# Patient Record
Sex: Male | Born: 1940 | Race: Black or African American | Hispanic: No | Marital: Married | Smoking: Current every day smoker
Health system: Southern US, Community
[De-identification: ages and names within clinical notes are randomized; demographics above are authoritative.]

## PROBLEM LIST (undated history)

## (undated) DIAGNOSIS — N400 Enlarged prostate without lower urinary tract symptoms: Secondary | ICD-10-CM

## (undated) DIAGNOSIS — M519 Unspecified thoracic, thoracolumbar and lumbosacral intervertebral disc disorder: Secondary | ICD-10-CM

## (undated) DIAGNOSIS — R351 Nocturia: Secondary | ICD-10-CM

## (undated) DIAGNOSIS — M1990 Unspecified osteoarthritis, unspecified site: Secondary | ICD-10-CM

## (undated) DIAGNOSIS — G479 Sleep disorder, unspecified: Secondary | ICD-10-CM

## (undated) DIAGNOSIS — R350 Frequency of micturition: Secondary | ICD-10-CM

## (undated) DIAGNOSIS — I2699 Other pulmonary embolism without acute cor pulmonale: Secondary | ICD-10-CM

## (undated) DIAGNOSIS — Z86718 Personal history of other venous thrombosis and embolism: Secondary | ICD-10-CM

## (undated) HISTORY — PX: LUMBAR LAMINECTOMY: SHX95

---

## 2003-09-24 ENCOUNTER — Emergency Department (HOSPITAL_COMMUNITY): Admission: AC | Admit: 2003-09-24 | Discharge: 2003-09-24 | Payer: Self-pay

## 2003-10-09 ENCOUNTER — Inpatient Hospital Stay (HOSPITAL_COMMUNITY)
Admission: RE | Admit: 2003-10-09 | Discharge: 2003-10-19 | Payer: Self-pay | Admitting: Physical Medicine & Rehabilitation

## 2003-10-09 ENCOUNTER — Ambulatory Visit: Payer: Self-pay | Admitting: Physical Medicine & Rehabilitation

## 2008-06-30 ENCOUNTER — Encounter: Admission: RE | Admit: 2008-06-30 | Discharge: 2008-06-30 | Payer: Self-pay | Admitting: Emergency Medicine

## 2008-07-17 ENCOUNTER — Encounter: Admission: RE | Admit: 2008-07-17 | Discharge: 2008-07-17 | Payer: Self-pay | Admitting: Emergency Medicine

## 2010-02-21 ENCOUNTER — Encounter: Payer: Self-pay | Admitting: Emergency Medicine
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
## 2013-03-12 DIAGNOSIS — R269 Unspecified abnormalities of gait and mobility: Secondary | ICD-10-CM | POA: Diagnosis not present

## 2013-03-12 DIAGNOSIS — M47817 Spondylosis without myelopathy or radiculopathy, lumbosacral region: Secondary | ICD-10-CM | POA: Diagnosis not present

## 2013-03-12 DIAGNOSIS — Z6830 Body mass index (BMI) 30.0-30.9, adult: Secondary | ICD-10-CM | POA: Diagnosis not present

## 2013-03-12 DIAGNOSIS — IMO0002 Reserved for concepts with insufficient information to code with codable children: Secondary | ICD-10-CM | POA: Diagnosis not present

## 2013-03-12 DIAGNOSIS — F17200 Nicotine dependence, unspecified, uncomplicated: Secondary | ICD-10-CM | POA: Diagnosis not present

## 2013-03-12 DIAGNOSIS — R350 Frequency of micturition: Secondary | ICD-10-CM | POA: Diagnosis not present

## 2013-03-12 DIAGNOSIS — M25569 Pain in unspecified knee: Secondary | ICD-10-CM | POA: Diagnosis not present

## 2013-03-12 DIAGNOSIS — M1710 Unilateral primary osteoarthritis, unspecified knee: Secondary | ICD-10-CM | POA: Diagnosis not present

## 2013-03-12 DIAGNOSIS — M899 Disorder of bone, unspecified: Secondary | ICD-10-CM | POA: Diagnosis not present

## 2013-03-12 DIAGNOSIS — M25469 Effusion, unspecified knee: Secondary | ICD-10-CM | POA: Diagnosis not present

## 2013-11-10 ENCOUNTER — Emergency Department (HOSPITAL_COMMUNITY)
Admission: EM | Admit: 2013-11-10 | Discharge: 2013-11-11 | Disposition: A | Discharge status: Home / Self Care | Payer: 59 | Attending: Emergency Medicine | Admitting: Emergency Medicine

## 2013-11-10 ENCOUNTER — Encounter (HOSPITAL_COMMUNITY): Payer: Self-pay | Admitting: Emergency Medicine

## 2013-11-10 DIAGNOSIS — Z720 Tobacco use: Secondary | ICD-10-CM | POA: Diagnosis not present

## 2013-11-10 DIAGNOSIS — N453 Epididymo-orchitis: Secondary | ICD-10-CM | POA: Diagnosis not present

## 2013-11-10 DIAGNOSIS — N3001 Acute cystitis with hematuria: Secondary | ICD-10-CM

## 2013-11-10 DIAGNOSIS — N50811 Right testicular pain: Secondary | ICD-10-CM

## 2013-11-10 DIAGNOSIS — N508 Other specified disorders of male genital organs: Secondary | ICD-10-CM | POA: Diagnosis present

## 2013-11-10 LAB — CBC WITH DIFFERENTIAL/PLATELET
BASOS ABS: 0 10*3/uL (ref 0.0–0.1)
BASOS PCT: 1 % (ref 0–1)
EOS ABS: 0.1 10*3/uL (ref 0.0–0.7)
Eosinophils Relative: 3 % (ref 0–5)
HCT: 40.7 % (ref 39.0–52.0)
HEMOGLOBIN: 14.2 g/dL (ref 13.0–17.0)
Lymphocytes Relative: 21 % (ref 12–46)
Lymphs Abs: 0.9 10*3/uL (ref 0.7–4.0)
MCH: 31.8 pg (ref 26.0–34.0)
MCHC: 34.9 g/dL (ref 30.0–36.0)
MCV: 91.1 fL (ref 78.0–100.0)
MONO ABS: 0.9 10*3/uL (ref 0.1–1.0)
Monocytes Relative: 20 % — ABNORMAL HIGH (ref 3–12)
NEUTROS ABS: 2.5 10*3/uL (ref 1.7–7.7)
NEUTROS PCT: 55 % (ref 43–77)
Platelets: DECREASED 10*3/uL (ref 150–400)
RBC: 4.47 MIL/uL (ref 4.22–5.81)
RDW: 13.1 % (ref 11.5–15.5)
WBC: 4.4 10*3/uL (ref 4.0–10.5)

## 2013-11-10 LAB — URINALYSIS, ROUTINE W REFLEX MICROSCOPIC
GLUCOSE, UA: NEGATIVE mg/dL
Ketones, ur: NEGATIVE mg/dL
Nitrite: NEGATIVE
PROTEIN: 100 mg/dL — AB
SPECIFIC GRAVITY, URINE: 1.042 — AB (ref 1.005–1.030)
UROBILINOGEN UA: 1 mg/dL (ref 0.0–1.0)
pH: 5.5 (ref 5.0–8.0)

## 2013-11-10 LAB — URINE MICROSCOPIC-ADD ON

## 2013-11-10 LAB — BASIC METABOLIC PANEL
ANION GAP: 14 (ref 5–15)
BUN: 18 mg/dL (ref 6–23)
CALCIUM: 9.1 mg/dL (ref 8.4–10.5)
CHLORIDE: 98 meq/L (ref 96–112)
CO2: 23 mEq/L (ref 19–32)
CREATININE: 0.93 mg/dL (ref 0.50–1.35)
GFR calc non Af Amer: 81 mL/min — ABNORMAL LOW (ref >90)
Glucose, Bld: 116 mg/dL — ABNORMAL HIGH (ref 70–99)
Potassium: 4.2 mEq/L (ref 3.7–5.3)
Sodium: 135 mEq/L — ABNORMAL LOW (ref 137–147)

## 2013-11-10 NOTE — ED Notes (Signed)
Pt c/o rt testicular pain that began on Wed; pt states that the pain radiates to his rt groin; pt states that it has been difficult to walk due to pain; pt with obvious swelling to rt testicle; tender to palpation

## 2013-11-11 ENCOUNTER — Emergency Department (HOSPITAL_COMMUNITY): Payer: 59

## 2013-11-11 DIAGNOSIS — N453 Epididymo-orchitis: Secondary | ICD-10-CM | POA: Diagnosis not present

## 2013-11-11 MED ORDER — LEVOFLOXACIN 500 MG PO TABS
750.0000 mg | ORAL_TABLET | Freq: Once | ORAL | Status: AC
  Administered 2013-11-11: 750 mg via ORAL
  Filled 2013-11-11: qty 2

## 2013-11-11 MED ORDER — LEVOFLOXACIN 750 MG PO TABS: 750.0000 mg | ORAL_TABLET | Freq: Every day | ORAL | Status: DC

## 2013-11-11 NOTE — Discharge Instructions (Signed)
Urinary Tract Infection [name] [name], you were seen today for testicular pain and swelling.  You have an infection in your urine and scrotum.  Take medication for 10 days for treatment and followup with a primary care physician within 3 days. Return to emergency department if any of her symptoms worsen. Thank you. A urinary tract infection (UTI) can occur any place along the urinary tract. The tract includes the kidneys, ureters, bladder, and urethra. A type of germ called bacteria often causes a UTI. UTIs are often helped with antibiotic medicine.  HOME CARE   If given, take antibiotics as told by your doctor. Finish them even if you start to feel better.  Drink enough fluids to keep your pee (urine) clear or pale yellow.  Avoid tea, drinks with caffeine, and bubbly (carbonated) drinks.  Pee often. Avoid holding your pee in for a long time.  Pee before and after having sex (intercourse).  Wipe from front to back after you poop (bowel movement) if you are a woman. Use each tissue only once. GET HELP RIGHT AWAY IF:   You have back pain.  You have lower belly (abdominal) pain.  You have chills.  You feel sick to your stomach (nauseous).  You throw up (vomit).  Your burning or discomfort with peeing does not go away.  You have a fever.  Your symptoms are not better in 3 days. MAKE SURE YOU:   Understand these instructions.  Will watch your condition.  Will get help right away if you are not doing well or get worse. Document Released: 07/05/2007 Document Revised: 10/11/2011 Document Reviewed: 08/17/2011 [facility] Patient Information 2015 [place], [place]. This information is not intended to replace advice given to you by your health care provider. Make sure you discuss any questions you have with your health care provider. Orchitis Orchitis is an infection of the testicle of usually sudden onset (happens quickly). It may be viral or bacterial (caused by germs). Usually with this  illness there is generalized malaise (not feeling well) and fever. There is also pain. There is usually tenderness and swelling of the scrotum and testicle. DIAGNOSIS  Your caregiver will perform an exam to make sure there is not another reason for the pain in your testicle. A rectal exam may be done to find out if the prostate is swollen and tender. Blood work may be done to see if your white blood cell count is elevated. This can help determine if an infection is viral or bacterial. A urinalysis can also determine what type of infection is present. Most bacterial infections can be treated with antibiotics (medications which kill germs). LET YOUR CAREGIVER KNOW ABOUT:  Allergies.  Medications taken including herbs, eye drops, over the counter medications, and creams.  Use of steroids (by mouth or creams).  Previous problems with anesthetics or novocaine.  Previous prostate infections.  History of blood clots (thrombophlebitis).  History of bleeding or blood problems.  Previous surgery.  Previous urinary tract infection.  Other health problems. HOME CARE INSTRUCTIONS   Apply cold packs to the scrotal area for twenty minutes, four times per day or as needed.  A scrotal support may be helpful. Keep a small pillow or support under your testicles while lying or sitting down.  Only take over-the-counter or prescription medicines for pain, discomfort, or fever as directed by your caregiver.  Take all medications, including antibiotics, as directed. Take the antibiotics for the full prescribed length of time even if you are feeling better. SEEK  IMMEDIATE MEDICAL CARE IF:   Your redness, swelling, or pain in the testicle increases or is not getting better.  You have a fever.  You have pain not relieved with medicines.  You have any worsening of any symptoms (problems) that originally brought you in for medical care. Document Released: 01/14/2000 Document Revised: 04/10/2011 Document  Reviewed: 01/16/2005 [facility] Patient Information 2015 [place], [place]. This information is not intended to replace advice given to you by your health care provider. Make sure you discuss any questions you have with your health care provider. Epididymitis Epididymitis is a swelling (inflammation) of the epididymis. The epididymis is a cord-like structure along the back part of the testicle. Epididymitis is usually, but not always, caused by infection. This is usually a sudden problem beginning with chills, fever and pain behind the scrotum and in the testicle. There may be swelling and redness of the testicle. DIAGNOSIS  Physical examination will reveal a tender, swollen epididymis. Sometimes, cultures are obtained from the urine or from prostate secretions to help find out if there is an infection or if the cause is a different problem. Sometimes, blood work is performed to see if your white blood cell count is elevated and if a germ (bacterial) or viral infection is present. Using this knowledge, an appropriate medicine which kills germs (antibiotic) can be chosen by your caregiver. A viral infection causing epididymitis will most often go away (resolve) without treatment. HOME CARE INSTRUCTIONS   Hot sitz baths for 20 minutes, 4 times per day, may help relieve pain.  Only take over-the-counter or prescription medicines for pain, discomfort or fever as directed by your caregiver.  Take all medicines, including antibiotics, as directed. Take the antibiotics for the full prescribed length of time even if you are feeling better.  It is very important to keep all follow-up appointments. SEEK IMMEDIATE MEDICAL CARE IF:   You have a fever.  You have pain not relieved with medicines.  You have any worsening of your problems.  Your pain seems to come and go.  You develop pain, redness, and swelling in the scrotum and surrounding areas. MAKE SURE YOU:   Understand these instructions.  Will  watch your condition.  Will get help right away if you are not doing well or get worse. Document Released: 01/14/2000 Document Revised: 04/10/2011 Document Reviewed: 12/03/2008 [facility] Patient Information 2015 [place], [place]. This information is not intended to replace advice given to you by your health care provider. Make sure you discuss any questions you have with your health care provider.  Emergency Department Resource Guide 1) Find a Doctor and Pay Out of Pocket Although you won't have to find out who is covered by your insurance plan, it is a good idea to ask around and get recommendations. You will then need to call the office and see if the doctor you have chosen will accept you as a new patient and what types of options they offer for patients who are self-pay. Some doctors offer discounts or will set up payment plans for their patients who do not have insurance, but you will need to ask so you aren't surprised when you get to your appointment.  2) Contact Your Local Health Department Not all health departments have doctors that can see patients for sick visits, but many do, so it is worth a call to see if yours does. If you don't know where your local health department is, you can check in your phone book. The CDC also has  a tool to help you locate your state's health department, and many state websites also have listings of all of their local health departments.  3) Find a [facility] If your illness is not likely to be very severe or complicated, you may want to try a walk in clinic. These are popping up all over the country in pharmacies, drugstores, and shopping centers. They're usually staffed by nurse practitioners or physician assistants that have been trained to treat common illnesses and complaints. They're usually fairly quick and inexpensive. However, if you have serious medical issues or chronic medical problems, these are probably not your best option.  No Primary Care  Doctor: - Call Health Connect at  [phone] - they can help you locate a primary care doctor that  accepts your insurance, provides certain services, etc. - Physician Referral Service- [phone]  Chronic Pain Problems: Organization         Address  Phone   Notes  [facility]  [phone] Patients need to be referred by their primary care doctor.   Medication Assistance: Organization         Address  Phone   Notes  [facility] Medication [facility] [address] [place], [place] [address] [phone] --Must be a resident of [facility] -- Must have NO insurance coverage whatsoever (no Medicaid/ Medicare, etc.) -- The pt. MUST have a primary care doctor that directs their care regularly and follows them in the community   MedAssist  [phone]   [employer]  [phone]    Agencies that provide inexpensive medical care: Organization         Address  Phone   Notes  [place]  [phone]   [facility]    [phone]   [facility] [place], [place] [address] [phone]   [place] [address], [place] [phone]   Planned Parenthood    [phone]   [facility]    [phone]   [place] and [place] [address], [place] Phone:  [phone], Fax:  [phone] Hours of Operation:  9 am - 6 pm, M-F.  Also accepts Medicaid/Medicare and self-pay.  [facility] for [place] [address], [address], [place] Phone: [phone], Fax: [phone]. Hours of Operation:  8:30 am - 5:30 pm, M-F.  Also accepts Medicaid and self-pay.  [facility] [place] [address] [place] Phone: [phone]   [place], [place], [place] [phone], Ext. [phone] Mondays & Thursdays: 7-9 AM.  First 15 patients are seen on a first  come, first serve basis.    [place] Providers:  Organization         Address  Phone   Notes  [facility] [address], [place] [phone] Also accepts self-pay patients.  [facility] [address], [place]  [phone]   [place], [address], [place] [phone]   [facility] [address], [place] [phone]   Lucianne Lei [address], [place]   [phone] Only accepts [place] Access [place] patients after they have their name applied to their card.   Self-Pay (no insurance) in [place]  [place]:  Organization         Address  Phone   Notes  Sickle Cell Patients, [facility] Internal Medicine [place] [phone]   [facility] [place] [phone]   [facility] [place]  [place], [address], [place] [phone]   [facility]/Dr. Osei-Bonsu  [address] [place] or [address], [place] [phone] Phone number for both [place] and [place] locations is the same.  [facility] [address] [place] [phone]   [facility] [address], [place] or [address] [phone]   [facility] [address] [place] [phone], phone; [phone], fax Sees patients 1st and 3rd Saturday of every month.  Must not qualify for public or private insurance (i.e. Medicaid, Medicare, [place] Health Choice, Veterans' Benefits)  Household income should be no more than 200% of the poverty level The clinic cannot treat you if you are pregnant or think you are pregnant  Sexually transmitted diseases are not treated at the clinic.    Dental Care: Organization          Address  Phone  Notes  [facility] Department of [facility] [place] [phone] Accepts children up to age 9 who are enrolled in [place] or [place]; pregnant women with a Medicaid card; and children who have applied for Medicaid or [place] Health Choice, but were declined, whose parents can pay a reduced fee at time of service.  [facility] Department of [facility]  [address], [place] [phone] Accepts children up to age 57 who are enrolled in [place] or [place]; pregnant women with a Medicaid card; and children who have applied for Medicaid or  Health Choice, but were declined, whose parents can pay a reduced fee at time of service.  [place] Adult Dental Access PROGRAM  [place] [phone] Patients are seen by appointment only. Walk-ins are not accepted. [place] will see patients 21 years of age and older. Monday - Tuesday (8am-5pm) Most Wednesdays (8:30-5pm) $30 per visit, cash only  [facility] Adult [facility] Access PRO[facility]  [address], [facility] [phone] Patients are seen by appointment only. Walk-ins are not accepted. [place] will see patients 76 years of age and older. One Wednesday Evening (Monthly: Volunteer Based).  $30 per visit, cash only  [place]  [phone] for adults; Children under age 6, call Graduate Pediatric Dentistry at [phone]. Children aged 7-14, please call [phone] to request a pediatric application.  Dental services are provided in all areas of dental care including fillings, crowns and bridges, complete and partial dentures, implants, gum treatment, root canals, and extractions. Preventive care is also provided. Treatment is provided to both adults and children. Patients are selected via a lottery and there is often a waiting list.   [facility] [place] [address] [place]  [phone] [email or web address]   [facility] [address] [place], [place] [phone], Ext. [phone] Second and Fourth Thursday of each month, opens at 6:30 AM; Clinic ends at 9 AM.  Patients are seen on a first-come first-served basis, and a limited number are seen during each clinic.  [facility]  [address] Hillard Danker [place], [place] [phone]   Eligibility Requirements You must have lived in [place], [place], or [place] counties for at least the last three months.   You cannot be eligible for state or federal sponsored [employer], including [employer], [place], or [employer].   You generally cannot be eligible for healthcare insurance through your employer.    How to apply: Eligibility screenings are held every Tuesday and Wednesday afternoon from 1:00 pm until 4:00 pm. You do not need an appointment for the interview!  [facility] [address] [place], [place]   [place]  [place] Department  Parks  [phone]    Behavioral Health Resources in the Community: Intensive Outpatient Programs Organization         Address  Phone  Notes  [facility] [place]. [address], [place], [place] [phone]   [facility] [address], [place], [place]   ADS: Alcohol & Drug Svcs [address] [place], [place]   [place] [address],  [place], [place] or [phone]   Substance Abuse Resources Organization         Address  Phone  Notes  Alcohol and Drug Services  [phone]   [place]  [phone]   [facility] [place]   Chinita Pester  [phone]   Residential & Outpatient Substance Abuse Program  [phone]   Psychological  Services Organization         Address  Phone  Notes  [facility] [place] [facility]  [place]  [phone]   [place] [address] [place] or [phone]    Mobile Crisis Teams Organization         Address  Phone  Notes  [facility]atives, Mobile Crisis Care Unit  [phone]   [facility]  [address] [place], [place]   [place] Levels [address] [place] [phone]    Self-Help/Support Groups Organization         Address  Phone             Notes  [place]. of [place] - variety of support groups  [place] Call for more information  Narcotics Anonymous (NA), [facility] [address], [employer]  2 meetings at this location   Special educational needs teacher         Address  Phone  Notes  ASAP Residential Treatment [place],    [place]  [phone]   [facility]  [address] [place] [phone], [place], [place]   [place], [place] [phone] Admissions: 8am-3pm M-F  Incentives Substance [place] [address]    [place], [place] [phone]   [facility] [address] Jadene Pierini [place], [place]   [facility] [address]  [place], [place]   [facility] Programs - Intensive Outpatient [address], [place], [place]   [facility] ([place].) [place].,  [place], [place] or [phone]   [facility]idential Treatment Services (RTS) [address] [place], [place] Accepts Medicaid  [facility] [place] [address]  [place] [phone] Substance Abuse/Addiction Treatment  [facility] Organization         Address  Phone  Notes  [facility]  [phone]   Domenic Schwab, PhD [address] [place], [place]   [phone] or [phone]   [place], [place] [phone]   [address], [place], [place] [phone] Insurance/Medicaid/sponsorship through [facility] and Families [address]                                    [place], [place] [phone] [place] [address], [place] [phone]    Dr. Adele Schilder  [phone]   [facility] Dept. 1) [address], [place] 2) [place] 3)  [place] [address], [place] [phone]  [phone]   [place] [phone] or [phone] (After Hours)

## 2013-11-11 NOTE — ED Provider Notes (Signed)
CSN: [id number]     Arrival date & time 11/10/13  2019 History   First MD Initiated Contact with Patient 11/11/13 0426     Chief Complaint  Patient presents with  . Testicle Pain     (Consider location/radiation/quality/duration/timing/severity/associated sxs/prior Treatment) HPI [name] [name] is a 73 y.o. male with no significant past medical history coming in with right testicular pain and swelling. Patient states this began one week ago. It is progressively gotten worse. He says he has hematuria as well. He denies dysuria or change in frequency. He denies any penile discharge. He's had some mild lower abdominal pain as well. Nothing makes his pain better or worse. He has no chest pain or shortness of breath, he denies any fevers. Patient had no recent infections. He has no further complaints.  10 Systems reviewed and are negative for acute change except as noted in the HPI.     History reviewed. No pertinent past medical history. Past Surgical History  Procedure Laterality Date  . Back surgery     No family history on file. History  Substance Use Topics  . Smoking status: Current Every Day Smoker -- 1.50 packs/day    Types: Cigarettes  . Smokeless tobacco: Not on file  . Alcohol Use: No    Review of Systems    Allergies  Review of patient's allergies indicates no known allergies.  Home Medications   Prior to Admission medications   Medication Sig Start Date End Date Taking? Authorizing Provider  ibuprofen (ADVIL,MOTRIN) 200 MG tablet Take 400 mg by mouth every 6 (six) hours as needed for moderate pain.   Yes Historical Provider, MD   BP 138/80  Pulse 85  Temp(Src) 98.4 F (36.9 C) (Oral)  Resp 18  Ht 6' (1.829 m)  Wt 210 lb (95.255 kg)  BMI 28.47 kg/m2  SpO2 97% Physical Exam  Nursing note and vitals reviewed. Constitutional: He is oriented to person, place, and time. Vital signs are normal. He appears well-developed and well-nourished.  Non-toxic  appearance. He does not appear ill. No distress.  HENT:  Head: Normocephalic and atraumatic.  Nose: Nose normal.  Mouth/Throat: Oropharynx is clear and moist. No oropharyngeal exudate.  Eyes: Conjunctivae and EOM are normal. Pupils are equal, round, and reactive to light. No scleral icterus.  Neck: Normal range of motion. Neck supple. No tracheal deviation, no edema, no erythema and normal range of motion present. No mass and no thyromegaly present.  Cardiovascular: Normal rate, regular rhythm, S1 normal, S2 normal, normal heart sounds, intact distal pulses and normal pulses.  Exam reveals no gallop and no friction rub.   No murmur heard. Pulses:      Radial pulses are 2+ on the right side, and 2+ on the left side.       Dorsalis pedis pulses are 2+ on the right side, and 2+ on the left side.  Pulmonary/Chest: Effort normal and breath sounds normal. No respiratory distress. He has no wheezes. He has no rhonchi. He has no rales.  Abdominal: Soft. Normal appearance and bowel sounds are normal. He exhibits no distension, no ascites and no mass. There is no hepatosplenomegaly. There is no tenderness. There is no rebound, no guarding and no CVA tenderness.  No bilateral lower quadrant tenderness to palpation  Genitourinary: Penis normal. No penile tenderness.  Right testicular swelling and tenderness to palpation. It is warm to touch. There is right epididymal tenderness as well. No left-sided testicular swelling or tenderness.  Musculoskeletal: Normal  range of motion. He exhibits no edema and no tenderness.  Lymphadenopathy:    He has no cervical adenopathy.  Neurological: He is alert and oriented to person, place, and time. He has normal strength. No cranial nerve deficit or sensory deficit. He exhibits normal muscle tone. GCS eye subscore is 4. GCS verbal subscore is 5. GCS motor subscore is 6.  Skin: Skin is warm, dry and intact. No petechiae and no rash noted. He is not diaphoretic. No erythema.  No pallor.  Psychiatric: He has a normal mood and affect. His behavior is normal. Judgment normal.    ED Course  Procedures (including critical care time) Labs Review Labs Reviewed  BASIC METABOLIC PANEL - Abnormal; Notable for the following:    Sodium 135 (*)    Glucose, Bld 116 (*)    GFR calc non Af Amer 81 (*)    All other components within normal limits  CBC WITH DIFFERENTIAL - Abnormal; Notable for the following:    Monocytes Relative 20 (*)    All other components within normal limits  URINALYSIS, ROUTINE W REFLEX MICROSCOPIC - Abnormal; Notable for the following:    Color, Urine RED (*)    APPearance TURBID (*)    Specific Gravity, Urine 1.042 (*)    Hgb urine dipstick LARGE (*)    Bilirubin Urine MODERATE (*)    Protein, ur 100 (*)    Leukocytes, UA SMALL (*)    All other components within normal limits  URINE MICROSCOPIC-ADD ON - Abnormal; Notable for the following:    Bacteria, UA MANY (*)    All other components within normal limits    Imaging Review US Scrotum  11/11/2013   CLINICAL DATA:  Right testicle pain initial encounter  EXAM: SCROTAL ULTRASOUND  DOPPLER ULTRASOUND OF THE TESTICLES  TECHNIQUE: Complete ultrasound examination of the testicles, epididymis, and other scrotal structures was performed. Color and spectral Doppler ultrasound were also utilized to evaluate blood flow to the testicles.  COMPARISON:  None.  FINDINGS: Right testicle  Measurements: 4.7 x 3.6 x 3.8 cm. Echotexture is mildly heterogeneous, likely from edema. The testicle is hypervascular. No evidence of abscess. No evidence of mass.  Left testicle  Measurements: 4.4 x 1.9 x 2.7 cm. No mass or microlithiasis visualized.  Right epididymis: Enlarged and heterogeneous with hyperemia. No evidence of abscess.  Left epididymis:  Normal in size and appearance.  Hydrocele:  None visualized.  Varicocele:  None visualized.  Pulsed Doppler interrogation of both testes demonstrates low resistance arterial  and venous waveforms bilaterally. On the right, diastolic flow is increased in the setting of hyperemia.  IMPRESSION: Right epididymo-orchitis.   Electronically Signed   By: Jorje Guild M.D.   On: 11/11/2013 02:11   [place] Art/ven Flow Abd Pelv Doppler  11/11/2013   CLINICAL DATA:  Right testicle pain initial encounter  EXAM: SCROTAL ULTRASOUND  DOPPLER ULTRASOUND OF THE TESTICLES  TECHNIQUE: Complete ultrasound examination of the testicles, epididymis, and other scrotal structures was performed. Color and spectral Doppler ultrasound were also utilized to evaluate blood flow to the testicles.  COMPARISON:  None.  FINDINGS: Right testicle  Measurements: 4.7 x 3.6 x 3.8 cm. Echotexture is mildly heterogeneous, likely from edema. The testicle is hypervascular. No evidence of abscess. No evidence of mass.  Left testicle  Measurements: 4.4 x 1.9 x 2.7 cm. No mass or microlithiasis visualized.  Right epididymis: Enlarged and heterogeneous with hyperemia. No evidence of abscess.  Left epididymis:  Normal in size  and appearance.  Hydrocele:  None visualized.  Varicocele:  None visualized.  Pulsed Doppler interrogation of both testes demonstrates low resistance arterial and venous waveforms bilaterally. On the right, diastolic flow is increased in the setting of hyperemia.  IMPRESSION: Right epididymo-orchitis.   Electronically Signed   By: Jorje Guild M.D.   On: 11/11/2013 02:11     EKG Interpretation None      MDM   Final diagnoses:  Pain in right testicle    Patient presents emergency department for pain and swelling of his right testicle. Ultrasound reveals epididymal orchitis, urinalysis also reveals a urinary tract infection with hematuria. We'll treat for both. Patient was given Levaquin 7:15 emergency department. We'll discharge the ten-day course. His vital signs remain within his normal limits is safe for discharge.    Everlene Balls, MD 11/11/13 [phone]

## 2013-11-20 DIAGNOSIS — N451 Epididymitis: Secondary | ICD-10-CM | POA: Diagnosis not present

## 2014-01-08 DIAGNOSIS — N451 Epididymitis: Secondary | ICD-10-CM | POA: Diagnosis not present

## 2014-01-22 ENCOUNTER — Inpatient Hospital Stay (HOSPITAL_COMMUNITY): Payer: 59 | Admitting: Anesthesiology

## 2014-01-22 ENCOUNTER — Inpatient Hospital Stay (HOSPITAL_COMMUNITY)
Admission: EM | Admit: 2014-01-22 | Discharge: 2014-01-26 | DRG: 482 | Disposition: A | Discharge status: Skilled Nursing Facility | Payer: 59 | Attending: Internal Medicine | Admitting: Internal Medicine

## 2014-01-22 ENCOUNTER — Encounter (HOSPITAL_COMMUNITY)
Admission: EM | Disposition: A | Discharge status: Skilled Nursing Facility | Payer: Self-pay | Source: Home / Self Care | Attending: Internal Medicine

## 2014-01-22 ENCOUNTER — Encounter (HOSPITAL_COMMUNITY): Payer: Self-pay | Admitting: Emergency Medicine

## 2014-01-22 ENCOUNTER — Inpatient Hospital Stay (HOSPITAL_COMMUNITY): Payer: 59

## 2014-01-22 ENCOUNTER — Emergency Department (HOSPITAL_COMMUNITY): Payer: 59

## 2014-01-22 DIAGNOSIS — S72146A Nondisplaced intertrochanteric fracture of unspecified femur, initial encounter for closed fracture: Secondary | ICD-10-CM | POA: Diagnosis not present

## 2014-01-22 DIAGNOSIS — R338 Other retention of urine: Secondary | ICD-10-CM | POA: Diagnosis not present

## 2014-01-22 DIAGNOSIS — Z8781 Personal history of (healed) traumatic fracture: Secondary | ICD-10-CM

## 2014-01-22 DIAGNOSIS — S72009A Fracture of unspecified part of neck of unspecified femur, initial encounter for closed fracture: Secondary | ICD-10-CM | POA: Diagnosis present

## 2014-01-22 DIAGNOSIS — S72142A Displaced intertrochanteric fracture of left femur, initial encounter for closed fracture: Secondary | ICD-10-CM | POA: Diagnosis not present

## 2014-01-22 DIAGNOSIS — R52 Pain, unspecified: Secondary | ICD-10-CM | POA: Diagnosis not present

## 2014-01-22 DIAGNOSIS — F17210 Nicotine dependence, cigarettes, uncomplicated: Secondary | ICD-10-CM | POA: Diagnosis present

## 2014-01-22 DIAGNOSIS — W19XXXA Unspecified fall, initial encounter: Secondary | ICD-10-CM | POA: Diagnosis present

## 2014-01-22 DIAGNOSIS — S72002A Fracture of unspecified part of neck of left femur, initial encounter for closed fracture: Secondary | ICD-10-CM | POA: Diagnosis present

## 2014-01-22 DIAGNOSIS — Z9889 Other specified postprocedural states: Secondary | ICD-10-CM

## 2014-01-22 DIAGNOSIS — M25559 Pain in unspecified hip: Secondary | ICD-10-CM | POA: Diagnosis not present

## 2014-01-22 DIAGNOSIS — Z419 Encounter for procedure for purposes other than remedying health state, unspecified: Secondary | ICD-10-CM

## 2014-01-22 DIAGNOSIS — N400 Enlarged prostate without lower urinary tract symptoms: Secondary | ICD-10-CM | POA: Diagnosis present

## 2014-01-22 DIAGNOSIS — R339 Retention of urine, unspecified: Secondary | ICD-10-CM | POA: Diagnosis not present

## 2014-01-22 DIAGNOSIS — N401 Enlarged prostate with lower urinary tract symptoms: Secondary | ICD-10-CM | POA: Diagnosis present

## 2014-01-22 DIAGNOSIS — M25552 Pain in left hip: Secondary | ICD-10-CM | POA: Diagnosis not present

## 2014-01-22 DIAGNOSIS — S72143A Displaced intertrochanteric fracture of unspecified femur, initial encounter for closed fracture: Secondary | ICD-10-CM | POA: Diagnosis not present

## 2014-01-22 HISTORY — PX: INTRAMEDULLARY (IM) NAIL INTERTROCHANTERIC: SHX5875

## 2014-01-22 LAB — CBC
HCT: 35.4 % — ABNORMAL LOW (ref 39.0–52.0)
HEMOGLOBIN: 12 g/dL — AB (ref 13.0–17.0)
MCH: 31.3 pg (ref 26.0–34.0)
MCHC: 33.9 g/dL (ref 30.0–36.0)
MCV: 92.4 fL (ref 78.0–100.0)
Platelets: UNDETERMINED 10*3/uL (ref 150–400)
RBC: 3.83 MIL/uL — AB (ref 4.22–5.81)
RDW: 14.2 % (ref 11.5–15.5)
WBC: 5.5 10*3/uL (ref 4.0–10.5)

## 2014-01-22 LAB — CBC WITH DIFFERENTIAL/PLATELET
BASOS ABS: 0 10*3/uL (ref 0.0–0.1)
BASOS PCT: 0 % (ref 0–1)
Eosinophils Absolute: 0 10*3/uL (ref 0.0–0.7)
Eosinophils Relative: 0 % (ref 0–5)
HEMATOCRIT: 39.5 % (ref 39.0–52.0)
HEMOGLOBIN: 13.5 g/dL (ref 13.0–17.0)
LYMPHS ABS: 0.8 10*3/uL (ref 0.7–4.0)
Lymphocytes Relative: 8 % — ABNORMAL LOW (ref 12–46)
MCH: 31.7 pg (ref 26.0–34.0)
MCHC: 34.2 g/dL (ref 30.0–36.0)
MCV: 92.7 fL (ref 78.0–100.0)
MONO ABS: 0.8 10*3/uL (ref 0.1–1.0)
Monocytes Relative: 8 % (ref 3–12)
NEUTROS ABS: 8 10*3/uL — AB (ref 1.7–7.7)
Neutrophils Relative %: 84 % — ABNORMAL HIGH (ref 43–77)
Platelets: UNDETERMINED 10*3/uL (ref 150–400)
RBC: 4.26 MIL/uL (ref 4.22–5.81)
RDW: 14.2 % (ref 11.5–15.5)
WBC: 9.6 10*3/uL (ref 4.0–10.5)

## 2014-01-22 LAB — COMPREHENSIVE METABOLIC PANEL
ALBUMIN: 3.8 g/dL (ref 3.5–5.2)
ALK PHOS: 49 U/L (ref 39–117)
ALT: 13 U/L (ref 0–53)
ANION GAP: 7 (ref 5–15)
AST: 15 U/L (ref 0–37)
BUN: 14 mg/dL (ref 6–23)
CALCIUM: 8.8 mg/dL (ref 8.4–10.5)
CO2: 28 mmol/L (ref 19–32)
CREATININE: 0.89 mg/dL (ref 0.50–1.35)
Chloride: 101 mEq/L (ref 96–112)
GFR calc non Af Amer: 82 mL/min — ABNORMAL LOW (ref >90)
GLUCOSE: 115 mg/dL — AB (ref 70–99)
Potassium: 3.8 mmol/L (ref 3.5–5.1)
Sodium: 136 mmol/L (ref 135–145)
TOTAL PROTEIN: 7.5 g/dL (ref 6.0–8.3)
Total Bilirubin: 0.4 mg/dL (ref 0.3–1.2)

## 2014-01-22 LAB — TYPE AND SCREEN
ABO/RH(D): A POS
Antibody Screen: NEGATIVE

## 2014-01-22 LAB — I-STAT CHEM 8, ED
BUN: 13 mg/dL (ref 6–23)
CREATININE: 0.9 mg/dL (ref 0.50–1.35)
Calcium, Ion: 1.17 mmol/L (ref 1.13–1.30)
Chloride: 101 mEq/L (ref 96–112)
GLUCOSE: 112 mg/dL — AB (ref 70–99)
HCT: 42 % (ref 39.0–52.0)
Hemoglobin: 14.3 g/dL (ref 13.0–17.0)
Potassium: 3.8 mmol/L (ref 3.5–5.1)
SODIUM: 139 mmol/L (ref 135–145)
TCO2: 23 mmol/L (ref 0–100)

## 2014-01-22 LAB — CREATININE, SERUM
Creatinine, Ser: 0.94 mg/dL (ref 0.50–1.35)
GFR calc Af Amer: >90 mL/min (ref >90)
GFR calc non Af Amer: 80 mL/min — ABNORMAL LOW (ref >90)

## 2014-01-22 LAB — ABO/RH: ABO/RH(D): A POS

## 2014-01-22 LAB — PROTIME-INR
INR: 1.03 (ref 0.00–1.49)
PROTHROMBIN TIME: 13.6 s (ref 11.6–15.2)

## 2014-01-22 SURGERY — FIXATION, FRACTURE, INTERTROCHANTERIC, WITH INTRAMEDULLARY ROD
Anesthesia: General | Site: Leg Upper | Laterality: Left

## 2014-01-22 MED ORDER — MORPHINE SULFATE 2 MG/ML IJ SOLN
0.5000 mg | INTRAMUSCULAR | Status: DC | PRN
  Filled 2014-01-22 (×2): qty 1

## 2014-01-22 MED ORDER — LIDOCAINE HCL 4 % MT SOLN
OROMUCOSAL | Status: DC | PRN
  Administered 2014-01-22: 4 mL via TOPICAL

## 2014-01-22 MED ORDER — 0.9 % SODIUM CHLORIDE (POUR BTL) OPTIME
TOPICAL | Status: DC | PRN
  Administered 2014-01-22: 1000 mL

## 2014-01-22 MED ORDER — PHENOL 1.4 % MT LIQD: 1.0000 | OROMUCOSAL | Status: DC | PRN

## 2014-01-22 MED ORDER — TAMSULOSIN HCL 0.4 MG PO CAPS
0.4000 mg | ORAL_CAPSULE | Freq: Every day | ORAL | Status: DC
Start: 2014-01-22 — End: 2014-01-23
  Administered 2014-01-22 – 2014-01-23 (×2): 0.4 mg via ORAL
  Filled 2014-01-22 (×2): qty 1

## 2014-01-22 MED ORDER — FERROUS SULFATE 325 (65 FE) MG PO TABS
325.0000 mg | ORAL_TABLET | Freq: Three times a day (TID) | ORAL | Status: DC
  Administered 2014-01-22 – 2014-01-26 (×11): 325 mg via ORAL
  Filled 2014-01-22 (×14): qty 1

## 2014-01-22 MED ORDER — HYDROMORPHONE HCL 1 MG/ML IJ SOLN
0.2500 mg | INTRAMUSCULAR | Status: DC | PRN
  Administered 2014-01-22: 0.5 mg via INTRAVENOUS

## 2014-01-22 MED ORDER — MORPHINE SULFATE 2 MG/ML IJ SOLN
0.5000 mg | INTRAMUSCULAR | Status: DC | PRN
  Administered 2014-01-24 (×2): 0.5 mg via INTRAVENOUS

## 2014-01-22 MED ORDER — LACTATED RINGERS IV SOLN
INTRAVENOUS | Status: DC
  Administered 2014-01-22 (×3): via INTRAVENOUS

## 2014-01-22 MED ORDER — ONDANSETRON HCL 4 MG PO TABS: 4.0000 mg | ORAL_TABLET | Freq: Four times a day (QID) | ORAL | Status: DC | PRN

## 2014-01-22 MED ORDER — METHOCARBAMOL 1000 MG/10ML IJ SOLN: 500.0000 mg | Freq: Four times a day (QID) | INTRAVENOUS | Status: DC | PRN

## 2014-01-22 MED ORDER — ACETAMINOPHEN 650 MG RE SUPP: 650.0000 mg | Freq: Four times a day (QID) | RECTAL | Status: DC | PRN

## 2014-01-22 MED ORDER — HYDROMORPHONE HCL 1 MG/ML IJ SOLN
1.0000 mg | Freq: Once | INTRAMUSCULAR | Status: AC
  Administered 2014-01-22: 1 mg via INTRAVENOUS
  Filled 2014-01-22: qty 1

## 2014-01-22 MED ORDER — HYDROMORPHONE HCL 1 MG/ML IJ SOLN
INTRAMUSCULAR | Status: AC
  Filled 2014-01-22: qty 1

## 2014-01-22 MED ORDER — NEOSTIGMINE METHYLSULFATE 10 MG/10ML IV SOLN
INTRAVENOUS | Status: DC | PRN
  Administered 2014-01-22: 3 mg via INTRAVENOUS

## 2014-01-22 MED ORDER — ENOXAPARIN SODIUM 30 MG/0.3ML ~~LOC~~ SOLN
30.0000 mg | SUBCUTANEOUS | Status: DC
  Administered 2014-01-23: 30 mg via SUBCUTANEOUS
  Filled 2014-01-22 (×2): qty 0.3

## 2014-01-22 MED ORDER — PROPOFOL 10 MG/ML IV BOLUS
INTRAVENOUS | Status: DC | PRN
  Administered 2014-01-22: 200 mg via INTRAVENOUS
  Administered 2014-01-22: 50 mg via INTRAVENOUS

## 2014-01-22 MED ORDER — CEFAZOLIN SODIUM-DEXTROSE 2-3 GM-% IV SOLR
INTRAVENOUS | Status: DC | PRN
  Administered 2014-01-22: 2 g via INTRAVENOUS

## 2014-01-22 MED ORDER — LACTATED RINGERS IV SOLN
INTRAVENOUS | Status: DC
  Administered 2014-01-22: 22:00:00 via INTRAVENOUS

## 2014-01-22 MED ORDER — METHOCARBAMOL 500 MG PO TABS
500.0000 mg | ORAL_TABLET | Freq: Four times a day (QID) | ORAL | Status: DC | PRN
  Administered 2014-01-23 – 2014-01-26 (×7): 500 mg via ORAL
  Filled 2014-01-22 (×8): qty 1

## 2014-01-22 MED ORDER — OXYCODONE HCL 5 MG PO TABS
5.0000 mg | ORAL_TABLET | ORAL | Status: DC | PRN
  Administered 2014-01-23 – 2014-01-26 (×8): 10 mg via ORAL
  Filled 2014-01-22 (×8): qty 2

## 2014-01-22 MED ORDER — METHOCARBAMOL 1000 MG/10ML IJ SOLN
500.0000 mg | Freq: Four times a day (QID) | INTRAVENOUS | Status: DC | PRN
  Administered 2014-01-22: 500 mg via INTRAVENOUS
  Filled 2014-01-22 (×2): qty 5

## 2014-01-22 MED ORDER — CEFAZOLIN SODIUM-DEXTROSE 2-3 GM-% IV SOLR
2.0000 g | Freq: Four times a day (QID) | INTRAVENOUS | Status: AC
  Administered 2014-01-22 – 2014-01-23 (×2): 2 g via INTRAVENOUS
  Filled 2014-01-22 (×2): qty 50

## 2014-01-22 MED ORDER — METOCLOPRAMIDE HCL 5 MG/ML IJ SOLN: 5.0000 mg | Freq: Three times a day (TID) | INTRAMUSCULAR | Status: DC | PRN

## 2014-01-22 MED ORDER — ROCURONIUM BROMIDE 100 MG/10ML IV SOLN
INTRAVENOUS | Status: DC | PRN
  Administered 2014-01-22: 40 mg via INTRAVENOUS

## 2014-01-22 MED ORDER — DOCUSATE SODIUM 100 MG PO CAPS
100.0000 mg | ORAL_CAPSULE | Freq: Two times a day (BID) | ORAL | Status: DC
  Administered 2014-01-22 – 2014-01-26 (×8): 100 mg via ORAL
  Filled 2014-01-22 (×9): qty 1

## 2014-01-22 MED ORDER — BISACODYL 10 MG RE SUPP
10.0000 mg | Freq: Every day | RECTAL | Status: DC | PRN
  Filled 2014-01-22: qty 1

## 2014-01-22 MED ORDER — LIDOCAINE HCL (CARDIAC) 20 MG/ML IV SOLN
INTRAVENOUS | Status: DC | PRN
  Administered 2014-01-22: 80 mg via INTRAVENOUS

## 2014-01-22 MED ORDER — FENTANYL CITRATE 0.05 MG/ML IJ SOLN
INTRAMUSCULAR | Status: DC | PRN
  Administered 2014-01-22: 50 ug via INTRAVENOUS
  Administered 2014-01-22: 100 ug via INTRAVENOUS

## 2014-01-22 MED ORDER — MAGNESIUM CITRATE PO SOLN: 1.0000 | Freq: Once | ORAL | Status: AC | PRN

## 2014-01-22 MED ORDER — DOCUSATE SODIUM 100 MG PO CAPS: 100.0000 mg | ORAL_CAPSULE | Freq: Two times a day (BID) | ORAL | Status: DC

## 2014-01-22 MED ORDER — PHENYLEPHRINE HCL 10 MG/ML IJ SOLN
INTRAMUSCULAR | Status: DC | PRN
Start: 2014-01-22 — End: 2014-01-22
  Administered 2014-01-22 (×3): 120 ug via INTRAVENOUS

## 2014-01-22 MED ORDER — GLYCOPYRROLATE 0.2 MG/ML IJ SOLN
INTRAMUSCULAR | Status: DC | PRN
  Administered 2014-01-22: 0.4 mg via INTRAVENOUS

## 2014-01-22 MED ORDER — ONDANSETRON HCL 4 MG/2ML IJ SOLN
INTRAMUSCULAR | Status: DC | PRN
Start: 2014-01-22 — End: 2014-01-22
  Administered 2014-01-22: 4 mg via INTRAVENOUS

## 2014-01-22 MED ORDER — ONDANSETRON HCL 4 MG/2ML IJ SOLN: 4.0000 mg | Freq: Four times a day (QID) | INTRAMUSCULAR | Status: DC | PRN

## 2014-01-22 MED ORDER — MENTHOL 3 MG MT LOZG: 1.0000 | LOZENGE | OROMUCOSAL | Status: DC | PRN

## 2014-01-22 MED ORDER — SODIUM CHLORIDE 0.9 % IV SOLN: INTRAVENOUS | Status: AC

## 2014-01-22 MED ORDER — ACETAMINOPHEN 325 MG PO TABS
650.0000 mg | ORAL_TABLET | Freq: Four times a day (QID) | ORAL | Status: DC | PRN
  Administered 2014-01-26: 650 mg via ORAL
  Filled 2014-01-22: qty 2

## 2014-01-22 MED ORDER — HYDROCODONE-ACETAMINOPHEN 5-325 MG PO TABS
1.0000 | ORAL_TABLET | Freq: Four times a day (QID) | ORAL | Status: DC | PRN
  Administered 2014-01-22: 1 via ORAL
  Administered 2014-01-23 (×2): 2 via ORAL
  Administered 2014-01-23: 1 via ORAL
  Administered 2014-01-24 – 2014-01-26 (×5): 2 via ORAL
  Filled 2014-01-22 (×3): qty 2
  Filled 2014-01-22: qty 1
  Filled 2014-01-22 (×3): qty 2
  Filled 2014-01-22: qty 1
  Filled 2014-01-22: qty 2

## 2014-01-22 MED ORDER — METHOCARBAMOL 500 MG PO TABS: 500.0000 mg | ORAL_TABLET | Freq: Four times a day (QID) | ORAL | Status: DC | PRN

## 2014-01-22 MED ORDER — METOCLOPRAMIDE HCL 10 MG PO TABS: 5.0000 mg | ORAL_TABLET | Freq: Three times a day (TID) | ORAL | Status: DC | PRN

## 2014-01-22 SURGICAL SUPPLY — 41 items
BIT DRILL CANN LG 4.3MM (BIT) IMPLANT
COVER PERINEAL POST (MISCELLANEOUS) ×3 IMPLANT
COVER SURGICAL LIGHT HANDLE (MISCELLANEOUS) ×3 IMPLANT
DRAPE INCISE IOBAN 66X45 STRL (DRAPES) ×2 IMPLANT
DRAPE STERI IOBAN 125X83 (DRAPES) ×3 IMPLANT
DRAPE U-SHAPE 47X51 STRL (DRAPES) ×6 IMPLANT
DRILL BIT CANN LG 4.3MM (BIT) ×3
DRSG MEPILEX BORDER 4X4 (GAUZE/BANDAGES/DRESSINGS) ×2 IMPLANT
DRSG MEPILEX BORDER 4X8 (GAUZE/BANDAGES/DRESSINGS) ×3 IMPLANT
ELECT PENCIL ROCKER SW 15FT (MISCELLANEOUS) ×3 IMPLANT
ELECT REM PT RETURN 9FT ADLT (ELECTROSURGICAL) ×3
ELECTRODE REM PT RTRN 9FT ADLT (ELECTROSURGICAL) ×1 IMPLANT
GLOVE BIOGEL PI IND STRL 8 (GLOVE) ×1 IMPLANT
GLOVE BIOGEL PI IND STRL 8.5 (GLOVE) ×1 IMPLANT
GLOVE BIOGEL PI INDICATOR 8 (GLOVE) ×2
GLOVE BIOGEL PI INDICATOR 8.5 (GLOVE) ×2
GLOVE ECLIPSE 8.5 STRL (GLOVE) ×3 IMPLANT
GLOVE ORTHO TXT STRL SZ7.5 (GLOVE) ×3 IMPLANT
GOWN STRL REUS W/ TWL LRG LVL3 (GOWN DISPOSABLE) ×1 IMPLANT
GOWN STRL REUS W/TWL 2XL LVL3 (GOWN DISPOSABLE) ×4 IMPLANT
GOWN STRL REUS W/TWL LRG LVL3 (GOWN DISPOSABLE) ×3
GUIDEPIN 3.2X17.5 THRD DISP (PIN) ×6 IMPLANT
HFN LAG SCREW 10.5MM X 115MM (Orthopedic Implant) ×2 IMPLANT
KIT BASIN OR (CUSTOM PROCEDURE TRAY) ×3 IMPLANT
KIT ROOM TURNOVER OR (KITS) ×3 IMPLANT
LINER BOOT UNIVERSAL DISP (MISCELLANEOUS) ×1 IMPLANT
MANIFOLD NEPTUNE II (INSTRUMENTS) ×1 IMPLANT
NAIL HIP FRACT 130D 11X180 (Screw) ×2 IMPLANT
NS IRRIG 1000ML POUR BTL (IV SOLUTION) ×3 IMPLANT
PACK GENERAL/GYN (CUSTOM PROCEDURE TRAY) ×3 IMPLANT
PAD ARMBOARD 7.5X6 YLW CONV (MISCELLANEOUS) ×6 IMPLANT
SCREW BONE CORTICAL 5.0X42 (Screw) ×2 IMPLANT
SCREW BONE CORTICAL 5.0X44 (Screw) ×2 IMPLANT
STAPLER VISISTAT 35W (STAPLE) ×6 IMPLANT
SURGIFLO TRUKIT (HEMOSTASIS) IMPLANT
SUT BONE WAX W31G (SUTURE) ×3 IMPLANT
SUT VIC AB 1 CT1 18XCR BRD 8 (SUTURE) IMPLANT
SUT VIC AB 1 CT1 8-18 (SUTURE) ×3
SUT VIC AB 2-0 CT1 18 (SUTURE) ×2 IMPLANT
SUT VIC AB 2-0 CTB1 (SUTURE) ×2 IMPLANT
WATER STERILE IRR 1000ML POUR (IV SOLUTION) ×2 IMPLANT

## 2014-01-22 NOTE — ED Notes (Signed)
Pt from home via [facility] c/o a fall and left hip pain. NO obvious deformity and no shortening. 100 mcg of fentanyl given IV vis 18 G. Pt alert and oriented No LOC and did not hit head.

## 2014-01-22 NOTE — Anesthesia Preprocedure Evaluation (Addendum)
Anesthesia Evaluation  Patient identified by MRN, date of birth, ID band Patient awake    Airway        Dental   Pulmonary Current Smoker,          Cardiovascular     Neuro/Psych    GI/Hepatic   Endo/Other    Renal/GU      Musculoskeletal   Abdominal   Peds  Hematology   Anesthesia Other Findings   Reproductive/Obstetrics                            Anesthesia Physical Anesthesia Plan  ASA: III  Anesthesia Plan: General   Post-op Pain Management:    Induction:   Airway Management Planned:   Additional Equipment:   Intra-op Plan:   Post-operative Plan:   Informed Consent:   Plan Discussed with:   Anesthesia Plan Comments:         Anesthesia Quick Evaluation

## 2014-01-22 NOTE — Transfer of Care (Signed)
Immediate Anesthesia Transfer of Care Note  Patient: [name] [name]  Procedure(s) Performed: Procedure(s): INTRAMEDULLARY (IM) NAIL INTERTROCHANTRIC (Left)  Patient Location: PACU  Anesthesia Type:General  Level of Consciousness: awake, patient cooperative and responds to stimulation  Airway & Oxygen Therapy: Patient Spontanous Breathing and Patient connected to nasal cannula oxygen  Post-op Assessment: Report given to PACU RN and Post -op Vital signs reviewed and stable  Post vital signs: Reviewed and stable  Complications: No apparent anesthesia complications

## 2014-01-22 NOTE — Progress Notes (Signed)
73 year old male with  No significant past medical history, had a mechanical fall and had a comminuted left intertrochanteric fracture of the femur. EDP consulted orthopedics and Dr Rolena Infante to take him for surgery ASAP. He will need admission orders to med surg.   Hosie Poisson, MD [phone]

## 2014-01-22 NOTE — Brief Op Note (Signed)
01/22/2014  5:42 PM  PATIENT:  Dorina Hoyer  73 y.o. male  PRE-OPERATIVE DIAGNOSIS:  Left Femur Fracture  POST-OPERATIVE DIAGNOSIS:  Left Femur Fracture  PROCEDURE:  Procedure(s): INTRAMEDULLARY (IM) NAIL INTERTROCHANTRIC (Left)  SURGEON:  Surgeon(s) and Role:    * Melina Schools, MD - Primary  PHYSICIAN ASSISTANT:   ASSISTANTS: none   ANESTHESIA:   general  EBL:  Total I/O In: -  Out: 100 [Blood:100]  BLOOD ADMINISTERED:none  DRAINS: none   LOCAL MEDICATIONS USED:  MARCAINE     SPECIMEN:  No Specimen  DISPOSITION OF SPECIMEN:  N/A  COUNTS:  YES  TOURNIQUET:  * No tourniquets in log *  DICTATION: .Other Dictation: Dictation Number [id number]  PLAN OF CARE: Admit to inpatient   PATIENT DISPOSITION:  PACU - hemodynamically stable.

## 2014-01-22 NOTE — ED Notes (Signed)
Bed: WA09 Expected date: 01/22/14 Expected time:  Means of arrival:  Comments: No monitor

## 2014-01-22 NOTE — H&P (Signed)
History and Physical       Hospital Admission Note Date: 01/22/2014  Patient name: [name] [name] Medical record number: [id number] Date of birth: 1940-12-23 Age: 73 y.o. Gender: male  PCP: No primary care provider on file.    Chief Complaint:  Fall with left hip fracture  HPI: Patient is a 73 year old male with BPH presented [facility] ED at [facility] with mechanical fall and left hip fracture. Patient was seen by orthopedics and recommended transfer to [facility] for left hip surgery. Patient was seen in the [facility] perioperative area prior to the surgery. History was obtained from the patient who reported that he was coming down the stairs at home when he accidentally missed a step and landed on his left hip. Patient denied any chest pain, shortness of breath, dizziness or any syncopal episode. X-ray of the left hip showed comminuted left intertrochanteric femur fracture. Orthopedics recommended medical admission and will need surgery for the hip fracture repair.  Review of Systems:  Constitutional: Denies fever, chills, diaphoresis, poor appetite and fatigue.  HEENT: Denies photophobia, eye pain, redness, hearing loss, ear pain, congestion, sore throat, rhinorrhea, sneezing, mouth sores, trouble swallowing, neck pain, neck stiffness and tinnitus.   Respiratory: Denies SOB, DOE, cough, chest tightness,  and wheezing.   Cardiovascular: Denies chest pain, palpitations and leg swelling.  Gastrointestinal: Denies nausea, vomiting, abdominal pain, diarrhea, constipation, blood in stool and abdominal distention.  Genitourinary: Denies dysuria, urgency, frequency, hematuria, flank pain and difficulty urinating.  Musculoskeletal: Denies myalgias, back pain, joint swelling, arthralgias and gait problem, please see history of present illness Skin: Denies pallor, rash and wound.  Neurological: Denies dizziness, seizures, syncope, weakness,  light-headedness, numbness and headaches.  Hematological: Denies adenopathy. Easy bruising, personal or family bleeding history  Psychiatric/Behavioral: Denies suicidal ideation, mood changes, confusion, nervousness, sleep disturbance and agitation  Past Medical History: History reviewed. No pertinent past medical history. Past Surgical History  Procedure Laterality Date  . Back surgery      Medications: Prior to Admission medications   Medication Sig Start Date End Date Taking? Authorizing Provider  tamsulosin (FLOMAX) 0.4 MG CAPS capsule Take 0.4 mg by mouth daily.   Yes Historical Provider, MD  levofloxacin (LEVAQUIN) 750 MG tablet Take 1 tablet (750 mg total) by mouth daily. Patient not taking: Reported on 01/22/2014 11/11/13   Everlene Balls, MD    Allergies:  No Known Allergies  Social History:  reports that he has been smoking Cigarettes.  He has been smoking about 1.50 packs per day. He does not have any smokeless tobacco history on file. He reports that he does not drink alcohol. His drug history is not on file.  Family History: No family history on file.  Physical Exam: Blood pressure 129/70, pulse 84, temperature 97.6 F (36.4 C), temperature source Oral, resp. rate 18, SpO2 97 %. General: Alert, awake, oriented x3, in no acute distress. HEENT: normocephalic, atraumatic, anicteric sclera, pink conjunctiva, pupils equal and reactive to light and accomodation, oropharynx clear Neck: supple, no masses or lymphadenopathy, no goiter, no bruits  Heart: Regular rate and rhythm, without murmurs, rubs or gallops. Lungs: Clear to auscultation bilaterally, no wheezing, rales or rhonchi. Abdomen: Soft, nontender, nondistended, positive bowel sounds, no masses. Extremities: No clubbing, cyanosis or edema with positive pedal pulses. Neuro: Grossly intact, no focal neurological deficits  Psych: alert and oriented x 3, normal mood and affect Skin: no rashes or lesions, warm and  dry   LABS on Admission:  Basic Metabolic  Panel:  Recent Labs Lab 01/22/14 1321 01/22/14 1429  NA 136 139  K 3.8 3.8  CL 101 101  CO2 28  --   GLUCOSE 115* 112*  BUN 14 13  CREATININE 0.89 0.90  CALCIUM 8.8  --    Liver Function Tests:  Recent Labs Lab 01/22/14 1321  AST 15  ALT 13  ALKPHOS 49  BILITOT 0.4  PROT 7.5  ALBUMIN 3.8   No results for input(s): LIPASE, AMYLASE in the last 168 hours. No results for input(s): AMMONIA in the last 168 hours. CBC:  Recent Labs Lab 01/22/14 1321 01/22/14 1429  WBC 9.6  --   NEUTROABS 8.0*  --   HGB 13.5 14.3  HCT 39.5 42.0  MCV 92.7  --   PLT PLATELET CLUMPS NOTED ON SMEAR, UNABLE TO ESTIMATE  --    Cardiac Enzymes: No results for input(s): CKTOTAL, CKMB, CKMBINDEX, TROPONINI in the last 168 hours. BNP: Invalid input(s): POCBNP CBG: No results for input(s): GLUCAP in the last 168 hours.   Radiological Exams on Admission: Dg Chest 1 View  01/22/2014   CLINICAL DATA:  Fall, left hip pain  EXAM: CHEST - 1 VIEW  COMPARISON:  CT chest dated 07/17/2008  FINDINGS: Lungs are essentially clear. Biapical pleural parenchymal scarring. No focal consolidation. No pleural effusion or pneumothorax.  The heart is normal in size.  Degenerative changes of the visualized thoracolumbar spine.  IMPRESSION: No evidence of acute cardiopulmonary disease.   Electronically Signed   By: Julian Hy M.D.   On: 01/22/2014 12:34   Dg Hip Complete Left  01/22/2014   CLINICAL DATA:  Fall  EXAM: LEFT HIP - COMPLETE 2+ VIEW  COMPARISON:  None.  FINDINGS: Comminuted left intertrochanteric femur fracture with some deformity is present. Lesser trochanter is somewhat displaced. Vascular calcifications are noted. Osteopenia. Degenerative changes in the lumbar spine and right hip joint are noted.  IMPRESSION: Comminuted left intertrochanteric femur fracture.   Electronically Signed   By: Maryclare Bean M.D.   On: 01/22/2014 12:38     Assessment/Plan Principal Problem:   Hip fracture, left secondary to mechanical fall - Admit to medicine, patient has no medical problems except BPH, no cardiac symptoms, had mechanical fall leading to left hip fracture. - Patient is cleared for surgery, keep NPO, pain control, gentle IV fluid hydration - Hold the Lovenox tonight for surgery, can be restarted tomorrow after 24 hours - Orthopedics following, Dr. Rolena Infante, recommending surgery tonight   Active Problems:   BPH (benign prostatic hypertrophy) - Continue Flomax   DVT prophylaxis: SCDs for now, start Lovenox tomorrow  CODE STATUS: Full code  Family Communication: Admission, patients condition and plan of care including tests being ordered have been discussed with the patient and his wife who indicates understanding and agree with the plan and Code Status   Further plan will depend as patient's clinical course evolves and further radiologic and laboratory data become available.   Time Spent on Admission: 76mins  Cicily Bonano M.D. [facility] 01/22/2014, 4:16 PM Pager: [phone]  If 7PM-7AM, please contact night-coverage [email or web address] Password [email or web address]

## 2014-01-22 NOTE — Consult Note (Signed)
No primary care provider on file. Chief Complaint: Left hip fracture History: Patient s/p fall today - unable to ambulate.  Xrays demonstrate IT hip fracture.  Plan on ORIF today.  History reviewed. No pertinent past medical history.  No Known Allergies  No current facility-administered medications on file prior to encounter.   Current Outpatient Prescriptions on File Prior to Encounter  Medication Sig Dispense Refill  . levofloxacin (LEVAQUIN) 750 MG tablet Take 1 tablet (750 mg total) by mouth daily. (Patient not taking: Reported on 01/22/2014) 9 tablet 0    Physical Exam: Filed Vitals:   01/22/14 1430  BP: 129/70  Pulse: 84  Temp:   Resp: 18   A+O X3 NVI Compartments soft/nt 1+ DP/PT pulses Left hip pain only.   No SOB/CP Abd soft/nt Neurological: He is alert and oriented to person, place, and time.  Skin: Skin is warm and dry.  Psychiatric: He has a normal mood and affect. His behavior is normal. Judgment and thought content normal.   Image: Dg Chest 1 View  01/22/2014   CLINICAL DATA:  Fall, left hip pain  EXAM: CHEST - 1 VIEW  COMPARISON:  CT chest dated 07/17/2008  FINDINGS: Lungs are essentially clear. Biapical pleural parenchymal scarring. No focal consolidation. No pleural effusion or pneumothorax.  The heart is normal in size.  Degenerative changes of the visualized thoracolumbar spine.  IMPRESSION: No evidence of acute cardiopulmonary disease.   Electronically Signed   By: Julian Hy M.D.   On: 01/22/2014 12:34   Dg Hip Complete Left  01/22/2014   CLINICAL DATA:  Fall  EXAM: LEFT HIP - COMPLETE 2+ VIEW  COMPARISON:  None.  FINDINGS: Comminuted left intertrochanteric femur fracture with some deformity is present. Lesser trochanter is somewhat displaced. Vascular calcifications are noted. Osteopenia. Degenerative changes in the lumbar spine and right hip joint are noted.  IMPRESSION: Comminuted left intertrochanteric femur fracture.   Electronically Signed    By: Maryclare Bean M.D.   On: 01/22/2014 12:38    A/P:  Patient s/p fall with left hip pain Xrays demonstrate left intertroch fracture Discussed risks/benefits with patient - consent obtained. Risks: death, stroke, paralysis, infection, hardware failure, malunion/nonunion, blood clots, need for further surgery,

## 2014-01-22 NOTE — Transfer of Care (Signed)
Immediate Anesthesia Transfer of Care Note  Patient: [name] [name]  Procedure(s) Performed: Procedure(s): INTRAMEDULLARY (IM) NAIL INTERTROCHANTRIC (Left)  Patient Location: PACU  Anesthesia Type:General  Level of Consciousness: awake, alert  and oriented  Airway & Oxygen Therapy: Patient Spontanous Breathing and Patient connected to face mask oxygen  Post-op Assessment: Report given to RN and Post -op Vital signs reviewed and stable  Post vital signs: Reviewed and stable  Complications: No apparent anesthesia complications

## 2014-01-22 NOTE — ED Notes (Signed)
Bed: [facility] Expected date:  Expected time:  Means of arrival:  Comments: EMS- fall, hip pain, no deformity

## 2014-01-22 NOTE — ED Notes (Signed)
ALL BELONGINGS GIVEN TO PTAR.

## 2014-01-22 NOTE — Anesthesia Postprocedure Evaluation (Signed)
  Anesthesia Post-op Note  Patient: [name] [name]  Procedure(s) Performed: Procedure(s): INTRAMEDULLARY (IM) NAIL INTERTROCHANTRIC (Left)  Patient Location: PACU  Anesthesia Type:General  Level of Consciousness: awake  Airway and Oxygen Therapy: Patient Spontanous Breathing  Post-op Pain: mild  Post-op Assessment: Post-op Vital signs reviewed  Post-op Vital Signs: Reviewed  Last Vitals:  Filed Vitals:   01/22/14 1800  BP:   Pulse: 91  Temp:   Resp: 16    Complications: No apparent anesthesia complications

## 2014-01-22 NOTE — ED Provider Notes (Signed)
CSN: [id number]     Arrival date & time 01/22/14  1119 History   First MD Initiated Contact with Patient 01/22/14 1147     Chief Complaint  Patient presents with  . Fall  . Hip Pain     (Consider location/radiation/quality/duration/timing/severity/associated sxs/prior Treatment) The history is provided by the patient.  [name] [name] is a 73 y.o. male here with fall. He was walking and missed a step and landed on left hip. Unable to walk afterwards. He did brace himself with right arm and did hit his head but denies LOC or headaches. Not on blood thinners.    History reviewed. No pertinent past medical history. Past Surgical History  Procedure Laterality Date  . Back surgery     No family history on file. History  Substance Use Topics  . Smoking status: Current Every Day Smoker -- 1.50 packs/day    Types: Cigarettes  . Smokeless tobacco: Not on file  . Alcohol Use: No    Review of Systems  Musculoskeletal:       L hip pain   All other systems reviewed and are negative.     Allergies  Review of patient's allergies indicates no known allergies.  Home Medications   Prior to Admission medications   Medication Sig Start Date End Date Taking? Authorizing Provider  tamsulosin (FLOMAX) 0.4 MG CAPS capsule Take 0.4 mg by mouth daily.   Yes Historical Provider, MD  levofloxacin (LEVAQUIN) 750 MG tablet Take 1 tablet (750 mg total) by mouth daily. Patient not taking: Reported on 01/22/2014 11/11/13   Everlene Balls, MD   BP 117/60 mmHg  Pulse 79  Temp(Src) 97.6 F (36.4 C) (Oral)  Resp 18  SpO2 96% Physical Exam  Constitutional: He is oriented to person, place, and time.  Uncomfortable   HENT:  Head: Normocephalic and atraumatic.  Mouth/Throat: Oropharynx is clear and moist.  Eyes: Conjunctivae and EOM are normal. Pupils are equal, round, and reactive to light.  Neck: Normal range of motion. Neck supple.  Cardiovascular: Normal rate and normal heart sounds.    Pulmonary/Chest: Effort normal and breath sounds normal. No respiratory distress. He has no wheezes. He has no rales.  Abdominal: Soft. Bowel sounds are normal. He exhibits no distension. There is no tenderness. There is no rebound and no guarding.  Musculoskeletal:  Unable to move L hip. L leg shortened. 2+ pulses   Neurological: He is alert and oriented to person, place, and time.  Skin: Skin is warm and dry.  Psychiatric: He has a normal mood and affect. His behavior is normal. Judgment and thought content normal.  Nursing note and vitals reviewed.   ED Course  Procedures (including critical care time) Labs Review Labs Reviewed  CBC WITH DIFFERENTIAL  COMPREHENSIVE METABOLIC PANEL  PROTIME-INR  TYPE AND SCREEN    Imaging Review Dg Chest 1 View  01/22/2014   CLINICAL DATA:  Fall, left hip pain  EXAM: CHEST - 1 VIEW  COMPARISON:  CT chest dated 07/17/2008  FINDINGS: Lungs are essentially clear. Biapical pleural parenchymal scarring. No focal consolidation. No pleural effusion or pneumothorax.  The heart is normal in size.  Degenerative changes of the visualized thoracolumbar spine.  IMPRESSION: No evidence of acute cardiopulmonary disease.   Electronically Signed   By: Julian Hy M.D.   On: 01/22/2014 12:34   Dg Hip Complete Left  01/22/2014   CLINICAL DATA:  Fall  EXAM: LEFT HIP - COMPLETE 2+ VIEW  COMPARISON:  None.  FINDINGS: Comminuted left intertrochanteric femur fracture with some deformity is present. Lesser trochanter is somewhat displaced. Vascular calcifications are noted. Osteopenia. Degenerative changes in the lumbar spine and right hip joint are noted.  IMPRESSION: Comminuted left intertrochanteric femur fracture.   Electronically Signed   By: Maryclare Bean M.D.   On: 01/22/2014 12:38     EKG Interpretation None      MDM   Final diagnoses:  Fall    [name] [name] is a 73 y.o. male here with L hip pain. Likely hip fracture vs contusion.   1:29 PM Xray  showed L intertrochanteric fracture. Called Dr. Rolena Infante who plans on doing surgery at [facility]. Preop labs sent.   2:24 PM Admit to hospitalist.     Wandra Arthurs, MD 01/22/14 [phone]

## 2014-01-22 NOTE — Anesthesia Postprocedure Evaluation (Signed)
  Anesthesia Post-op Note  Patient: [name] [name]  Procedure(s) Performed: Procedure(s): INTRAMEDULLARY (IM) NAIL INTERTROCHANTRIC (Left)  Patient Location: PACU  Anesthesia Type:General  Level of Consciousness: awake  Airway and Oxygen Therapy: Patient Spontanous Breathing  Post-op Pain: mild  Post-op Assessment: Post-op Vital signs reviewed  Post-op Vital Signs: Reviewed  Last Vitals:  Filed Vitals:   01/22/14 1905  BP:   Pulse: 88  Temp: 36.3 C  Resp: 17    Complications: No apparent anesthesia complications

## 2014-01-22 NOTE — Op Note (Signed)
NAMECHAZ, MCGLASSON                 ACCOUNT NO.:  [id number]  MEDICAL RECORD NO.:  [id number]  LOCATION:  5N10C                        FACILITY:  [facility]  PHYSICIAN:  Dahlia Bailiff, MD    DATE OF BIRTH:  [birth year]  DATE OF PROCEDURE:  01/22/2014 DATE OF DISCHARGE:                              OPERATIVE REPORT   PREOPERATIVE DIAGNOSIS:  Left intertrochanteric hip fracture.  POSTOPERATIVE DIAGNOSIS:  Left intertrochanteric hip fracture.  OPERATIVE PROCEDURES:  Open reduction and internal fixation with IM nail for left hip fracture.  Instrumentation used was a Biomet trochanteric nail with a 115 mm lag screw and 42 mm distal locking screw.  COMPLICATIONS:  None.  HISTORY:  This is a very pleasant gentleman who fell earlier today and was unable to ambulate.  In the ER, he was found to have an intertrochanteric hip fracture.  Orthopedic consultation was requested. After getting preoperative medical clearance, the patient was brought to the operating room for an ORIF of the hip.  All appropriate risks, benefits, and alternatives were discussed with the patient and consent was obtained.  OPERATIVE NOTE:  The patient was brought to the operating room, placed supine on the operating table.  After successful induction of general anesthesia, the right lower extremity was placed in the well-leg holder in a flexed and abducted position and the left leg was placed into traction.  All bony prominences were well padded, and the left leg was prepped and draped in a standard fashion.  Time-out was taken to confirm the patient, procedure, and all other pertinent important data.  Once that was completed, a gentle reduction maneuver was performed and surgery was initiated.  An incision was made proximal to the greater trochanter.  Guide pin was advanced down to the tip of the greater trochanter, confirmed trajectory as I advanced the guide pin through the tip of the trochanter into the proximal  femur was confirmed in both AP and lateral fluoroscopy.  Once I was properly positioned, the 1 step reamer was then advanced to the level of the lesser trochanter using fluoroscopic guidance.  Once this was completed, the standard trochanter nail was then obtained and gently advanced into its appropriate position.  I confirmed satisfactory positioning of the nail with the AP and lateral x-rays.  A second incision was made on the lateral side of the femur and the lag screw guide pin was advanced to the lateral aspect of the femur. The guide pin was then advanced through the femur through the nail and into the femoral neck and head region.  I confirmed satisfactory trajectory in both planes.  It was in the center of the neck and center of the head.  I then measured and then drilled a depth of 115 cm.  I then placed the screw and got excellent purchase.  There was no displacement of the fracture site during the insertion of the lag screw.  I then locked the lag screw and then torqued off a quarter turn to allow for some compression.  Once this was done, I then connected the distal locking guide pin to the device and then drilled and then placed a distal locking screw.  I removed the inserting device, irrigated all the wounds, closed the deep fascia of each with interrupted #1 Vicryl sutures for the deep, 2-0 Vicryl sutures for the superficial, and staples for the skin.  Steri-Strips and dry dressing were applied.  Final x-rays were satisfactory.  The hardware was in good position.  Fracture was reduced.  There were no intraoperative complications.  Minimal blood loss.     Dahlia Bailiff, MD     DDB/MEDQ  D:  01/22/2014  T:  01/22/2014  Job:  [id number]

## 2014-01-22 NOTE — ED Notes (Signed)
Pt' wife number is [phone] her name is Mikey Maffett.

## 2014-01-22 NOTE — ED Notes (Signed)
Bed: HQ60 Expected date:  Expected time:  Means of arrival:  Comments: Hold for Triage 9

## 2014-01-23 ENCOUNTER — Inpatient Hospital Stay (HOSPITAL_COMMUNITY): Payer: 59

## 2014-01-23 LAB — CBC
HEMATOCRIT: 34.1 % — AB (ref 39.0–52.0)
HEMOGLOBIN: 11.8 g/dL — AB (ref 13.0–17.0)
MCH: 31.7 pg (ref 26.0–34.0)
MCHC: 34.6 g/dL (ref 30.0–36.0)
MCV: 91.7 fL (ref 78.0–100.0)
Platelets: UNDETERMINED 10*3/uL (ref 150–400)
RBC: 3.72 MIL/uL — ABNORMAL LOW (ref 4.22–5.81)
RDW: 14.2 % (ref 11.5–15.5)
WBC: 4.9 10*3/uL (ref 4.0–10.5)

## 2014-01-23 LAB — BASIC METABOLIC PANEL
Anion gap: 6 (ref 5–15)
BUN: 11 mg/dL (ref 6–23)
CHLORIDE: 101 meq/L (ref 96–112)
CO2: 26 mmol/L (ref 19–32)
Calcium: 8.4 mg/dL (ref 8.4–10.5)
Creatinine, Ser: 0.85 mg/dL (ref 0.50–1.35)
GFR calc Af Amer: >90 mL/min (ref >90)
GFR calc non Af Amer: 84 mL/min — ABNORMAL LOW (ref >90)
GLUCOSE: 127 mg/dL — AB (ref 70–99)
POTASSIUM: 3.8 mmol/L (ref 3.5–5.1)
Sodium: 133 mmol/L — ABNORMAL LOW (ref 135–145)

## 2014-01-23 MED ORDER — ENOXAPARIN SODIUM 40 MG/0.4ML ~~LOC~~ SOLN
40.0000 mg | SUBCUTANEOUS | Status: DC
Start: 2014-01-24 — End: 2014-01-26
  Administered 2014-01-24 – 2014-01-26 (×3): 40 mg via SUBCUTANEOUS
  Filled 2014-01-23 (×4): qty 0.4

## 2014-01-23 MED ORDER — TAMSULOSIN HCL 0.4 MG PO CAPS
0.8000 mg | ORAL_CAPSULE | Freq: Every day | ORAL | Status: DC
  Administered 2014-01-24 – 2014-01-26 (×3): 0.8 mg via ORAL
  Filled 2014-01-23 (×4): qty 2

## 2014-01-23 MED ORDER — OXYCODONE HCL 5 MG PO TABS: 5.0000 mg | ORAL_TABLET | ORAL | Status: DC | PRN

## 2014-01-23 MED ORDER — ENOXAPARIN SODIUM 40 MG/0.4ML ~~LOC~~ SOLN: 40.0000 mg | SUBCUTANEOUS | Status: DC

## 2014-01-23 NOTE — Discharge Instructions (Signed)
DVT prophylaxis: Lovenox post op one week then ASA 325mg  BID

## 2014-01-23 NOTE — Progress Notes (Signed)
[facility] PROGRESS NOTE   [name] [name] QVZ:[id number] DOB: 11-12-1940 DOA: 01/22/2014 PCP: No primary care provider on file.  HPI/Subjective: Feels okay, complains about suprapubic pressure  Assessment/Plan: Principal Problem:   Hip fracture, left Active Problems:   BPH (benign prostatic hypertrophy)   Hip fracture    Left hip fracture Presented with a fall and sustained a left intertrochanteric hip fracture. Orthopedics consulted, ORIF with left intertrochanteric nailing done by Dr. Rolena Infante on 12/24. Up with PT/OT. Pain is controlled with narcotics. DVT prophylaxis with subcutaneous Lovenox.  Acute urinary retention She developed urinary retention since yesterday, last bladder scan was 620 mL. Patient will try to stand up to urinate, nurses will try Coude catheter. Has history of BPH, will increase Flomax dose.  Code Status: Full  Family Communication: Plan discussed with the patient. Disposition Plan: Remains inpatient   Consultants:  Orthopedics  Procedures:  ORIF with left hip intertrochanteric nailing done by Dr. Rolena Infante  Antibiotics:  None   Objective: Filed Vitals:   01/23/14 0509  BP: 137/70  Pulse: 95  Temp: 98.8 F (37.1 C)  Resp: 16    Intake/Output Summary (Last 24 hours) at 01/23/14 1134 Last data filed at 01/23/14 0241  Gross per 24 hour  Intake   1000 ml  Output    150 ml  Net    850 ml   There were no vitals filed for this visit.  Exam: General: Alert and awake, oriented x3, not in any acute distress. HEENT: anicteric sclera, pupils reactive to light and accommodation, EOMI CVS: S1-S2 clear, no murmur rubs or gallops Chest: clear to auscultation bilaterally, no wheezing, rales or rhonchi Abdomen: soft nontender, nondistended, normal bowel sounds, no organomegaly Extremities: no cyanosis, clubbing or edema noted bilaterally Neuro: Cranial nerves II-XII intact, no focal neurological deficits  Data Reviewed: Basic  Metabolic Panel:  Recent Labs Lab 01/22/14 1321 01/22/14 1429 01/22/14 2005 01/23/14 0537  NA 136 139  --  133*  K 3.8 3.8  --  3.8  CL 101 101  --  101  CO2 28  --   --  26  GLUCOSE 115* 112*  --  127*  BUN 14 13  --  11  CREATININE 0.89 0.90 0.94 0.85  CALCIUM 8.8  --   --  8.4   Liver Function Tests:  Recent Labs Lab 01/22/14 1321  AST 15  ALT 13  ALKPHOS 49  BILITOT 0.4  PROT 7.5  ALBUMIN 3.8   No results for input(s): LIPASE, AMYLASE in the last 168 hours. No results for input(s): AMMONIA in the last 168 hours. CBC:  Recent Labs Lab 01/22/14 1321 01/22/14 1429 01/22/14 2005 01/23/14 0537  WBC 9.6  --  5.5 4.9  NEUTROABS 8.0*  --   --   --   HGB 13.5 14.3 12.0* 11.8*  HCT 39.5 42.0 35.4* 34.1*  MCV 92.7  --  92.4 91.7  PLT PLATELET CLUMPS NOTED ON SMEAR, UNABLE TO ESTIMATE  --  PLATELET CLUMPS NOTED ON SMEAR, UNABLE TO ESTIMATE PLATELET CLUMPS NOTED ON SMEAR, UNABLE TO ESTIMATE   Cardiac Enzymes: No results for input(s): CKTOTAL, CKMB, CKMBINDEX, TROPONINI in the last 168 hours. BNP (last 3 results) No results for input(s): PROBNP in the last 8760 hours. CBG: No results for input(s): GLUCAP in the last 168 hours.  Micro No results found for this or any previous visit (from the past 240 hour(s)).   Studies: Dg Chest 1 View  01/22/2014   CLINICAL  DATA:  Fall, left hip pain  EXAM: CHEST - 1 VIEW  COMPARISON:  CT chest dated 07/17/2008  FINDINGS: Lungs are essentially clear. Biapical pleural parenchymal scarring. No focal consolidation. No pleural effusion or pneumothorax.  The heart is normal in size.  Degenerative changes of the visualized thoracolumbar spine.  IMPRESSION: No evidence of acute cardiopulmonary disease.   Electronically Signed   By: Julian Hy M.D.   On: 01/22/2014 12:34   Dg Hip Complete Left  01/22/2014   CLINICAL DATA:  Fall  EXAM: LEFT HIP - COMPLETE 2+ VIEW  COMPARISON:  None.  FINDINGS: Comminuted left intertrochanteric  femur fracture with some deformity is present. Lesser trochanter is somewhat displaced. Vascular calcifications are noted. Osteopenia. Degenerative changes in the lumbar spine and right hip joint are noted.  IMPRESSION: Comminuted left intertrochanteric femur fracture.   Electronically Signed   By: Maryclare Bean M.D.   On: 01/22/2014 12:38   Dg Hip Operative Left  01/22/2014   CLINICAL DATA:  Acute left hip intertrochanteric fracture, operative fixation  EXAM: OPERATIVE LEFT HIP  COMPARISON:  01/22/2014  FINDINGS: Proximal left femur intra medullary rod and screw fixation reduce the intertrochanteric fracture with improved alignment. Fracture lines remain visible. Small displaced lesser trochanter fragment again noted. No complicating feature.  IMPRESSION: Status post ORIF for the acute left hip intertrochanteric fracture with improved alignment.   Electronically Signed   By: Daryll Brod M.D.   On: 01/22/2014 17:46   Pelvis Portable  01/23/2014   CLINICAL DATA:  Operative reduction and internal fixation of femur fracture.  EXAM: PORTABLE PELVIS 1-2 VIEWS  COMPARISON:  Multiple exams, including 01/22/2014  FINDINGS: Fixation of the intertrochanteric fracture with a trochanteric nail and lag screw apparatus, successfully bridging the fracture and with expected orientation and appearance. No new fracture. Avulsed lesser trochanter noted.  Lower lumbar spondylosis. Mild to moderate degenerative arthropathy of both hips.  IMPRESSION: 1. Intramedullary nail placement for left hip trochanteric fracture, without complicating feature observed.   Electronically Signed   By: Sherryl Barters M.D.   On: 01/23/2014 08:34    Scheduled Meds: . sodium chloride   Intravenous STAT  . docusate sodium  100 mg Oral BID  . enoxaparin (LOVENOX) injection  30 mg Subcutaneous Q24H  . ferrous sulfate  325 mg Oral TID PC  . tamsulosin  0.4 mg Oral Daily   Continuous Infusions: . lactated ringers 10 mL/hr at 01/22/14 1558  .  lactated ringers 85 mL/hr at 01/22/14 2150       Time spent: 35 minutes    [facility] A  [facility] Pager [phone] If 7PM-7AM, please contact night-coverage at [email or web address], password [facility] 01/23/2014, 11:34 AM  LOS: 1 day

## 2014-01-23 NOTE — Progress Notes (Signed)
Patient with bladder scan 620 ml. States cannot stand with assist to urinate due to pain -9/10. Given pain medication -will assist to stand to urinate after.

## 2014-01-23 NOTE — Progress Notes (Signed)
Pt DTV but unable to do so on his own. Pt attempting to go but only able to put out 50 cc at a time. Bladder scan revealed 500 cc of urine[facility] paged. Received order to in & out cath x1 from Kathline Magic NP. Leia Alf RN attempted In & out cath but met resistance & was unable to carry out orders. NT Miranda attempted a separate time & was also unsuccessful. [facility] notified again & received phone call back from Kathline Magic NP. RN & NP discussed challenges met & since this situation is not an "emergent" case a urology consult would be placed for first thing in the morning. Pt is resting comfortably & in no signs of distress. Nursing will continue to monitor.

## 2014-01-23 NOTE — Progress Notes (Signed)
Orthopedic Tech Progress Note Patient Details:  [name] [name] [birth year] [id number] OHF applied to bed Patient ID: [name], male   DOB: 04-14-1940, 73 y.o.   MRN: [id number]   Fenton Foy 01/23/2014, 12:36 PM

## 2014-01-23 NOTE — Progress Notes (Signed)
Subjective: 1 Day Post-Op Procedure(s) (LRB): INTRAMEDULLARY (IM) NAIL INTERTROCHANTRIC (Left) Patient reports pain as moderate.  Doesn't feel like he can get up today.  +n/v  Objective: Vital signs in last 24 hours: Temp:  [97.4 F (36.3 C)-99.2 F (37.3 C)] 98.8 F (37.1 C) (12/25 0509) Pulse Rate:  [79-102] 95 (12/25 0509) Resp:  [11-21] 16 (12/25 0509) BP: (117-153)/(60-89) 137/70 mmHg (12/25 0509) SpO2:  [92 %-100 %] 93 % (12/25 0509)  Intake/Output from previous day: 12/24 0701 - 12/25 0700 In: 1000 [I.V.:1000] Out: 150 [Urine:50; Blood:100] Intake/Output this shift:     Recent Labs  01/22/14 1321 01/22/14 1429 01/22/14 2005 01/23/14 0537  HGB 13.5 14.3 12.0* 11.8*    Recent Labs  01/22/14 2005 01/23/14 0537  WBC 5.5 PENDING  RBC 3.83* 3.72*  HCT 35.4* 34.1*  PLT PLATELET CLUMPS NOTED ON SMEAR, UNABLE TO ESTIMATE PENDING    Recent Labs  01/22/14 1321 01/22/14 1429 01/22/14 2005 01/23/14 0537  NA 136 139  --  133*  K 3.8 3.8  --  3.8  CL 101 101  --  101  CO2 28  --   --  26  BUN 14 13  --  11  CREATININE 0.89 0.90 0.94 0.85  GLUCOSE 115* 112*  --  127*  CALCIUM 8.8  --   --  8.4    Recent Labs  01/22/14 1321  INR 1.03    PE:  wn wd male in nad.  L hip dressed and dry.  Skin healthy and intact.  NVI at L LE.  Assessment/Plan: 1 Day Post-Op Procedure(s) (LRB): INTRAMEDULLARY (IM) NAIL INTERTROCHANTRIC (Left) Up with therapy   Ziyanna Tolin 01/23/2014, 8:23 AM

## 2014-01-24 DIAGNOSIS — R338 Other retention of urine: Secondary | ICD-10-CM | POA: Diagnosis present

## 2014-01-24 LAB — BASIC METABOLIC PANEL
Anion gap: 5 (ref 5–15)
BUN: 12 mg/dL (ref 6–23)
CHLORIDE: 101 meq/L (ref 96–112)
CO2: 28 mmol/L (ref 19–32)
Calcium: 8.1 mg/dL — ABNORMAL LOW (ref 8.4–10.5)
Creatinine, Ser: 1.02 mg/dL (ref 0.50–1.35)
GFR calc non Af Amer: 70 mL/min — ABNORMAL LOW (ref >90)
GFR, EST AFRICAN AMERICAN: 82 mL/min — AB (ref >90)
GLUCOSE: 124 mg/dL — AB (ref 70–99)
POTASSIUM: 3.8 mmol/L (ref 3.5–5.1)
Sodium: 134 mmol/L — ABNORMAL LOW (ref 135–145)

## 2014-01-24 LAB — CBC
HEMATOCRIT: 30.5 % — AB (ref 39.0–52.0)
HEMOGLOBIN: 10.2 g/dL — AB (ref 13.0–17.0)
MCH: 30.8 pg (ref 26.0–34.0)
MCHC: 33.4 g/dL (ref 30.0–36.0)
MCV: 92.1 fL (ref 78.0–100.0)
Platelets: DECREASED 10*3/uL (ref 150–400)
RBC: 3.31 MIL/uL — ABNORMAL LOW (ref 4.22–5.81)
RDW: 14.3 % (ref 11.5–15.5)
WBC: 5.6 10*3/uL (ref 4.0–10.5)

## 2014-01-24 NOTE — Progress Notes (Signed)
Subjective: 2 Days Post-Op Procedure(s) (LRB): INTRAMEDULLARY (IM) NAIL INTERTROCHANTRIC (Left) Patient reports pain as 3 on 0-10 scale.    Objective: Vital signs in last 24 hours: Temp:  [98.3 F (36.8 C)-99 F (37.2 C)] 98.3 F (36.8 C) (12/26 0610) Pulse Rate:  [90-95] 92 (12/26 0610) Resp:  [18] 18 (12/26 0610) BP: (111-124)/(58-71) 111/64 mmHg (12/26 0610) SpO2:  [93 %-94 %] 93 % (12/26 0610)  Intake/Output from previous day: 12/25 0701 - 12/26 0700 In: 1440 [P.O.:1440] Out: 2170 [Urine:2170] Intake/Output this shift:     Recent Labs  01/22/14 1321 01/22/14 1429 01/22/14 2005 01/23/14 0537 01/24/14 0450  HGB 13.5 14.3 12.0* 11.8* 10.2*    Recent Labs  01/23/14 0537 01/24/14 0450  WBC 4.9 5.6  RBC 3.72* 3.31*  HCT 34.1* 30.5*  PLT PLATELET CLUMPS NOTED ON SMEAR, UNABLE TO ESTIMATE PLATELET CLUMPS NOTED ON SMEAR, COUNT APPEARS DECREASED    Recent Labs  01/23/14 0537 01/24/14 0450  NA 133* 134*  K 3.8 3.8  CL 101 101  CO2 26 28  BUN 11 12  CREATININE 0.85 1.02  GLUCOSE 127* 124*  CALCIUM 8.4 8.1*    Recent Labs  01/22/14 1321  INR 1.03    Neurologically intact Intact pulses distally Dorsiflexion/Plantar flexion intact Incision: dressing C/D/I Compartment soft  Assessment/Plan: 2 Days Post-Op Procedure(s) (LRB): INTRAMEDULLARY (IM) NAIL INTERTROCHANTRIC (Left) Advance diet Up with therapy  dsg change  Ronith Berti C 01/24/2014, 7:57 AM

## 2014-01-24 NOTE — Plan of Care (Signed)
Problem: Acute Rehab PT Goals(only PT should resolve) Goal: Pt Will Transfer Bed To Chair/Chair To Bed With [facility]     

## 2014-01-24 NOTE — Evaluation (Signed)
Physical Therapy Evaluation Patient Details Name: [name] [name] MRN: [id number] DOB: 1940-06-13 Today's Date: 01/24/2014   History of Present Illness  73 yo male with fall and resulting L hip femoral fracture, IM nailing with PMHx:  BPH and back surgery  Clinical Impression  Pt was seen for initial evaluation and to get up to chair after being in bed mostly for 2 days.  He is capable of assisting standing but was so fearful of pain that CNA stepped in to assist PT.  He is very limited for comfort level with exerting and will therefore recommend SNF to avoid inability to care for himself at home as son is in sporadically.    Follow Up Recommendations SNF;Supervision/Assistance - 24 hour    Equipment Recommendations  Rolling walker with 5" wheels    Recommendations for Other Services       Precautions / Restrictions Precautions Precautions: Fall Precaution Comments: Pt received a handout for hip exercises Restrictions Weight Bearing Restrictions: Yes LLE Weight Bearing: Weight bearing as tolerated      Mobility  Bed Mobility Overal bed mobility: Needs Assistance Bed Mobility: Rolling;Supine to Sit Rolling: Mod assist   Supine to sit: Mod assist;HOB elevated (Pt grabbed PT's hand and squeezed excessively)     General bed mobility comments: Pt was very dramatic about his pain and took a great deal of time to transition to EOB, asked CNA to assist as the pt is actually pulling and potentially unsafe with PT by pulling dramatically and squeezing arm/hands  Transfers Overall transfer level: Needs assistance Equipment used: 2 person hand held assist;Rolling walker (2 wheeled) (bed elevated) Transfers: Sit to/from [facility] Sit to Stand: Mod assist;+2 physical assistance Stand pivot transfers: Mod assist;+2 physical assistance       General transfer comment: Pt minimizes WB on LLE and needs dense cues to use walker correctly as he cannot sequence well  without cues  Ambulation/Gait             General Gait Details: transfer steps  Stairs            Wheelchair Mobility    Modified Rankin (Stroke Patients Only)       Balance Overall balance assessment: Needs assistance Sitting-balance support: Bilateral upper extremity supported;Feet supported Sitting balance-Leahy Scale: Fair Sitting balance - Comments: Pt was leanign to R off L hip and took a great deal of cuing to even out   Standing balance support: Bilateral upper extremity supported Standing balance-Leahy Scale: Poor Standing balance comment: Pt follows in structions with slow follow through                             Pertinent Vitals/Pain Pain Assessment: 0-10 Pain Score: 10-Worst pain ever Pain Location: L hip with any movement Pain Intervention(s): Limited activity within patient's tolerance;Monitored during session;Premedicated before session;Repositioned;Ice applied    Home Living Family/patient expects to be discharged to:: Skilled nursing facility                      Prior Function Level of Independence: Independent               Hand Dominance        Extremity/Trunk Assessment   Upper Extremity Assessment: Overall WFL for tasks assessed           Lower Extremity Assessment: Overall WFL for tasks assessed (excluding L hip new surgery)  Cervical / Trunk Assessment: Normal  Communication   Communication: No difficulties  Cognition Arousal/Alertness: Awake/alert Behavior During Therapy: WFL for tasks assessed/performed Overall Cognitive Status: Within Functional Limits for tasks assessed                      General Comments General comments (skin integrity, edema, etc.): Pt very easily agitiated about moving from bed, but was actually fairly capable.  Meds were given and had enough time to become comfortable before PT arrived.    Exercises        Assessment/Plan    PT Assessment  Patient needs continued PT services  PT Diagnosis Difficulty walking   PT Problem List Decreased strength;Decreased range of motion;Decreased activity tolerance;Decreased balance;Decreased mobility;Decreased coordination;Decreased knowledge of use of DME;Decreased safety awareness;Decreased knowledge of precautions;Decreased skin integrity;Pain  PT Treatment Interventions DME instruction;Gait training;Stair training;Functional mobility training;Therapeutic activities;Therapeutic exercise;Balance training;Neuromuscular re-education;Cognitive remediation;Patient/family education   PT Goals (Current goals can be found in the Care Plan section) Acute Rehab PT Goals Patient Stated Goal: to be home PT Goal Formulation: With patient Time For Goal Achievement: 02/07/14 Potential to Achieve Goals: Good    Frequency Min 5X/week   Barriers to discharge Inaccessible home environment;Decreased caregiver support      Co-evaluation               End of Session Equipment Utilized During Treatment: Other (comment) (FWW) Activity Tolerance: Patient limited by pain Patient left: in chair;with call bell/phone within reach;with nursing/sitter in room Nurse Communication: Mobility status         Time: 7169-6789 PT Time Calculation (min) (ACUTE ONLY): 31 min   Charges:   PT Evaluation $Initial PT Evaluation Tier I: 1 Procedure PT Treatments $Therapeutic Activity: 8-22 mins   PT G Codes:        Ramond Dial Feb 08, 2014, 1:17 PM   Mee Hives, PT MS Acute Rehab Dept. Number: [phone]

## 2014-01-24 NOTE — Progress Notes (Signed)
OT Cancellation Note  Patient Details Name: HILLARD GOODWINE MRN: [id number] DOB: [birth year]   Cancelled Treatment:    Reason Eval/Treat Not Completed: Pt eating lunch  - will reattempt.   Darlina Rumpf [place], OTR/L [phone]  01/24/2014, 12:07 PM

## 2014-01-24 NOTE — Evaluation (Signed)
Occupational Therapy Evaluation Patient Details Name: [name] [name] MRN: [id number] DOB: Sep 06, 1940 Today's Date: 01/24/2014    History of Present Illness 73 yo male with fall and resulting L hip femoral fracture, IM nailing with PMHx:  BPH and back surgery   Clinical Impression   Pt admitted with above. He demonstrates the below listed deficits and will benefit from continued OT to maximize safety and independence with BADLs.   Pt is limited by pain.  Requires max encouragement to at least try to participate, and only participated very minimally.  Pt requires max - total A for BADLs.  Anticipate he is going to require SNF level rehab at discharge.       Follow Up Recommendations  SNF    Equipment Recommendations  3 in 1 bedside comode    Recommendations for Other Services       Precautions / Restrictions Precautions Precautions: Fall Precaution Comments: Pt received a handout for hip exercises Restrictions Weight Bearing Restrictions: Yes LLE Weight Bearing: Weight bearing as tolerated      Mobility Bed Mobility Overal bed mobility: Needs Assistance Bed Mobility: Rolling;Supine to Sit Rolling: Mod assist   Supine to sit: Mod assist;HOB elevated (Pt grabbed PT's hand and squeezed excessively)     General bed mobility comments: Pt was very dramatic about his pain and took a great deal of time to transition to EOB, asked CNA to assist as the pt is actually pulling and potentially unsafe with PT by pulling dramatically and squeezing arm/hands  Transfers Overall transfer level: Needs assistance Equipment used: 2 person hand held assist;Rolling walker (2 wheeled) (bed elevated) Transfers: Sit to/from [facility] Sit to Stand: Mod assist;+2 physical assistance Stand pivot transfers: Mod assist;+2 physical assistance       General transfer comment: Pt minimizes WB on LLE and needs dense cues to use walker correctly as he cannot sequence well without  cues    Balance Overall balance assessment: Needs assistance Sitting-balance support: Feet supported Sitting balance-Leahy Scale: Fair Sitting balance - Comments: Pt falls backwards frequently citing pain    Standing balance support: Bilateral upper extremity supported Standing balance-Leahy Scale: Poor Standing balance comment: Pt follows in structions with slow follow through                            ADL Overall ADL's : Needs assistance/impaired Eating/Feeding: Independent   Grooming: Wash/dry hands;Wash/dry face;Oral care;Brushing hair;Set up;Sitting   Upper Body Bathing: Supervision/ safety;Set up;Sitting   Lower Body Bathing: Maximal assistance;Sit to/from stand   Upper Body Dressing : Minimal assistance;Sitting   Lower Body Dressing: Total assistance;Sit to/from stand   Toilet Transfer: Total assistance Toilet Transfer Details (indicate cue type and reason): unable to perform with + 1 assist Toileting- Clothing Manipulation and Hygiene: Total assistance;Sit to/from stand       Functional mobility during ADLs: Moderate assistance;+2 for physical assistance;Rolling walker General ADL Comments: Pt requires max encouragement to at least try to participate.  Rationale for increased activity provided to pt and risks of immobility explained to him.  He was only able to tolerate sitting EOB, and performing reaching activities this date due to hip pain.  Adamantly refused to attempt to stand.  required significant time and encouragement to even move to EOB sitting      Vision                     Perception  Praxis      Pertinent Vitals/Pain Pain Assessment: 0-10 Pain Score: 5  Pain Location: Lt hip Pain Descriptors / Indicators: Aching;Constant;Burning Pain Intervention(s): Limited activity within patient's tolerance;Patient requesting pain meds-RN notified;Repositioned     Hand Dominance     Extremity/Trunk Assessment Upper Extremity  Assessment Upper Extremity Assessment: Overall WFL for tasks assessed   Lower Extremity Assessment Lower Extremity Assessment: Defer to PT evaluation   Cervical / Trunk Assessment Cervical / Trunk Assessment: Normal   Communication Communication Communication: No difficulties   Cognition Arousal/Alertness: Awake/alert Behavior During Therapy: WFL for tasks assessed/performed Overall Cognitive Status: Within Functional Limits for tasks assessed                     General Comments       Exercises       Shoulder Instructions      Home Living Family/patient expects to be discharged to:: Skilled nursing facility                                        Prior Functioning/Environment Level of Independence: Independent             OT Diagnosis: Generalized weakness;Acute pain   OT Problem List: Decreased strength;Decreased activity tolerance;Impaired balance (sitting and/or standing);Decreased safety awareness;Decreased knowledge of use of DME or AE;Pain   OT Treatment/Interventions: Self-care/ADL training;DME and/or AE instruction;Therapeutic activities;Patient/family education;Balance training    OT Goals(Current goals can be found in the care plan section) Acute Rehab OT Goals Patient Stated Goal: to reduce pain  OT Goal Formulation: With patient Time For Goal Achievement: 02/07/14 Potential to Achieve Goals: Fair ADL Goals Pt Will Perform Grooming: with min assist;standing Pt Will Perform Lower Body Bathing: with min assist;with adaptive equipment;sit to/from stand Pt Will Perform Lower Body Dressing: with min assist;with adaptive equipment;sit to/from stand Pt Will Transfer to Toilet: with min assist;ambulating;regular height toilet;bedside commode;grab bars Pt Will Perform Toileting - Clothing Manipulation and hygiene: with min assist;sit to/from stand  OT Frequency: Min 2X/week   Barriers to D/C: Decreased caregiver support           Co-evaluation              End of Session Nurse Communication: Mobility status;Patient requests pain meds  Activity Tolerance: Patient limited by pain Patient left: in chair;with call bell/phone within reach   Time: 1418-1444 OT Time Calculation (min): 26 min Charges:  OT General Charges $OT Visit: 1 Procedure OT Evaluation $Initial OT Evaluation Tier I: 1 Procedure OT Treatments $Therapeutic Activity: 8-22 mins G-Codes:    Griselle Rufer M 02-05-14, 3:04 PM

## 2014-01-24 NOTE — Progress Notes (Signed)
[facility] PROGRESS NOTE   [name] [name]:[id number] DOB: 25-Jul-1940 DOA: 01/22/2014 PCP: No primary care provider on file.  HPI/Subjective: Feels much better than yesterday, no complaints.  Assessment/Plan: Principal Problem:   Hip fracture, left Active Problems:   BPH (benign prostatic hypertrophy)   Hip fracture    Left hip fracture Presented with a fall and sustained a left intertrochanteric hip fracture. Orthopedics consulted, ORIF with left intertrochanteric nailing done by Dr. Rolena Infante on 12/24. Up with PT/OT. Pain is controlled with narcotics. DVT prophylaxis with subcutaneous Lovenox.  Acute urinary retention She developed urinary retention since yesterday, last bladder scan was 620 mL. Has history of BPH, will increase Flomax dose. Coude catheter placed by nursing staff. Try to get the catheter out tomorrow for voiding trial prior to discharge.  Code Status: Full  Family Communication: Plan discussed with the patient. Disposition Plan: Remains inpatient   Consultants:  Orthopedics  Procedures:  ORIF with left hip intertrochanteric nailing done by Dr. Rolena Infante  Antibiotics:  None   Objective: Filed Vitals:   01/24/14 0610  BP: 111/64  Pulse: 92  Temp: 98.3 F (36.8 C)  Resp: 18    Intake/Output Summary (Last 24 hours) at 01/24/14 0942 Last data filed at 01/24/14 3762  Gross per 24 hour  Intake   1680 ml  Output   2170 ml  Net   -490 ml   There were no vitals filed for this visit.  Exam: General: Alert and awake, oriented x3, not in any acute distress. HEENT: anicteric sclera, pupils reactive to light and accommodation, EOMI CVS: S1-S2 clear, no murmur rubs or gallops Chest: clear to auscultation bilaterally, no wheezing, rales or rhonchi Abdomen: soft nontender, nondistended, normal bowel sounds, no organomegaly Extremities: no cyanosis, clubbing or edema noted bilaterally Neuro: Cranial nerves II-XII intact, no focal  neurological deficits  Data Reviewed: Basic Metabolic Panel:  Recent Labs Lab 01/22/14 1321 01/22/14 1429 01/22/14 2005 01/23/14 0537 01/24/14 0450  NA 136 139  --  133* 134*  K 3.8 3.8  --  3.8 3.8  CL 101 101  --  101 101  CO2 28  --   --  26 28  GLUCOSE 115* 112*  --  127* 124*  BUN 14 13  --  11 12  CREATININE 0.89 0.90 0.94 0.85 1.02  CALCIUM 8.8  --   --  8.4 8.1*   Liver Function Tests:  Recent Labs Lab 01/22/14 1321  AST 15  ALT 13  ALKPHOS 49  BILITOT 0.4  PROT 7.5  ALBUMIN 3.8   No results for input(s): LIPASE, AMYLASE in the last 168 hours. No results for input(s): AMMONIA in the last 168 hours. CBC:  Recent Labs Lab 01/22/14 1321 01/22/14 1429 01/22/14 2005 01/23/14 0537 01/24/14 0450  WBC 9.6  --  5.5 4.9 5.6  NEUTROABS 8.0*  --   --   --   --   HGB 13.5 14.3 12.0* 11.8* 10.2*  HCT 39.5 42.0 35.4* 34.1* 30.5*  MCV 92.7  --  92.4 91.7 92.1  PLT PLATELET CLUMPS NOTED ON SMEAR, UNABLE TO ESTIMATE  --  PLATELET CLUMPS NOTED ON SMEAR, UNABLE TO ESTIMATE PLATELET CLUMPS NOTED ON SMEAR, UNABLE TO ESTIMATE PLATELET CLUMPS NOTED ON SMEAR, COUNT APPEARS DECREASED   Cardiac Enzymes: No results for input(s): CKTOTAL, CKMB, CKMBINDEX, TROPONINI in the last 168 hours. BNP (last 3 results) No results for input(s): PROBNP in the last 8760 hours. CBG: No results for input(s): GLUCAP in  the last 168 hours.  Micro No results found for this or any previous visit (from the past 240 hour(s)).   Studies: Dg Chest 1 View  01/22/2014   CLINICAL DATA:  Fall, left hip pain  EXAM: CHEST - 1 VIEW  COMPARISON:  CT chest dated 07/17/2008  FINDINGS: Lungs are essentially clear. Biapical pleural parenchymal scarring. No focal consolidation. No pleural effusion or pneumothorax.  The heart is normal in size.  Degenerative changes of the visualized thoracolumbar spine.  IMPRESSION: No evidence of acute cardiopulmonary disease.   Electronically Signed   By: Julian Hy  M.D.   On: 01/22/2014 12:34   Dg Hip Complete Left  01/22/2014   CLINICAL DATA:  Fall  EXAM: LEFT HIP - COMPLETE 2+ VIEW  COMPARISON:  None.  FINDINGS: Comminuted left intertrochanteric femur fracture with some deformity is present. Lesser trochanter is somewhat displaced. Vascular calcifications are noted. Osteopenia. Degenerative changes in the lumbar spine and right hip joint are noted.  IMPRESSION: Comminuted left intertrochanteric femur fracture.   Electronically Signed   By: Maryclare Bean M.D.   On: 01/22/2014 12:38   Dg Hip Operative Left  01/22/2014   CLINICAL DATA:  Acute left hip intertrochanteric fracture, operative fixation  EXAM: OPERATIVE LEFT HIP  COMPARISON:  01/22/2014  FINDINGS: Proximal left femur intra medullary rod and screw fixation reduce the intertrochanteric fracture with improved alignment. Fracture lines remain visible. Small displaced lesser trochanter fragment again noted. No complicating feature.  IMPRESSION: Status post ORIF for the acute left hip intertrochanteric fracture with improved alignment.   Electronically Signed   By: Daryll Brod M.D.   On: 01/22/2014 17:46   Pelvis Portable  01/23/2014   CLINICAL DATA:  Operative reduction and internal fixation of femur fracture.  EXAM: PORTABLE PELVIS 1-2 VIEWS  COMPARISON:  Multiple exams, including 01/22/2014  FINDINGS: Fixation of the intertrochanteric fracture with a trochanteric nail and lag screw apparatus, successfully bridging the fracture and with expected orientation and appearance. No new fracture. Avulsed lesser trochanter noted.  Lower lumbar spondylosis. Mild to moderate degenerative arthropathy of both hips.  IMPRESSION: 1. Intramedullary nail placement for left hip trochanteric fracture, without complicating feature observed.   Electronically Signed   By: Sherryl Barters M.D.   On: 01/23/2014 08:34    Scheduled Meds: . docusate sodium  100 mg Oral BID  . enoxaparin (LOVENOX) injection  40 mg Subcutaneous Q24H   . ferrous sulfate  325 mg Oral TID PC  . tamsulosin  0.8 mg Oral Daily   Continuous Infusions: . lactated ringers 10 mL/hr at 01/22/14 1558  . lactated ringers 85 mL/hr at 01/22/14 2150       Time spent: 35 minutes    [facility] A  [facility] Pager [phone] If 7PM-7AM, please contact night-coverage at [email or web address], password [facility] 01/24/2014, 9:42 AM  LOS: 2 days

## 2014-01-25 DIAGNOSIS — R338 Other retention of urine: Secondary | ICD-10-CM

## 2014-01-25 LAB — CBC
HCT: 28.1 % — ABNORMAL LOW (ref 39.0–52.0)
HCT: 29.4 % — ABNORMAL LOW (ref 39.0–52.0)
HEMOGLOBIN: 9.5 g/dL — AB (ref 13.0–17.0)
Hemoglobin: 9.9 g/dL — ABNORMAL LOW (ref 13.0–17.0)
MCH: 31.4 pg (ref 26.0–34.0)
MCH: 30.4 pg (ref 26.0–34.0)
MCHC: 33.8 g/dL (ref 30.0–36.0)
MCHC: 33.7 g/dL (ref 30.0–36.0)
MCV: 92.7 fL (ref 78.0–100.0)
MCV: 90.2 fL (ref 78.0–100.0)
PLATELETS: DECREASED 10*3/uL (ref 150–400)
PLATELETS: 106 10*3/uL — AB (ref 150–400)
RBC: 3.03 MIL/uL — AB (ref 4.22–5.81)
RBC: 3.26 MIL/uL — ABNORMAL LOW (ref 4.22–5.81)
RDW: 14.4 % (ref 11.5–15.5)
RDW: 14.3 % (ref 11.5–15.5)
WBC: 5.1 10*3/uL (ref 4.0–10.5)
WBC: 5.4 10*3/uL (ref 4.0–10.5)

## 2014-01-25 LAB — BASIC METABOLIC PANEL
Anion gap: 6 (ref 5–15)
BUN: 8 mg/dL (ref 6–23)
CHLORIDE: 102 meq/L (ref 96–112)
CO2: 27 mmol/L (ref 19–32)
Calcium: 8.1 mg/dL — ABNORMAL LOW (ref 8.4–10.5)
Creatinine, Ser: 0.86 mg/dL (ref 0.50–1.35)
GFR calc Af Amer: >90 mL/min (ref >90)
GFR calc non Af Amer: 83 mL/min — ABNORMAL LOW (ref >90)
Glucose, Bld: 133 mg/dL — ABNORMAL HIGH (ref 70–99)
Potassium: 3.6 mmol/L (ref 3.5–5.1)
SODIUM: 135 mmol/L (ref 135–145)

## 2014-01-25 LAB — HEMOGLOBIN A1C
HEMOGLOBIN A1C: 6 % — AB (ref <5.7)
Mean Plasma Glucose: 126 mg/dL — ABNORMAL HIGH (ref <117)

## 2014-01-25 MED ORDER — POTASSIUM CHLORIDE CRYS ER 20 MEQ PO TBCR
40.0000 meq | EXTENDED_RELEASE_TABLET | Freq: Once | ORAL | Status: AC
  Administered 2014-01-25: 40 meq via ORAL
  Filled 2014-01-25: qty 2

## 2014-01-25 MED ORDER — TAMSULOSIN HCL 0.4 MG PO CAPS: 0.8000 mg | ORAL_CAPSULE | Freq: Every day | ORAL | Status: DC

## 2014-01-25 NOTE — Discharge Summary (Signed)
Physician Discharge Summary  [name] [name] SVX:[id number] DOB: Mar 18, 1940 DOA: 01/22/2014  PCP: No primary care provider on file.  Admit date: 01/22/2014 Discharge date: 01/26/2014  Time spent: 40 minutes  Recommendations for Outpatient Follow-up:  1. Follow-up with Dr. Rolena Infante in 2 weeks. 2. Need follow-up with urology in 1 week, patient discharged with coud catheter and increased dose of Flomax.  Discharge Diagnoses:  Principal Problem:   Hip fracture, left Active Problems:   BPH (benign prostatic hypertrophy)   Hip fracture   Acute urinary retention   Discharge Condition: Stable  Diet recommendation: Heart healthy  Filed Weights   01/25/14 0531  Weight: 95.255 kg (210 lb)    History of present illness:  Patient is a 73 year old male with BPH presented to ED at [facility] with mechanical fall and left hip fracture. Patient was seen by orthopedics and recommended transfer to [facility] for left hip surgery. Patient was seen in the [facility] perioperative area prior to the surgery. History was obtained from the patient who reported that he was coming down the stairs at home when he accidentally missed a step and landed on his left hip. Patient denied any chest pain, shortness of breath, dizziness or any syncopal episode. X-ray of the left hip showed comminuted left intertrochanteric femur fracture. Orthopedics recommended medical admission and will need surgery for the hip fracture repair.  Hospital Course:   Left hip fracture Presented with a fall and sustained a left intertrochanteric hip fracture. Orthopedics consulted, ORIF with left intertrochanteric nailing done by Dr. Rolena Infante on 12/24. Up with PT/OT. Pain is controlled with narcotics. DVT prophylaxis with subcutaneous Lovenox.  Acute urinary retention/history of BPH She developed urinary retention since 12/24, bladder scan showed 620 mL. Likely secondary to history of BPH and now narcotics. Has history of BPH,increased  Flomax dose. Coude catheter placed by nursing staff. Coud catheter discontinued on 12/27, placed back on the discharge date because of recurring of the urinary retention. Patient will be discharged with the catheter in place.  History of BPH Discharge on increased dose of 0.8 mg of Flomax. Patient will need outpatient follow-up with urology.  Procedures:  ORIF with left hip intertrochanteric nailing done by Dr. Rolena Infante on 01/22/2014.  Consultations:  Orthopedics  Discharge Exam: Filed Vitals:   01/26/14 0515  BP: 135/60  Pulse: 99  Temp: 98.2 F (36.8 C)  Resp: 18   General: Alert and awake, oriented x3, not in any acute distress. HEENT: anicteric sclera, pupils reactive to light and accommodation, EOMI CVS: S1-S2 clear, no murmur rubs or gallops Chest: clear to auscultation bilaterally, no wheezing, rales or rhonchi Abdomen: soft nontender, nondistended, normal bowel sounds, no organomegaly Extremities: no cyanosis, clubbing or edema noted bilaterally Neuro: Cranial nerves II-XII intact, no focal neurological deficits  Discharge Instructions   Discharge Instructions    Diet - low sodium heart healthy    Complete by:  As directed      Increase activity slowly    Complete by:  As directed      Weight bearing as tolerated    Complete by:  As directed   Laterality:  left  Extremity:  Lower          Current Discharge Medication List    START taking these medications   Details  enoxaparin (LOVENOX) 40 MG/0.4ML injection Inject 0.4 mLs (40 mg total) into the skin daily. Qty: 7 Syringe, Refills: 0    oxyCODONE (OXY IR/ROXICODONE) 5 MG immediate release tablet Take 1-2  tablets (5-10 mg total) by mouth every 4 (four) hours as needed for breakthrough pain ((for MODERATE breakthrough pain)). Qty: 60 tablet, Refills: 0      CONTINUE these medications which have CHANGED   Details  tamsulosin (FLOMAX) 0.4 MG CAPS capsule Take 2 capsules (0.8 mg total) by mouth daily.       STOP taking these medications     levofloxacin (LEVAQUIN) 750 MG tablet        No Known Allergies Follow-up Information    Follow up with Dahlia Bailiff, MD. Schedule an appointment as soon as possible for a visit in 2 weeks.   Specialty:  Orthopedic Surgery   Why:  For wound re-check   Contact information:   [address] [place] [address] [place] [address] [phone]        The results of significant diagnostics from this hospitalization (including imaging, microbiology, ancillary and laboratory) are listed below for reference.    Significant Diagnostic Studies: Dg Chest 1 View  01/22/2014   CLINICAL DATA:  Fall, left hip pain  EXAM: CHEST - 1 VIEW  COMPARISON:  CT chest dated 07/17/2008  FINDINGS: Lungs are essentially clear. Biapical pleural parenchymal scarring. No focal consolidation. No pleural effusion or pneumothorax.  The heart is normal in size.  Degenerative changes of the visualized thoracolumbar spine.  IMPRESSION: No evidence of acute cardiopulmonary disease.   Electronically Signed   By: Julian Hy M.D.   On: 01/22/2014 12:34   Dg Hip Complete Left  01/22/2014   CLINICAL DATA:  Fall  EXAM: LEFT HIP - COMPLETE 2+ VIEW  COMPARISON:  None.  FINDINGS: Comminuted left intertrochanteric femur fracture with some deformity is present. Lesser trochanter is somewhat displaced. Vascular calcifications are noted. Osteopenia. Degenerative changes in the lumbar spine and right hip joint are noted.  IMPRESSION: Comminuted left intertrochanteric femur fracture.   Electronically Signed   By: Maryclare Bean M.D.   On: 01/22/2014 12:38   Dg Hip Operative Left  01/22/2014   CLINICAL DATA:  Acute left hip intertrochanteric fracture, operative fixation  EXAM: OPERATIVE LEFT HIP  COMPARISON:  01/22/2014  FINDINGS: Proximal left femur intra medullary rod and screw fixation reduce the intertrochanteric fracture with improved alignment. Fracture lines remain visible. Small  displaced lesser trochanter fragment again noted. No complicating feature.  IMPRESSION: Status post ORIF for the acute left hip intertrochanteric fracture with improved alignment.   Electronically Signed   By: Daryll Brod M.D.   On: 01/22/2014 17:46   Pelvis Portable  01/23/2014   CLINICAL DATA:  Operative reduction and internal fixation of femur fracture.  EXAM: PORTABLE PELVIS 1-2 VIEWS  COMPARISON:  Multiple exams, including 01/22/2014  FINDINGS: Fixation of the intertrochanteric fracture with a trochanteric nail and lag screw apparatus, successfully bridging the fracture and with expected orientation and appearance. No new fracture. Avulsed lesser trochanter noted.  Lower lumbar spondylosis. Mild to moderate degenerative arthropathy of both hips.  IMPRESSION: 1. Intramedullary nail placement for left hip trochanteric fracture, without complicating feature observed.   Electronically Signed   By: Sherryl Barters M.D.   On: 01/23/2014 08:34    Microbiology: No results found for this or any previous visit (from the past 240 hour(s)).   Labs: Basic Metabolic Panel:  Recent Labs Lab 01/22/14 1321 01/22/14 1429 01/22/14 2005 01/23/14 0537 01/24/14 0450 01/25/14 0530  NA 136 139  --  133* 134* 135  K 3.8 3.8  --  3.8 3.8 3.6  CL 101 101  --  101 101 102  CO2 28  --   --  26 28 27   GLUCOSE 115* 112*  --  127* 124* 133*  BUN 14 13  --  11 12 8   CREATININE 0.89 0.90 0.94 0.85 1.02 0.86  CALCIUM 8.8  --   --  8.4 8.1* 8.1*   Liver Function Tests:  Recent Labs Lab 01/22/14 1321  AST 15  ALT 13  ALKPHOS 49  BILITOT 0.4  PROT 7.5  ALBUMIN 3.8   No results for input(s): LIPASE, AMYLASE in the last 168 hours. No results for input(s): AMMONIA in the last 168 hours. CBC:  Recent Labs Lab 01/22/14 1321  01/22/14 2005 01/23/14 0537 01/24/14 0450 01/25/14 0530 01/25/14 0955  WBC 9.6  --  5.5 4.9 5.6 5.1 5.4  NEUTROABS 8.0*  --   --   --   --   --   --   HGB 13.5  < > 12.0*  11.8* 10.2* 9.5* 9.9*  HCT 39.5  < > 35.4* 34.1* 30.5* 28.1* 29.4*  MCV 92.7  --  92.4 91.7 92.1 92.7 90.2  PLT PLATELET CLUMPS NOTED ON SMEAR, UNABLE TO ESTIMATE  --  PLATELET CLUMPS NOTED ON SMEAR, UNABLE TO ESTIMATE PLATELET CLUMPS NOTED ON SMEAR, UNABLE TO ESTIMATE PLATELET CLUMPS NOTED ON SMEAR, COUNT APPEARS DECREASED PLATELET CLUMPS NOTED ON SMEAR, COUNT APPEARS DECREASED 106*  < > = values in this interval not displayed. Cardiac Enzymes: No results for input(s): CKTOTAL, CKMB, CKMBINDEX, TROPONINI in the last 168 hours. BNP: BNP (last 3 results) No results for input(s): PROBNP in the last 8760 hours. CBG: No results for input(s): GLUCAP in the last 168 hours.     Signed:  Vibha Ferdig A  [facility] 01/26/2014, 12:26 PM

## 2014-01-25 NOTE — Progress Notes (Signed)
Physical Therapy Treatment Patient Details Name: [name] [name] MRN: [id number] DOB: [birth year] Today's Date: 01/25/2014    History of Present Illness 73 yo male with fall and resulting L hip femoral fracture, IM nailing with PMHx:  BPH and back surgery    PT Comments    Excellent improvement in all aspects of mobility compared to last session; If progress continues at this rate, we can consider dc'ing home -- will need more info re: available home assist  Follow Up Recommendations  SNF;Supervision/Assistance - 24 hour     Equipment Recommendations  Rolling walker with 5" wheels;3in1 (PT)    Recommendations for Other Services       Precautions / Restrictions Precautions Precautions: Fall Restrictions LLE Weight Bearing: Weight bearing as tolerated    Mobility  Bed Mobility Overal bed mobility: Needs Assistance Bed Mobility: Supine to Sit     Supine to sit: Min assist     General bed mobility comments: Pt used a sheet roll as a leg lifter pretty effectively during transition ot sit; cues for technique; slow moving, but apparently much improved from yesterday  Transfers Overall transfer level: Needs assistance Equipment used: Rolling walker (2 wheeled) Transfers: Sit to/from Stand Sit to Stand: Mod assist         General transfer comment: Cues for hand placement and technqiue; tending to pull up on RW, and requiring assist to steady RW  Ambulation/Gait Ambulation/Gait assistance: Min guard (second person pushing chair) Ambulation Distance (Feet): 35 Feet Assistive device: Rolling walker (2 wheeled) Gait Pattern/deviations: Step-through pattern Gait velocity: quite slow`   General Gait Details: Cues for gait sequence and technqiue; muvh improved `   Stairs            Wheelchair Mobility    Modified Rankin (Stroke Patients Only)       Balance     Sitting balance-Leahy Scale: Fair       Standing balance-Leahy Scale: Poor                      Cognition Arousal/Alertness: Awake/alert Behavior During Therapy: WFL for tasks assessed/performed Overall Cognitive Status: Within Functional Limits for tasks assessed                      Exercises      General Comments        Pertinent Vitals/Pain Pain Assessment: 0-10 Pain Score: 5  Pain Location: L hip Pain Descriptors / Indicators: Aching Pain Intervention(s): Limited activity within patient's tolerance;Monitored during session;Repositioned;RN gave pain meds during session    Home Living                      Prior Function            PT Goals (current goals can now be found in the care plan section) Acute Rehab PT Goals Patient Stated Goal: to reduce pain  PT Goal Formulation: With patient Time For Goal Achievement: 02/07/14 Potential to Achieve Goals: Good Progress towards PT goals: Progressing toward goals    Frequency  Min 5X/week    PT Plan Current plan remains appropriate    Co-evaluation             End of Session Equipment Utilized During Treatment: Gait belt Activity Tolerance: Patient tolerated treatment well Patient left: in chair;with call bell/phone within reach     Time: 0917-0938 PT Time Calculation (min) (ACUTE ONLY): 21 min  Charges:  $Gait  Training: 8-22 mins                    G Codes:      Roney Marion [facility] 01/25/2014, 10:46 AM  Roney Marion, PT  Acute Rehabilitation Services Pager [phone] Office [phone]

## 2014-01-25 NOTE — Progress Notes (Signed)
[facility] PROGRESS NOTE   [name] [name] NOB:[id number] DOB: 04-22-1940 DOA: 01/22/2014 PCP: No primary care provider on file.  HPI/Subjective: Minimal pain, denies any significant complaints. We'll DC the Coude catheter and start voiding trials.  Assessment/Plan: Principal Problem:   Hip fracture, left Active Problems:   BPH (benign prostatic hypertrophy)   Hip fracture   Acute urinary retention    Left hip fracture Presented with a fall and sustained a left intertrochanteric hip fracture. Orthopedics consulted, ORIF with left intertrochanteric nailing done by Dr. Rolena Infante on 12/24. Up with PT/OT. Pain is controlled with narcotics. DVT prophylaxis with subcutaneous Lovenox.  Acute urinary retention/history of BPH She developed urinary retention since yesterday, last bladder scan was 620 mL. Has history of BPH, will increase Flomax dose. Coude catheter placed by nursing staff. Discontinue the coud catheter today and start voiding trials.  Code Status: Full  Family Communication: Plan discussed with the patient. Disposition Plan: Remains inpatient   Consultants:  Orthopedics  Procedures:  ORIF with left hip intertrochanteric nailing done by Dr. Rolena Infante  Antibiotics:  None   Objective: Filed Vitals:   01/25/14 0531  BP: 116/55  Pulse: 91  Temp: 98.8 F (37.1 C)  Resp: 18    Intake/Output Summary (Last 24 hours) at 01/25/14 1118 Last data filed at 01/25/14 0908  Gross per 24 hour  Intake   1080 ml  Output   1050 ml  Net     30 ml   Filed Weights   01/25/14 0531  Weight: 95.255 kg (210 lb)    Exam: General: Alert and awake, oriented x3, not in any acute distress. HEENT: anicteric sclera, pupils reactive to light and accommodation, EOMI CVS: S1-S2 clear, no murmur rubs or gallops Chest: clear to auscultation bilaterally, no wheezing, rales or rhonchi Abdomen: soft nontender, nondistended, normal bowel sounds, no  organomegaly Extremities: no cyanosis, clubbing or edema noted bilaterally Neuro: Cranial nerves II-XII intact, no focal neurological deficits  Data Reviewed: Basic Metabolic Panel:  Recent Labs Lab 01/22/14 1321 01/22/14 1429 01/22/14 2005 01/23/14 0537 01/24/14 0450 01/25/14 0530  NA 136 139  --  133* 134* 135  K 3.8 3.8  --  3.8 3.8 3.6  CL 101 101  --  101 101 102  CO2 28  --   --  26 28 27   GLUCOSE 115* 112*  --  127* 124* 133*  BUN 14 13  --  11 12 8   CREATININE 0.89 0.90 0.94 0.85 1.02 0.86  CALCIUM 8.8  --   --  8.4 8.1* 8.1*   Liver Function Tests:  Recent Labs Lab 01/22/14 1321  AST 15  ALT 13  ALKPHOS 49  BILITOT 0.4  PROT 7.5  ALBUMIN 3.8   No results for input(s): LIPASE, AMYLASE in the last 168 hours. No results for input(s): AMMONIA in the last 168 hours. CBC:  Recent Labs Lab 01/22/14 1321  01/22/14 2005 01/23/14 0537 01/24/14 0450 01/25/14 0530 01/25/14 0955  WBC 9.6  --  5.5 4.9 5.6 5.1 5.4  NEUTROABS 8.0*  --   --   --   --   --   --   HGB 13.5  < > 12.0* 11.8* 10.2* 9.5* 9.9*  HCT 39.5  < > 35.4* 34.1* 30.5* 28.1* 29.4*  MCV 92.7  --  92.4 91.7 92.1 92.7 90.2  PLT PLATELET CLUMPS NOTED ON SMEAR, UNABLE TO ESTIMATE  --  PLATELET CLUMPS NOTED ON SMEAR, UNABLE TO ESTIMATE PLATELET CLUMPS NOTED ON  SMEAR, UNABLE TO ESTIMATE PLATELET CLUMPS NOTED ON SMEAR, COUNT APPEARS DECREASED PLATELET CLUMPS NOTED ON SMEAR, COUNT APPEARS DECREASED PENDING  < > = values in this interval not displayed. Cardiac Enzymes: No results for input(s): CKTOTAL, CKMB, CKMBINDEX, TROPONINI in the last 168 hours. BNP (last 3 results) No results for input(s): PROBNP in the last 8760 hours. CBG: No results for input(s): GLUCAP in the last 168 hours.  Micro No results found for this or any previous visit (from the past 240 hour(s)).   Studies: No results found.  Scheduled Meds: . docusate sodium  100 mg Oral BID  . enoxaparin (LOVENOX) injection  40 mg  Subcutaneous Q24H  . ferrous sulfate  325 mg Oral TID PC  . tamsulosin  0.8 mg Oral Daily   Continuous Infusions: . lactated ringers 20 mL/hr at 01/25/14 0600       Time spent: 35 minutes    [facility]  [facility] Pager [phone] If 7PM-7AM, please contact night-coverage at [email or web address], password [facility] 01/25/2014, 11:18 AM  LOS: 3 days

## 2014-01-25 NOTE — Progress Notes (Signed)
I&O performed on patient d/t no voiding, urine amber in color with three blood clots.  The largest being 1cm.

## 2014-01-25 NOTE — Progress Notes (Signed)
[name] [name]  MRN: [id number] DOB/Age: 1941/01/07 73 y.o. Physician: Rada Hay Procedure: Procedure(s) (LRB): INTRAMEDULLARY (IM) NAIL INTERTROCHANTRIC (Left)     Subjective: Up with therapy, making more progress but still slow.  Vital Signs Temp:  [98.2 F (36.8 C)-98.8 F (37.1 C)] 98.8 F (37.1 C) (12/27 0531) Pulse Rate:  [91-96] 91 (12/27 0531) Resp:  [16-18] 18 (12/27 0531) BP: (116-127)/(55-67) 116/55 mmHg (12/27 0531) SpO2:  [94 %-97 %] 94 % (12/27 0531) Weight:  [95.255 kg (210 lb)] 95.255 kg (210 lb) (12/27 0531)  Lab Results  Recent Labs  01/24/14 0450 01/25/14 0530  WBC 5.6 5.1  HGB 10.2* 9.5*  HCT 30.5* 28.1*  PLT PLATELET CLUMPS NOTED ON SMEAR, COUNT APPEARS DECREASED PLATELET CLUMPS NOTED ON SMEAR, COUNT APPEARS DECREASED   BMET  Recent Labs  01/24/14 0450 01/25/14 0530  NA 134* 135  K 3.8 3.6  CL 101 102  CO2 28 27  GLUCOSE 124* 133*  BUN 12 8  CREATININE 1.02 0.86  CALCIUM 8.1* 8.1*   INR  Date Value Ref Range Status  01/22/2014 1.03 0.00 - 1.49 Final     Exam Left hip dressing with scant drainage.        Plan Voiding trial and DC foley. Continue mobilizing  [name] [name] for Dr.Kevin Supple 01/25/2014, 9:29 AM

## 2014-01-26 ENCOUNTER — Encounter (HOSPITAL_COMMUNITY): Payer: Self-pay | Admitting: Orthopedic Surgery

## 2014-01-26 DIAGNOSIS — S72009S Fracture of unspecified part of neck of unspecified femur, sequela: Secondary | ICD-10-CM | POA: Diagnosis not present

## 2014-01-26 DIAGNOSIS — D696 Thrombocytopenia, unspecified: Secondary | ICD-10-CM | POA: Diagnosis not present

## 2014-01-26 DIAGNOSIS — I2699 Other pulmonary embolism without acute cor pulmonale: Secondary | ICD-10-CM | POA: Diagnosis not present

## 2014-01-26 DIAGNOSIS — I951 Orthostatic hypotension: Secondary | ICD-10-CM | POA: Diagnosis present

## 2014-01-26 DIAGNOSIS — N400 Enlarged prostate without lower urinary tract symptoms: Secondary | ICD-10-CM | POA: Diagnosis not present

## 2014-01-26 DIAGNOSIS — I82409 Acute embolism and thrombosis of unspecified deep veins of unspecified lower extremity: Secondary | ICD-10-CM | POA: Diagnosis not present

## 2014-01-26 DIAGNOSIS — S72146A Nondisplaced intertrochanteric fracture of unspecified femur, initial encounter for closed fracture: Secondary | ICD-10-CM | POA: Diagnosis not present

## 2014-01-26 DIAGNOSIS — S72002A Fracture of unspecified part of neck of left femur, initial encounter for closed fracture: Secondary | ICD-10-CM | POA: Diagnosis not present

## 2014-01-26 DIAGNOSIS — R350 Frequency of micturition: Secondary | ICD-10-CM | POA: Diagnosis not present

## 2014-01-26 DIAGNOSIS — R791 Abnormal coagulation profile: Secondary | ICD-10-CM | POA: Diagnosis not present

## 2014-01-26 DIAGNOSIS — R404 Transient alteration of awareness: Secondary | ICD-10-CM | POA: Diagnosis not present

## 2014-01-26 DIAGNOSIS — N401 Enlarged prostate with lower urinary tract symptoms: Secondary | ICD-10-CM | POA: Diagnosis not present

## 2014-01-26 DIAGNOSIS — I82432 Acute embolism and thrombosis of left popliteal vein: Secondary | ICD-10-CM | POA: Diagnosis present

## 2014-01-26 DIAGNOSIS — R339 Retention of urine, unspecified: Secondary | ICD-10-CM | POA: Diagnosis not present

## 2014-01-26 DIAGNOSIS — F17210 Nicotine dependence, cigarettes, uncomplicated: Secondary | ICD-10-CM | POA: Diagnosis present

## 2014-01-26 DIAGNOSIS — R55 Syncope and collapse: Secondary | ICD-10-CM | POA: Diagnosis not present

## 2014-01-26 DIAGNOSIS — R351 Nocturia: Secondary | ICD-10-CM | POA: Diagnosis not present

## 2014-01-26 DIAGNOSIS — R001 Bradycardia, unspecified: Secondary | ICD-10-CM | POA: Diagnosis not present

## 2014-01-26 DIAGNOSIS — R338 Other retention of urine: Secondary | ICD-10-CM | POA: Diagnosis not present

## 2014-01-26 LAB — BASIC METABOLIC PANEL
Anion gap: 6 (ref 5–15)
BUN: 6 mg/dL (ref 6–23)
CO2: 28 mmol/L (ref 19–32)
CREATININE: 0.81 mg/dL (ref 0.50–1.35)
Calcium: 8.6 mg/dL (ref 8.4–10.5)
Chloride: 101 mEq/L (ref 96–112)
GFR calc Af Amer: >90 mL/min (ref >90)
GFR, EST NON AFRICAN AMERICAN: 85 mL/min — AB (ref >90)
GLUCOSE: 131 mg/dL — AB (ref 70–99)
Potassium: 3.9 mmol/L (ref 3.5–5.1)
Sodium: 135 mmol/L (ref 135–145)

## 2014-01-26 NOTE — Progress Notes (Signed)
Attempt to call report at [facility] - no answer.  1800-attempt to call report as above.- no answer.

## 2014-01-26 NOTE — Clinical Social Work Note (Signed)
Patient to dc today, per MD order to [place] RN to call report prior to transportation to: [phone] Transportation: [facility] (scheduled for 3pm per RN request)  Patient is alert and oriented.  Plans were reviewed with patient.  Patient agreeable.  Nonnie Done, [place] [phone]  Psychiatric & Orthopedics (5N 1-16) Clinical Social Worker

## 2014-01-26 NOTE — Progress Notes (Signed)
0600 /pt is more comfortable although he continues to void in small amounts. Bladder scan showed only 100 cc urine. Pt does not want any more in and out catheterizations. No blood clots noted in urine this evening.

## 2014-01-26 NOTE — Clinical Social Work Placement (Signed)
Clinical Social Work Department CLINICAL SOCIAL WORK PLACEMENT NOTE 01/26/2014  Patient:  [name] [name], [name] [name]  Account Number:  [id number] Admit date:  01/22/2014  Clinical Social Worker:  Wylene Men  Date/time:  01/26/2014 02:29 PM  Clinical Social Work is seeking post-discharge placement for this patient at the following level of care:   [place]   (*CSW will update this form in Epic as items are completed)   01/26/2014  Patient/family provided with [place] Department of Clinical Social Work's list of facilities offering this level of care within the geographic area requested by the patient (or if unable, by the patient's family).  01/26/2014  Patient/family informed of their freedom to choose among providers that offer the needed level of care, that participate in Medicare, Medicaid or managed care program needed by the patient, have an available bed and are willing to accept the patient.  01/26/2014  Patient/family informed of [facility]' ownership interest in [facility], as well as of the fact that they are under no obligation to receive care at this facility.  PASARR submitted to EDS on 01/26/2014 PASARR number received on 01/26/2014  FL2 transmitted to all facilities in geographic area requested by pt/family on  01/26/2014 FL2 transmitted to all facilities within larger geographic area on   Patient informed that his/her managed care company has contracts with or will negotiate with  certain facilities, including the following:     Patient/family informed of bed offers received:  01/26/2014 Patient chooses bed at [facility] [place] Physician recommends and patient chooses bed at    Patient to be transferred to [facility] [place] on  01/26/2014 Patient to be transferred to facility by [facility] Patient and family notified of transfer on 01/26/2014 Name of family member notified:  patient is alert and oriented and reports he will  update wif  The following physician request were entered in Epic:   Additional Comments:   Nonnie Done, [place] [phone]  Psychiatric & Orthopedics (5N 1-16) Clinical Social Worker

## 2014-01-26 NOTE — Progress Notes (Signed)
Utilization review completed. Christal Lagerstrom, RN, BSN. 

## 2014-01-26 NOTE — Clinical Social Work Psychosocial (Signed)
Clinical Social Work Department BRIEF PSYCHOSOCIAL ASSESSMENT 01/26/2014  Patient:  [name] [name], [name] [name]     Account Number:  [id number]     Admit date:  01/22/2014  Clinical Social Worker:  Wylene Men  Date/Time:  01/26/2014 02:25 PM  Referred by:  Physician  Date Referred:  01/26/2014 Referred for  SNF Placement  Psychosocial assessment   Other Referral:   none   Interview type:  Patient Other interview type:   none    PSYCHOSOCIAL DATA Living Status:  WIFE Admitted from facility:   Level of care:   Primary support name:  Pamala Hurry Primary support relationship to patient:  SPOUSE Degree of support available:   adequate    CURRENT CONCERNS Current Concerns  Post-Acute Placement   Other Concerns:   none    SOCIAL WORK ASSESSMENT / PLAN CSW assessed pt at bedside. patient was alert and oriented x4 during the course of this assessment.  PT is recommending SNF/STR at time of discharge.  Patient is aware and agreeable to placement in [facility] at this time.  Patient is from home with spouse.  Patient is hopeful to return to living independently after completion of STR.  Patient requests [facility] transportation at time of discharge.   Assessment/plan status:  Psychosocial Support/Ongoing Assessment of Needs Other assessment/ plan:   FL2  PASARR   Information/referral to community resources:   SNF    PATIENT'S/FAMILY'S RESPONSE TO PLAN OF CARE: patient is agreeable to SNF search. Patient chooses [facility] and will need [facility].       Nonnie Done, [place] [phone]  Psychiatric & Orthopedics (5N 1-16) Clinical Social Worker

## 2014-01-26 NOTE — Progress Notes (Signed)
Pt continues to complain of pain when trying to void. He is voiding amber urine in small amounts. Bladder scan showed less than 200 cc of urine in bladder. Heat pack applied to pubic area. Pt walked to BR for BM, but he continued to urinate in small amounts and with pain.

## 2014-01-29 ENCOUNTER — Non-Acute Institutional Stay (SKILLED_NURSING_FACILITY): Payer: 59 | Admitting: Internal Medicine

## 2014-01-29 DIAGNOSIS — N400 Enlarged prostate without lower urinary tract symptoms: Secondary | ICD-10-CM

## 2014-01-29 DIAGNOSIS — S72009S Fracture of unspecified part of neck of unspecified femur, sequela: Secondary | ICD-10-CM

## 2014-01-29 DIAGNOSIS — R338 Other retention of urine: Secondary | ICD-10-CM | POA: Diagnosis not present

## 2014-01-30 DIAGNOSIS — I2699 Other pulmonary embolism without acute cor pulmonale: Secondary | ICD-10-CM

## 2014-01-30 DIAGNOSIS — Z86718 Personal history of other venous thrombosis and embolism: Secondary | ICD-10-CM

## 2014-01-30 HISTORY — DX: Personal history of other venous thrombosis and embolism: Z86.718

## 2014-01-30 HISTORY — DX: Other pulmonary embolism without acute cor pulmonale: I26.99

## 2014-02-06 ENCOUNTER — Encounter: Payer: Self-pay | Admitting: Internal Medicine

## 2014-02-06 NOTE — Progress Notes (Signed)
Patient ID: [name] [name], male   DOB: Jan 27, 1941, 74 y.o.   MRN: [id number]    HISTORY AND PHYSICAL  Location:  [place] of Service: SNF [phone])   Extended Emergency Contact Information Primary Emergency Contact: Jaqualyn, Juday Address: [address]          [place] [address] [place] Litter of [place] Phone: [phone] Relation: Spouse  Advanced Directive information  Full code/ MOST form on chart  Chief Complaint  Patient presents with  . New Admit To SNF    left hip fx due to fall down stairs s/p IM nail, BPH with acute urinary retention now with foley cath    HPI:  74 yo male sen today as a new admit to SNF for above. He missed the bottom step of his stairs and fell down and hit the corner of the steps. He has an Ortho appt for f/u soon. Pain is 10/10 on scale with any movement. He is taking 2 oxycodone tabs BID-TID. He has constipation on low dose colace. He has a foley cath and will see urology on Jan 10th.  No past medical history on file.  Past Surgical History  Procedure Laterality Date  . Back surgery    . Intramedullary (im) nail intertrochanteric Left 01/22/2014    Procedure: INTRAMEDULLARY (IM) NAIL INTERTROCHANTRIC;  Surgeon: Melina Schools, MD;  Location: [facility];  Service: Orthopedics;  Laterality: Left;    No care team member to display  History   Social History  . Marital Status: Single    Spouse Name: N/A    Number of Children: N/A  . Years of Education: N/A   Occupational History  . Not on file.   Social History Main Topics  . Smoking status: Current Every Day Smoker -- 1.50 packs/day    Types: Cigarettes  . Smokeless tobacco: Not on file  . Alcohol Use: No  . Drug Use: Not on file  . Sexual Activity: No   Other Topics Concern  . Not on file   Social History Narrative     reports that he has been smoking Cigarettes.  He has been smoking about 1.50 packs per day. He does not have any smokeless tobacco  history on file. He reports that he does not drink alcohol. His drug history is not on file.  No family history on file. No family status information on file.     There is no immunization history on file for this patient.  No Known Allergies  Medications: Patient's Medications  New Prescriptions   No medications on file  Previous Medications   ENOXAPARIN (LOVENOX) 40 MG/0.4ML INJECTION    Inject 0.4 mLs (40 mg total) into the skin daily.   OXYCODONE (OXY IR/ROXICODONE) 5 MG IMMEDIATE RELEASE TABLET    Take 1-2 tablets (5-10 mg total) by mouth every 4 (four) hours as needed for breakthrough pain ((for MODERATE breakthrough pain)).   TAMSULOSIN (FLOMAX) 0.4 MG CAPS CAPSULE    Take 2 capsules (0.8 mg total) by mouth daily.  Modified Medications   No medications on file  Discontinued Medications   No medications on file    Review of Systems  As above. All other systems reviewed are negative  Filed Vitals:   02/06/14 1320  BP: 114/70  Pulse: 86  Temp: 98.2 F (36.8 C)  Weight: 200 lb (90.719 kg)  SpO2: 98%   Body mass index is 27.12 kg/(m^2).  Physical Exam  CONSTITUTIONAL: Looks  uncomfortable in NAD. Awake, alert and oriented x 3 HEENT: PERRLA. Oropharynx clear and without exudate. MMM NECK: Supple. Nontender. No palpable cervical or supraclavicular lymph nodes. No carotid bruit b/l.  CVS: Regular rate without murmur, gallop or rub. LUNGS: CTA b/l no wheezing, rales or rhonchi. ABDOMEN: Bowel sounds present x 4. Soft, nontender, nondistended. No palpable mass or bruit. Abdominal fullness noted. EXTREMITIES: No edema b/l. Distal pulses palpable. No calf tenderness. Left lateral thigh bandage intact and clean. GU: foley cath intact with clear yellow urine DTG PSYCH: Affect, behavior and mood normal  Labs reviewed: Admission on 01/22/2014, Discharged on 01/26/2014  Component Date Value Ref Range Status  . WBC 01/22/2014 9.6  4.0 - 10.5 K/uL Final   WHITE COUNT  CONFIRMED ON SMEAR  . RBC 01/22/2014 4.26  4.22 - 5.81 MIL/uL Final  . Hemoglobin 01/22/2014 13.5  13.0 - 17.0 g/dL Final  . HCT 01/22/2014 39.5  39.0 - 52.0 % Final  . MCV 01/22/2014 92.7  78.0 - 100.0 fL Final  . MCH 01/22/2014 31.7  26.0 - 34.0 pg Final  . MCHC 01/22/2014 34.2  30.0 - 36.0 g/dL Final  . RDW 01/22/2014 14.2  11.5 - 15.5 % Final  . Platelets 01/22/2014 PLATELET CLUMPS NOTED ON SMEAR, UNABLE TO ESTIMATE  150 - 400 K/uL Final  . Neutrophils Relative % 01/22/2014 84* 43 - 77 % Final  . Lymphocytes Relative 01/22/2014 8* 12 - 46 % Final  . Monocytes Relative 01/22/2014 8  3 - 12 % Final  . Eosinophils Relative 01/22/2014 0  0 - 5 % Final  . Basophils Relative 01/22/2014 0  0 - 1 % Final  . Neutro Abs 01/22/2014 8.0* 1.7 - 7.7 K/uL Final  . Lymphs Abs 01/22/2014 0.8  0.7 - 4.0 K/uL Final  . Monocytes Absolute 01/22/2014 0.8  0.1 - 1.0 K/uL Final  . Eosinophils Absolute 01/22/2014 0.0  0.0 - 0.7 K/uL Final  . Basophils Absolute 01/22/2014 0.0  0.0 - 0.1 K/uL Final  . Smear Review 01/22/2014 MORPHOLOGY UNREMARKABLE   Final  . Sodium 01/22/2014 136  135 - 145 mmol/L Final   Please note change in reference range.  . Potassium 01/22/2014 3.8  3.5 - 5.1 mmol/L Final   Please note change in reference range.  . Chloride 01/22/2014 101  96 - 112 mEq/L Final  . CO2 01/22/2014 28  19 - 32 mmol/L Final  . Glucose, Bld 01/22/2014 115* 70 - 99 mg/dL Final  . BUN 01/22/2014 14  6 - 23 mg/dL Final  . Creatinine, Ser 01/22/2014 0.89  0.50 - 1.35 mg/dL Final  . Calcium 01/22/2014 8.8  8.4 - 10.5 mg/dL Final  . Total Protein 01/22/2014 7.5  6.0 - 8.3 g/dL Final  . Albumin 01/22/2014 3.8  3.5 - 5.2 g/dL Final  . AST 01/22/2014 15  0 - 37 U/L Final  . ALT 01/22/2014 13  0 - 53 U/L Final  . Alkaline Phosphatase 01/22/2014 49  39 - 117 U/L Final  . Total Bilirubin 01/22/2014 0.4  0.3 - 1.2 mg/dL Final  . GFR calc non Af Amer 01/22/2014 82* >90 mL/min Final  . GFR calc Af Amer 01/22/2014  >90  >90 mL/min Final   Comment: (NOTE) The eGFR has been calculated using the CKD EPI equation. This calculation has not been validated in all clinical situations. eGFR's persistently <90 mL/min signify possible Chronic Kidney Disease.   . Anion gap 01/22/2014 7  5 - 15 Final  .  Prothrombin Time 01/22/2014 13.6  11.6 - 15.2 seconds Final  . INR 01/22/2014 1.03  0.00 - 1.49 Final  . ABO/RH(D) 01/22/2014 A POS   Final  . Antibody Screen 01/22/2014 NEG   Final  . Sample Expiration 01/22/2014 01/25/2014   Final  . ABO/RH(D) 01/22/2014 A POS   Final  . Sodium 01/22/2014 139  135 - 145 mmol/L Final  . Potassium 01/22/2014 3.8  3.5 - 5.1 mmol/L Final  . Chloride 01/22/2014 101  96 - 112 mEq/L Final  . BUN 01/22/2014 13  6 - 23 mg/dL Final  . Creatinine, Ser 01/22/2014 0.90  0.50 - 1.35 mg/dL Final  . Glucose, Bld 01/22/2014 112* 70 - 99 mg/dL Final  . Calcium, Ion 01/22/2014 1.17  1.13 - 1.30 mmol/L Final  . TCO2 01/22/2014 23  0 - 100 mmol/L Final  . Hemoglobin 01/22/2014 14.3  13.0 - 17.0 g/dL Final  . HCT 01/22/2014 42.0  39.0 - 52.0 % Final  . WBC 01/23/2014 4.9  4.0 - 10.5 K/uL Final   WHITE COUNT CONFIRMED ON SMEAR  . RBC 01/23/2014 3.72* 4.22 - 5.81 MIL/uL Final  . Hemoglobin 01/23/2014 11.8* 13.0 - 17.0 g/dL Final  . HCT 01/23/2014 34.1* 39.0 - 52.0 % Final  . MCV 01/23/2014 91.7  78.0 - 100.0 fL Final  . MCH 01/23/2014 31.7  26.0 - 34.0 pg Final  . MCHC 01/23/2014 34.6  30.0 - 36.0 g/dL Final  . RDW 01/23/2014 14.2  11.5 - 15.5 % Final  . Platelets 01/23/2014 PLATELET CLUMPS NOTED ON SMEAR, UNABLE TO ESTIMATE  150 - 400 K/uL Final  . Sodium 01/23/2014 133* 135 - 145 mmol/L Final   Please note change in reference range.  . Potassium 01/23/2014 3.8  3.5 - 5.1 mmol/L Final   Please note change in reference range.  . Chloride 01/23/2014 101  96 - 112 mEq/L Final  . CO2 01/23/2014 26  19 - 32 mmol/L Final  . Glucose, Bld 01/23/2014 127* 70 - 99 mg/dL Final  . BUN 01/23/2014 11   6 - 23 mg/dL Final  . Creatinine, Ser 01/23/2014 0.85  0.50 - 1.35 mg/dL Final  . Calcium 01/23/2014 8.4  8.4 - 10.5 mg/dL Final  . GFR calc non Af Amer 01/23/2014 84* >90 mL/min Final  . GFR calc Af Amer 01/23/2014 >90  >90 mL/min Final   Comment: (NOTE) The eGFR has been calculated using the CKD EPI equation. This calculation has not been validated in all clinical situations. eGFR's persistently <90 mL/min signify possible Chronic Kidney Disease.   . Anion gap 01/23/2014 6  5 - 15 Final  . WBC 01/22/2014 5.5  4.0 - 10.5 K/uL Final  . RBC 01/22/2014 3.83* 4.22 - 5.81 MIL/uL Final  . Hemoglobin 01/22/2014 12.0* 13.0 - 17.0 g/dL Final  . HCT 01/22/2014 35.4* 39.0 - 52.0 % Final  . MCV 01/22/2014 92.4  78.0 - 100.0 fL Final  . MCH 01/22/2014 31.3  26.0 - 34.0 pg Final  . MCHC 01/22/2014 33.9  30.0 - 36.0 g/dL Final  . RDW 01/22/2014 14.2  11.5 - 15.5 % Final  . Platelets 01/22/2014 PLATELET CLUMPS NOTED ON SMEAR, UNABLE TO ESTIMATE  150 - 400 K/uL Final  . Creatinine, Ser 01/22/2014 0.94  0.50 - 1.35 mg/dL Final  . GFR calc non Af Amer 01/22/2014 80* >90 mL/min Final  . GFR calc Af Amer 01/22/2014 >90  >90 mL/min Final   Comment: (NOTE) The eGFR has been calculated  using the CKD EPI equation. This calculation has not been validated in all clinical situations. eGFR's persistently <90 mL/min signify possible Chronic Kidney Disease.   . WBC 01/24/2014 5.6  4.0 - 10.5 K/uL Final   WHITE COUNT CONFIRMED ON SMEAR  . RBC 01/24/2014 3.31* 4.22 - 5.81 MIL/uL Final  . Hemoglobin 01/24/2014 10.2* 13.0 - 17.0 g/dL Final  . HCT 01/24/2014 30.5* 39.0 - 52.0 % Final  . MCV 01/24/2014 92.1  78.0 - 100.0 fL Final  . MCH 01/24/2014 30.8  26.0 - 34.0 pg Final  . MCHC 01/24/2014 33.4  30.0 - 36.0 g/dL Final  . RDW 01/24/2014 14.3  11.5 - 15.5 % Final  . Platelets 01/24/2014 PLATELET CLUMPS NOTED ON SMEAR, COUNT APPEARS DECREASED  150 - 400 K/uL Final  . Sodium 01/24/2014 134* 135 - 145 mmol/L Final    Please note change in reference range.  . Potassium 01/24/2014 3.8  3.5 - 5.1 mmol/L Final   Please note change in reference range.  . Chloride 01/24/2014 101  96 - 112 mEq/L Final  . CO2 01/24/2014 28  19 - 32 mmol/L Final  . Glucose, Bld 01/24/2014 124* 70 - 99 mg/dL Final  . BUN 01/24/2014 12  6 - 23 mg/dL Final  . Creatinine, Ser 01/24/2014 1.02  0.50 - 1.35 mg/dL Final  . Calcium 01/24/2014 8.1* 8.4 - 10.5 mg/dL Final  . GFR calc non Af Amer 01/24/2014 70* >90 mL/min Final  . GFR calc Af Amer 01/24/2014 82* >90 mL/min Final   Comment: (NOTE) The eGFR has been calculated using the CKD EPI equation. This calculation has not been validated in all clinical situations. eGFR's persistently <90 mL/min signify possible Chronic Kidney Disease.   . Anion gap 01/24/2014 5  5 - 15 Final  . WBC 01/25/2014 5.1  4.0 - 10.5 K/uL Final  . RBC 01/25/2014 3.03* 4.22 - 5.81 MIL/uL Final  . Hemoglobin 01/25/2014 9.5* 13.0 - 17.0 g/dL Final  . HCT 01/25/2014 28.1* 39.0 - 52.0 % Final  . MCV 01/25/2014 92.7  78.0 - 100.0 fL Final  . MCH 01/25/2014 31.4  26.0 - 34.0 pg Final  . MCHC 01/25/2014 33.8  30.0 - 36.0 g/dL Final  . RDW 01/25/2014 14.4  11.5 - 15.5 % Final  . Platelets 01/25/2014 PLATELET CLUMPS NOTED ON SMEAR, COUNT APPEARS DECREASED  150 - 400 K/uL Final   PLATELET CLUMPING, SUGGEST RECOLLECTION OF SAMPLE IN CITRATE TUBE.  . Sodium 01/25/2014 135  135 - 145 mmol/L Final   Please note change in reference range.  . Potassium 01/25/2014 3.6  3.5 - 5.1 mmol/L Final   Please note change in reference range.  . Chloride 01/25/2014 102  96 - 112 mEq/L Final  . CO2 01/25/2014 27  19 - 32 mmol/L Final  . Glucose, Bld 01/25/2014 133* 70 - 99 mg/dL Final  . BUN 01/25/2014 8  6 - 23 mg/dL Final  . Creatinine, Ser 01/25/2014 0.86  0.50 - 1.35 mg/dL Final  . Calcium 01/25/2014 8.1* 8.4 - 10.5 mg/dL Final  . GFR calc non Af Amer 01/25/2014 83* >90 mL/min Final  . GFR calc Af Amer 01/25/2014 >90   >90 mL/min Final   Comment: (NOTE) The eGFR has been calculated using the CKD EPI equation. This calculation has not been validated in all clinical situations. eGFR's persistently <90 mL/min signify possible Chronic Kidney Disease.   . Anion gap 01/25/2014 6  5 - 15 Final  . WBC 01/25/2014 5.4  4.0 - 10.5 K/uL Final  . RBC 01/25/2014 3.26* 4.22 - 5.81 MIL/uL Final  . Hemoglobin 01/25/2014 9.9* 13.0 - 17.0 g/dL Final  . HCT 01/25/2014 29.4* 39.0 - 52.0 % Final  . MCV 01/25/2014 90.2  78.0 - 100.0 fL Final  . MCH 01/25/2014 30.4  26.0 - 34.0 pg Final  . MCHC 01/25/2014 33.7  30.0 - 36.0 g/dL Final  . RDW 01/25/2014 14.3  11.5 - 15.5 % Final  . Platelets 01/25/2014 106* 150 - 400 K/uL Final   Comment: PLATELET COUNT PERFORMED ON CITRATED BLOOD PLATELET COUNT CONFIRMED BY SMEAR   . Hgb A1c MFr Bld 01/25/2014 6.0* <5.7 % Final   Comment: (NOTE)                                                                       According to the ADA Clinical Practice Recommendations for 2011, when HbA1c is used as a screening test:  >=6.5%   Diagnostic of Diabetes Mellitus           (if abnormal result is confirmed) 5.7-6.4%   Increased risk of developing Diabetes Mellitus References:Diagnosis and Classification of Diabetes Mellitus,Diabetes VFIE,3329,51(OACZY 1):S62-S69 and Standards of Medical Care in         Diabetes - 2011,Diabetes SAYT,0160,10 (Suppl 1):S11-S61.   . Mean Plasma Glucose 01/25/2014 126* <117 mg/dL Final   Performed at [facility]  . Sodium 01/26/2014 135  135 - 145 mmol/L Final   Please note change in reference range.  . Potassium 01/26/2014 3.9  3.5 - 5.1 mmol/L Final   Please note change in reference range.  . Chloride 01/26/2014 101  96 - 112 mEq/L Final  . CO2 01/26/2014 28  19 - 32 mmol/L Final  . Glucose, Bld 01/26/2014 131* 70 - 99 mg/dL Final  . BUN 01/26/2014 6  6 - 23 mg/dL Final  . Creatinine, Ser 01/26/2014 0.81  0.50 - 1.35 mg/dL Final  . Calcium  01/26/2014 8.6  8.4 - 10.5 mg/dL Final  . GFR calc non Af Amer 01/26/2014 85* >90 mL/min Final  . GFR calc Af Amer 01/26/2014 >90  >90 mL/min Final   Comment: (NOTE) The eGFR has been calculated using the CKD EPI equation. This calculation has not been validated in all clinical situations. eGFR's persistently <90 mL/min signify possible Chronic Kidney Disease.   . Anion gap 01/26/2014 6  5 - 15 Final  Admission on 11/10/2013, Discharged on 11/11/2013  Component Date Value Ref Range Status  . Sodium 11/10/2013 135* 137 - 147 mEq/L Final  . Potassium 11/10/2013 4.2  3.7 - 5.3 mEq/L Final  . Chloride 11/10/2013 98  96 - 112 mEq/L Final  . CO2 11/10/2013 23  19 - 32 mEq/L Final  . Glucose, Bld 11/10/2013 116* 70 - 99 mg/dL Final  . BUN 11/10/2013 18  6 - 23 mg/dL Final  . Creatinine, Ser 11/10/2013 0.93  0.50 - 1.35 mg/dL Final  . Calcium 11/10/2013 9.1  8.4 - 10.5 mg/dL Final  . GFR calc non Af Amer 11/10/2013 81* >90 mL/min Final  . GFR calc Af Amer 11/10/2013 >90  >90 mL/min Final   Comment: (NOTE)  The eGFR has been calculated using the CKD EPI equation.                          This calculation has not been validated in all clinical situations.                          eGFR's persistently <90 mL/min signify possible Chronic Kidney                          Disease.  . Anion gap 11/10/2013 14  5 - 15 Final  . WBC 11/10/2013 4.4  4.0 - 10.5 K/uL Final  . RBC 11/10/2013 4.47  4.22 - 5.81 MIL/uL Final  . Hemoglobin 11/10/2013 14.2  13.0 - 17.0 g/dL Final  . HCT 11/10/2013 40.7  39.0 - 52.0 % Final  . MCV 11/10/2013 91.1  78.0 - 100.0 fL Final  . MCH 11/10/2013 31.8  26.0 - 34.0 pg Final  . MCHC 11/10/2013 34.9  30.0 - 36.0 g/dL Final  . RDW 11/10/2013 13.1  11.5 - 15.5 % Final  . Platelets 11/10/2013 PLATELET CLUMPS NOTED ON SMEAR, COUNT APPEARS DECREASED  150 - 400 K/uL Final  . Neutrophils Relative % 11/10/2013 55  43 - 77 % Final  . Lymphocytes Relative  11/10/2013 21  12 - 46 % Final  . Monocytes Relative 11/10/2013 20* 3 - 12 % Final  . Eosinophils Relative 11/10/2013 3  0 - 5 % Final  . Basophils Relative 11/10/2013 1  0 - 1 % Final  . Neutro Abs 11/10/2013 2.5  1.7 - 7.7 K/uL Final  . Lymphs Abs 11/10/2013 0.9  0.7 - 4.0 K/uL Final  . Monocytes Absolute 11/10/2013 0.9  0.1 - 1.0 K/uL Final  . Eosinophils Absolute 11/10/2013 0.1  0.0 - 0.7 K/uL Final  . Basophils Absolute 11/10/2013 0.0  0.0 - 0.1 K/uL Final  . WBC Morphology 11/10/2013 ATYPICAL LYMPHOCYTES   Final  . Color, Urine 11/10/2013 RED* YELLOW Final   BIOCHEMICALS MAY BE AFFECTED BY COLOR  . APPearance 11/10/2013 TURBID* CLEAR Final  . Specific Gravity, Urine 11/10/2013 1.042* 1.005 - 1.030 Final  . pH 11/10/2013 5.5  5.0 - 8.0 Final  . Glucose, UA 11/10/2013 NEGATIVE  NEGATIVE mg/dL Final  . Hgb urine dipstick 11/10/2013 LARGE* NEGATIVE Final  . Bilirubin Urine 11/10/2013 MODERATE* NEGATIVE Final  . Ketones, ur 11/10/2013 NEGATIVE  NEGATIVE mg/dL Final  . Protein, ur 11/10/2013 100* NEGATIVE mg/dL Final  . Urobilinogen, UA 11/10/2013 1.0  0.0 - 1.0 mg/dL Final  . Nitrite 11/10/2013 NEGATIVE  NEGATIVE Final  . Leukocytes, UA 11/10/2013 SMALL* NEGATIVE Final  . Squamous Epithelial / LPF 11/10/2013 RARE  RARE Final  . WBC, UA 11/10/2013 11-20  <3 WBC/hpf Final  . RBC / HPF 11/10/2013 TOO NUMEROUS TO COUNT  <3 RBC/hpf Final  . Bacteria, UA 11/10/2013 MANY* RARE Final  . Urine-Other 11/10/2013 MUCOUS PRESENT   Final    Dg Chest 1 View  01/22/2014   CLINICAL DATA:  Fall, left hip pain  EXAM: CHEST - 1 VIEW  COMPARISON:  CT chest dated 07/17/2008  FINDINGS: Lungs are essentially clear. Biapical pleural parenchymal scarring. No focal consolidation. No pleural effusion or pneumothorax.  The heart is normal in size.  Degenerative changes of the visualized thoracolumbar spine.  IMPRESSION: No evidence of acute cardiopulmonary disease.   Electronically Signed  By: Julian Hy  M.D.   On: 01/22/2014 12:34   Dg Hip Complete Left  01/22/2014   CLINICAL DATA:  Fall  EXAM: LEFT HIP - COMPLETE 2+ VIEW  COMPARISON:  None.  FINDINGS: Comminuted left intertrochanteric femur fracture with some deformity is present. Lesser trochanter is somewhat displaced. Vascular calcifications are noted. Osteopenia. Degenerative changes in the lumbar spine and right hip joint are noted.  IMPRESSION: Comminuted left intertrochanteric femur fracture.   Electronically Signed   By: Maryclare Bean M.D.   On: 01/22/2014 12:38   Dg Hip Operative Left  01/22/2014   CLINICAL DATA:  Acute left hip intertrochanteric fracture, operative fixation  EXAM: OPERATIVE LEFT HIP  COMPARISON:  01/22/2014  FINDINGS: Proximal left femur intra medullary rod and screw fixation reduce the intertrochanteric fracture with improved alignment. Fracture lines remain visible. Small displaced lesser trochanter fragment again noted. No complicating feature.  IMPRESSION: Status post ORIF for the acute left hip intertrochanteric fracture with improved alignment.   Electronically Signed   By: Daryll Brod M.D.   On: 01/22/2014 17:46   Pelvis Portable  01/23/2014   CLINICAL DATA:  Operative reduction and internal fixation of femur fracture.  EXAM: PORTABLE PELVIS 1-2 VIEWS  COMPARISON:  Multiple exams, including 01/22/2014  FINDINGS: Fixation of the intertrochanteric fracture with a trochanteric nail and lag screw apparatus, successfully bridging the fracture and with expected orientation and appearance. No new fracture. Avulsed lesser trochanter noted.  Lower lumbar spondylosis. Mild to moderate degenerative arthropathy of both hips.  IMPRESSION: 1. Intramedullary nail placement for left hip trochanteric fracture, without complicating feature observed.   Electronically Signed   By: Sherryl Barters M.D.   On: 01/23/2014 08:34     Assessment/Plan    ICD-9-CM ICD-10-CM   1. Acute urinary retention 788.29 R33.8   2. BPH (benign  prostatic hypertrophy) 600.00 N40.0   3. Hip fracture requiring operative repair, unspecified laterality, sequela 905.3 S72.009S     - change colace to $RemoveB'200mg'[id number]$  daily. D/w pt and nursing staff  - f/u with urology and Ortho as scheduled  - PT/OT as ordered  - continue current pain regimen. lovenox for DVT prophylaxis  GOAL: short term rehab and d/c to home when appropriate  [facility] S. Perlie Gold  [facility] [address] [place], [place] [address] [phone] Office (Wednesdays and Fridays 8 AM - 5 PM) [phone] Cell (Monday-Friday 8 AM - 5 PM)

## 2014-02-09 DIAGNOSIS — R350 Frequency of micturition: Secondary | ICD-10-CM | POA: Diagnosis not present

## 2014-02-09 DIAGNOSIS — R351 Nocturia: Secondary | ICD-10-CM | POA: Diagnosis not present

## 2014-02-09 DIAGNOSIS — R338 Other retention of urine: Secondary | ICD-10-CM | POA: Diagnosis not present

## 2014-02-25 ENCOUNTER — Non-Acute Institutional Stay (SKILLED_NURSING_FACILITY): Payer: 59 | Admitting: Adult Health

## 2014-02-25 DIAGNOSIS — K5900 Constipation, unspecified: Secondary | ICD-10-CM

## 2014-02-25 DIAGNOSIS — S72002S Fracture of unspecified part of neck of left femur, sequela: Secondary | ICD-10-CM

## 2014-02-25 DIAGNOSIS — N400 Enlarged prostate without lower urinary tract symptoms: Secondary | ICD-10-CM

## 2014-02-28 ENCOUNTER — Inpatient Hospital Stay (HOSPITAL_COMMUNITY)
Admission: EM | Admit: 2014-02-28 | Discharge: 2014-03-03 | DRG: 176 | Disposition: A | Discharge status: Skilled Nursing Facility | Payer: 59 | Attending: Internal Medicine | Admitting: Internal Medicine

## 2014-02-28 ENCOUNTER — Emergency Department (HOSPITAL_COMMUNITY): Payer: 59

## 2014-02-28 ENCOUNTER — Encounter (HOSPITAL_COMMUNITY): Payer: Self-pay

## 2014-02-28 DIAGNOSIS — Z789 Other specified health status: Secondary | ICD-10-CM

## 2014-02-28 DIAGNOSIS — T671XXD Heat syncope, subsequent encounter: Secondary | ICD-10-CM

## 2014-02-28 DIAGNOSIS — I82409 Acute embolism and thrombosis of unspecified deep veins of unspecified lower extremity: Secondary | ICD-10-CM | POA: Diagnosis not present

## 2014-02-28 DIAGNOSIS — R001 Bradycardia, unspecified: Secondary | ICD-10-CM | POA: Diagnosis present

## 2014-02-28 DIAGNOSIS — I951 Orthostatic hypotension: Secondary | ICD-10-CM | POA: Diagnosis present

## 2014-02-28 DIAGNOSIS — Z09 Encounter for follow-up examination after completed treatment for conditions other than malignant neoplasm: Secondary | ICD-10-CM

## 2014-02-28 DIAGNOSIS — S72142A Displaced intertrochanteric fracture of left femur, initial encounter for closed fracture: Secondary | ICD-10-CM | POA: Diagnosis not present

## 2014-02-28 DIAGNOSIS — I700 Atherosclerosis of aorta: Secondary | ICD-10-CM | POA: Diagnosis not present

## 2014-02-28 DIAGNOSIS — F17210 Nicotine dependence, cigarettes, uncomplicated: Secondary | ICD-10-CM | POA: Diagnosis present

## 2014-02-28 DIAGNOSIS — R531 Weakness: Secondary | ICD-10-CM | POA: Diagnosis not present

## 2014-02-28 DIAGNOSIS — I2699 Other pulmonary embolism without acute cor pulmonale: Secondary | ICD-10-CM | POA: Diagnosis not present

## 2014-02-28 DIAGNOSIS — N400 Enlarged prostate without lower urinary tract symptoms: Secondary | ICD-10-CM | POA: Diagnosis not present

## 2014-02-28 DIAGNOSIS — S72146A Nondisplaced intertrochanteric fracture of unspecified femur, initial encounter for closed fracture: Secondary | ICD-10-CM | POA: Diagnosis not present

## 2014-02-28 DIAGNOSIS — R55 Syncope and collapse: Secondary | ICD-10-CM | POA: Diagnosis not present

## 2014-02-28 DIAGNOSIS — I2510 Atherosclerotic heart disease of native coronary artery without angina pectoris: Secondary | ICD-10-CM | POA: Diagnosis not present

## 2014-02-28 DIAGNOSIS — R339 Retention of urine, unspecified: Secondary | ICD-10-CM | POA: Diagnosis not present

## 2014-02-28 DIAGNOSIS — N401 Enlarged prostate with lower urinary tract symptoms: Secondary | ICD-10-CM | POA: Diagnosis not present

## 2014-02-28 DIAGNOSIS — I82432 Acute embolism and thrombosis of left popliteal vein: Secondary | ICD-10-CM | POA: Diagnosis present

## 2014-02-28 DIAGNOSIS — R404 Transient alteration of awareness: Secondary | ICD-10-CM | POA: Diagnosis not present

## 2014-02-28 DIAGNOSIS — D696 Thrombocytopenia, unspecified: Secondary | ICD-10-CM | POA: Diagnosis not present

## 2014-02-28 DIAGNOSIS — R791 Abnormal coagulation profile: Secondary | ICD-10-CM | POA: Diagnosis not present

## 2014-02-28 HISTORY — DX: Unspecified thoracic, thoracolumbar and lumbosacral intervertebral disc disorder: M51.9

## 2014-02-28 HISTORY — DX: Benign prostatic hyperplasia without lower urinary tract symptoms: N40.0

## 2014-02-28 HISTORY — DX: Unspecified osteoarthritis, unspecified site: M19.90

## 2014-02-28 LAB — CBC WITH DIFFERENTIAL/PLATELET
BASOS PCT: 0 % (ref 0–1)
Basophils Absolute: 0 10*3/uL (ref 0.0–0.1)
EOS PCT: 3 % (ref 0–5)
Eosinophils Absolute: 0.2 10*3/uL (ref 0.0–0.7)
HEMATOCRIT: 35.9 % — AB (ref 39.0–52.0)
Hemoglobin: 12 g/dL — ABNORMAL LOW (ref 13.0–17.0)
LYMPHS PCT: 23 % (ref 12–46)
Lymphs Abs: 1.3 10*3/uL (ref 0.7–4.0)
MCH: 30.6 pg (ref 26.0–34.0)
MCHC: 33.4 g/dL (ref 30.0–36.0)
MCV: 91.6 fL (ref 78.0–100.0)
MONOS PCT: 9 % (ref 3–12)
Monocytes Absolute: 0.5 10*3/uL (ref 0.1–1.0)
NEUTROS ABS: 3.8 10*3/uL (ref 1.7–7.7)
Neutrophils Relative %: 65 % (ref 43–77)
RBC: 3.92 MIL/uL — ABNORMAL LOW (ref 4.22–5.81)
RDW: 14.4 % (ref 11.5–15.5)
WBC: 5.8 10*3/uL (ref 4.0–10.5)

## 2014-02-28 LAB — TROPONIN I

## 2014-02-28 LAB — CBG MONITORING, ED: Glucose-Capillary: 124 mg/dL — ABNORMAL HIGH (ref 70–99)

## 2014-02-28 LAB — CBC
HCT: 34.3 % — ABNORMAL LOW (ref 39.0–52.0)
Hemoglobin: 11.6 g/dL — ABNORMAL LOW (ref 13.0–17.0)
MCH: 30.7 pg (ref 26.0–34.0)
MCHC: 33.8 g/dL (ref 30.0–36.0)
MCV: 90.7 fL (ref 78.0–100.0)
PLATELETS: 110 10*3/uL — AB (ref 150–400)
RBC: 3.78 MIL/uL — ABNORMAL LOW (ref 4.22–5.81)
RDW: 14.3 % (ref 11.5–15.5)
WBC: 6.1 10*3/uL (ref 4.0–10.5)

## 2014-02-28 LAB — URINALYSIS, ROUTINE W REFLEX MICROSCOPIC
Bilirubin Urine: NEGATIVE
Glucose, UA: NEGATIVE mg/dL
HGB URINE DIPSTICK: NEGATIVE
Ketones, ur: NEGATIVE mg/dL
Leukocytes, UA: NEGATIVE
Nitrite: NEGATIVE
PH: 7.5 (ref 5.0–8.0)
Protein, ur: NEGATIVE mg/dL
SPECIFIC GRAVITY, URINE: 1.014 (ref 1.005–1.030)
Urobilinogen, UA: 0.2 mg/dL (ref 0.0–1.0)

## 2014-02-28 LAB — TSH: TSH: 1.127 u[IU]/mL (ref 0.350–4.500)

## 2014-02-28 LAB — BASIC METABOLIC PANEL
Anion gap: 6 (ref 5–15)
BUN: 13 mg/dL (ref 6–23)
CHLORIDE: 104 mmol/L (ref 96–112)
CO2: 27 mmol/L (ref 19–32)
CREATININE: 0.93 mg/dL (ref 0.50–1.35)
Calcium: 8.7 mg/dL (ref 8.4–10.5)
GFR calc Af Amer: >90 mL/min (ref >90)
GFR, EST NON AFRICAN AMERICAN: 81 mL/min — AB (ref >90)
Glucose, Bld: 141 mg/dL — ABNORMAL HIGH (ref 70–99)
Potassium: 3.9 mmol/L (ref 3.5–5.1)
Sodium: 137 mmol/L (ref 135–145)

## 2014-02-28 LAB — D-DIMER, QUANTITATIVE: D-Dimer, Quant: 1.93 ug/mL-FEU — ABNORMAL HIGH (ref 0.00–0.48)

## 2014-02-28 LAB — CREATININE, SERUM
Creatinine, Ser: 0.79 mg/dL (ref 0.50–1.35)
GFR calc non Af Amer: 86 mL/min — ABNORMAL LOW (ref >90)

## 2014-02-28 MED ORDER — DOCUSATE SODIUM 100 MG PO CAPS
100.0000 mg | ORAL_CAPSULE | Freq: Two times a day (BID) | ORAL | Status: DC
  Administered 2014-02-28 – 2014-03-03 (×6): 100 mg via ORAL
  Filled 2014-02-28 (×6): qty 1

## 2014-02-28 MED ORDER — SODIUM CHLORIDE 0.9 % IV BOLUS (SEPSIS)
1000.0000 mL | Freq: Once | INTRAVENOUS | Status: AC
  Administered 2014-02-28: 1000 mL via INTRAVENOUS

## 2014-02-28 MED ORDER — OXYCODONE HCL 5 MG PO TABS: 5.0000 mg | ORAL_TABLET | ORAL | Status: DC | PRN

## 2014-02-28 MED ORDER — LORAZEPAM 2 MG/ML IJ SOLN: 1.0000 mg | Freq: Once | INTRAMUSCULAR | Status: AC | PRN

## 2014-02-28 MED ORDER — SODIUM CHLORIDE 0.9 % IJ SOLN
3.0000 mL | Freq: Two times a day (BID) | INTRAMUSCULAR | Status: DC
  Administered 2014-02-28 – 2014-03-02 (×3): 3 mL via INTRAVENOUS

## 2014-02-28 MED ORDER — ACETAMINOPHEN 325 MG PO TABS: 650.0000 mg | ORAL_TABLET | Freq: Four times a day (QID) | ORAL | Status: DC | PRN

## 2014-02-28 MED ORDER — ENOXAPARIN SODIUM 40 MG/0.4ML ~~LOC~~ SOLN
40.0000 mg | SUBCUTANEOUS | Status: DC
  Administered 2014-02-28: 40 mg via SUBCUTANEOUS
  Filled 2014-02-28: qty 0.4

## 2014-02-28 MED ORDER — ONDANSETRON HCL 4 MG PO TABS: 4.0000 mg | ORAL_TABLET | Freq: Three times a day (TID) | ORAL | Status: DC | PRN

## 2014-02-28 MED ORDER — ENOXAPARIN SODIUM 40 MG/0.4ML ~~LOC~~ SOLN: 40.0000 mg | SUBCUTANEOUS | Status: DC

## 2014-02-28 NOTE — ED Provider Notes (Signed)
CSN: [id number]     Arrival date & time 02/28/14  1028 History   First MD Initiated Contact with Patient 02/28/14 1033     Chief Complaint  Patient presents with  . Loss of Consciousness     (Consider location/radiation/quality/duration/timing/severity/associated sxs/prior Treatment) Patient is a 74 y.o. male presenting with syncope. The history is provided by the patient.  Loss of Consciousness Episode history:  Multiple Most recent episode:  Today Duration:  4 minutes Timing:  Intermittent Progression:  Unchanged Chronicity:  New Context comment:  Recent hip surgery Witnessed: no   Relieved by:  Nothing Worsened by:  Nothing tried Ineffective treatments:  None tried Associated symptoms: dizziness   Associated symptoms: no chest pain, no fever, no shortness of breath and no vomiting   Risk factors: no congenital heart disease, no coronary artery disease, no seizures and no vascular disease     History reviewed. No pertinent past medical history. Past Surgical History  Procedure Laterality Date  . Back surgery    . Intramedullary (im) nail intertrochanteric Left 01/22/2014    Procedure: INTRAMEDULLARY (IM) NAIL INTERTROCHANTRIC;  Surgeon: Melina Schools, MD;  Location: [place];  Service: Orthopedics;  Laterality: Left;   No family history on file. History  Substance Use Topics  . Smoking status: Current Every Day Smoker -- 1.50 packs/day    Types: Cigarettes  . Smokeless tobacco: Not on file  . Alcohol Use: No    Review of Systems  Constitutional: Negative for fever.  Respiratory: Negative for cough and shortness of breath.   Cardiovascular: Positive for syncope. Negative for chest pain.  Gastrointestinal: Negative for vomiting and abdominal pain.  Neurological: Positive for dizziness.  All other systems reviewed and are negative.     Allergies  Review of patient's allergies indicates no known allergies.  Home Medications   Prior to Admission medications    Medication Sig Start Date End Date Taking? Authorizing Provider  enoxaparin (LOVENOX) 40 MG/0.4ML injection Inject 0.4 mLs (40 mg total) into the skin daily. 01/24/14   Amber Renelda Loma, PA-C  oxyCODONE (OXY IR/ROXICODONE) 5 MG immediate release tablet Take 1-2 tablets (5-10 mg total) by mouth every 4 (four) hours as needed for breakthrough pain ((for MODERATE breakthrough pain)). 01/23/14   Amber Renelda Loma, PA-C  tamsulosin (FLOMAX) 0.4 MG CAPS capsule Take 2 capsules (0.8 mg total) by mouth daily. 01/25/14   Verlee Monte, MD   SpO2 99% Physical Exam  Constitutional: He is oriented to person, place, and time. He appears well-developed and well-nourished. No distress.  HENT:  Head: Normocephalic and atraumatic.  Mouth/Throat: Oropharynx is clear and moist. No oropharyngeal exudate.  Eyes: EOM are normal. Pupils are equal, round, and reactive to light.  Neck: Normal range of motion. Neck supple.  Cardiovascular: Normal rate and regular rhythm.  Exam reveals no friction rub.   No murmur heard. Pulmonary/Chest: Effort normal and breath sounds normal. No respiratory distress. He has no wheezes. He has no rales.  Abdominal: Soft. He exhibits no distension. There is no tenderness. There is no rebound.  Musculoskeletal: Normal range of motion. He exhibits no edema.  Neurological: He is alert and oriented to person, place, and time. No cranial nerve deficit. He exhibits normal muscle tone. Coordination normal.  Skin: Skin is warm. No rash noted. He is not diaphoretic.  Nursing note and vitals reviewed.   ED Course  Procedures (including critical care time) Labs Review Labs Reviewed  CBC WITH DIFFERENTIAL/PLATELET  BASIC METABOLIC PANEL  TROPONIN  I    Imaging Review No results found.   EKG Interpretation   Date/Time:  Saturday February 28 2014 10:32:50 EST Ventricular Rate:  78 PR Interval:  181 QRS Duration: 107 QT Interval:  381 QTC Calculation: 434 R Axis:    87 Text Interpretation:  Sinus rhythm Borderline right axis deviation  Abnormal R-wave progression, early transition No significant change since  last tracing Confirmed by [name]  MD, [place] ([id number]) on 02/28/2014 10:45:16  AM      MDM   Final diagnoses:  Syncope    23M here with syncope. 2 episodes this morning, one captured by EMS had some PVCs with subsequent bradycardia in the 40s. Patient had some accompanying dizziness describe as lightheadedness. AFVSS here. Neuro exam ok. Just taken off blood thinners since he's ambulatory after a hip surgery. He's currently residing in [place], [place] a broken hip. EKG ok. Rhythm strips reviewed from EMS showed some PVCs with subsequent bradycardia in the 40s-50s, but no rates in the 20s-30s.   [name] Bucy, MD 02/28/14 1046

## 2014-02-28 NOTE — Consult Note (Addendum)
Cardiology Consult Note  Admit date: 02/28/2014 Name: [name] [name] 74 y.o.  male DOB:  [birth year] MRN:  [id number]  Today's date:  02/28/2014  Referring Physician:    [facility]  Primary Physician:  Dr. Nancy Fetter  Reason for Consultation:    Syncope   IMPRESSIONS: 1.  Transient episodes of syncope accompanied with witnessed bradycardia could've been vasovagal.  A big consideration in light of a recent and current rehabilitation stay, and recent hip surgery would be pulmonary emboli.  The other consideration could be orthostasis due to a very high dose of Flomax. 2.  Recent hip surgery. 3.  BPH with recent bladder retention. 4.  Atherosclerotic calcifications noted on chest x-ray 5.  Tobacco abuse  RECOMMENDATION: 1.  2-D echocardiogram 2.  D-dimer and lower extremity Dopplers to be sure he does not have DVT. 3.  Consider VQ scan or CTA to look for pulmonary emboli.  If any of the above are positive. 4.  Telemetry monitoring. 5.  Orthostatic vital signs  HISTORY: This 74 year old male is normally healthy without significant medical problems.  He smokes heavily.  He fell down the stairs and had a hip fracture in December.  He was discharged on December 28 to [facility].  He has been in rehabilitation there, except was allowed to go home for the day.  The weekend prior to when it snowed.  He had been a [place] living and was again giving a day pass to go home today.  He went home and was sitting at his desk paying some bills when he felt somewhat hot, sweaty, and had a syncopal episode.  His wife called EMS and he was noted to be somewhat bradycardic.  He thinks he might of been out about 3 minutes.  Evidently on the way and in the ambulance.  He was noted to have bradycardia and had some transient dizziness since then.  He feels fine at the present time.  He denies angina and has no PND, orthopnea or edema.  He has had some mild tenderness in his left leg.  He has also has a prior  history of some issues with his knee.  He does have atherosclerotic calcifications on his chest x-ray and does smoke but is only smoked about a pack of cigarettes since he has been in rehabilitation.  No clinical history of heart disease or angina.  Formerly drove a tour bus.  He has been on a fairly high dose of Flomax.    Of note, he is still at [facility] but was just given a day pass today was to return there this evening.  He was supposed to go home.  Tuesday, after seeing the orthopedic surgeon. Past Medical History  Diagnosis Date  . BPH (benign prostatic hypertrophy)   . Osteoarthritis   . Lumbar disc disease       Past Surgical History  Procedure Laterality Date  . Lumbar laminectomy    . Intramedullary (im) nail intertrochanteric Left 01/22/2014    Procedure: INTRAMEDULLARY (IM) NAIL INTERTROCHANTRIC;  Surgeon: Melina Schools, MD;  Location: [place];  Service: Orthopedics;  Laterality: Left;    Allergies:  has No Known Allergies.   Medications: Prior to Admission medications   Medication Sig Start Date End Date Taking? Authorizing Provider  aspirin 325 MG tablet Take 325 mg by mouth daily.   Yes Historical Provider, MD  docusate sodium (COLACE) 100 MG capsule Take 100 mg by mouth daily.   Yes Historical Provider, MD  enoxaparin (LOVENOX) 40 MG/0.4ML injection Inject 0.4 mLs (40 mg total) into the skin daily. 01/24/14  Yes Amber Renelda Loma, PA-C  oxyCODONE (OXY IR/ROXICODONE) 5 MG immediate release tablet Take 1-2 tablets (5-10 mg total) by mouth every 4 (four) hours as needed for breakthrough pain ((for MODERATE breakthrough pain)). 01/23/14  Yes Amber Renelda Loma, PA-C  tamsulosin (FLOMAX) 0.4 MG CAPS capsule Take 2 capsules (0.8 mg total) by mouth daily. 01/25/14  Yes Verlee Monte, MD    Family History: No family status information on file.    Social History:   reports that he has been smoking Cigarettes.  He has been smoking about 1.50 packs per day. He has  never used smokeless tobacco. He reports that he does not drink alcohol.   History   Social History Narrative    Review of Systems: Has significant nocturia and had recent bladder retention when he had his surgery.  He also complains of significant left knee pain and has had some soreness in his calfs, but attributes this to the rehabilitation that he had.  Other than as noted above the remainder of the review of systems is unremarkable.  Physical Exam: BP 123/71 mmHg  Pulse 92  Temp(Src) 97.7 F (36.5 C) (Oral)  Resp 20  Ht 6' (1.829 m)  Wt 93.6 kg (206 lb 5.6 oz)  BMI 27.98 kg/m2  SpO2 98%  General appearance: Pleasant, very polite black male currently in no acute distress Head: Normocephalic, without obvious abnormality, atraumatic, Balding male hair pattern Eyes: conjunctivae/corneas clear. PERRL, EOM's intact. Fundi not examined Neck: no adenopathy, no carotid bruit, no JVD and supple, symmetrical, trachea midline Lungs: clear to auscultation bilaterally Heart: regular rate and rhythm, S1, S2 normal, no murmur, click, rub or gallop Abdomen: soft, non-tender; bowel sounds normal; no masses,  no organomegaly Rectal: deferred Extremities: No edema noted, Pulses: Femoral pulses 2+, posterior tibial and dorsalis 1+ Neurologic: Grossly normal  Labs: CBC  Recent Labs  02/28/14 1035  WBC 5.8  RBC 3.92*  HGB 12.0*  HCT 35.9*  PLT PLATELET CLUMPING, SUGGEST RECOLLECTION OF SAMPLE IN CITRATE TUBE.  MCV 91.6  MCH 30.6  MCHC 33.4  RDW 14.4  LYMPHSABS 1.3  MONOABS 0.5  EOSABS 0.2  BASOSABS 0.0   CMP   Recent Labs  02/28/14 1035  NA 137  K 3.9  CL 104  CO2 27  GLUCOSE 141*  BUN 13  CREATININE 0.93  CALCIUM 8.7  GFRNONAA 81*  GFRAA >90   Cardiac Panel (last 3 results)  Recent Labs  02/28/14 1035  TROPONINI <0.03     Radiology: Clear lungs, atherosclerotic calcifications of the aortic arch  EKG: Normal sinus rhythm, normal EKG  Signed:  W.  Doristine Church MD [facility]   Cardiology Consultant  02/28/2014, 3:20 PM

## 2014-02-28 NOTE — ED Notes (Signed)
Pt. Had a lt. Hip replacement on 12/24. Has been having Rehab at [facility] .  Pt. Went home for a visit and had a syncopal episode lasting minutes , witnessed by wife.  Paramedics report that he had a syncopal episode with them  . And they saw his Heart rhythm  Go from  NSR to PVC'S and then into sinus brady.  Upon arrival pt. Is alert and oriented X4. Pt. Is feeling weak.  Skin is pale warm and dry.  Pt. Denies any chest pain or sob.   Pt. Received  250cc bolus  En route.

## 2014-02-28 NOTE — H&P (Signed)
[facility] History and Physical                                                                                    Patient Demographics  [name] [name], is a 74 y.o. male  MRN: [id number]   DOB - 03/01/1940  Admit Date - 02/28/2014  Outpatient Primary MD for the patient is No primary care provider on file.   With History of -  History reviewed. No pertinent past medical history.    Past Surgical History  Procedure Laterality Date  . Back surgery    . Intramedullary (im) nail intertrochanteric Left 01/22/2014    Procedure: INTRAMEDULLARY (IM) NAIL INTERTROCHANTRIC;  Surgeon: Melina Schools, MD;  Location: [place];  Service: Orthopedics;  Laterality: Left;    in for   Chief Complaint  Patient presents with  . Loss of Consciousness     HPI  [name] [name]  is a 74 y.o. male, with past medical history of BPH, tobacco abuse, and recent left hip fracture requiring IM nailing on 12/24. He presents to the ER today after 2 syncopal episodes this morning. He was at the kitchen table reading his mail when he felt diaphoretic and dizzy.  He lost consciousness for 3-4 minutes during which he had urinary incontinence (but no tongue biting).  When he awoke his wife noticed he was confused for a short while.  EMS witnessed a second syncopal  episode that lasted for 3-4 minutes as well.  He was bradycardic in the 40s and had pvcs.  [name] [name] has had no history of heart disease, chest pain or palpitations.  On exam he did have numbness in his lower extremities.  In the ER serologies are unrevealing, u/a negative, CT head is negative for acute changes.  Review of Systems    In addition to the HPI above,  No Fever-chills, No Headache, No changes with Vision or hearing, No problems swallowing food or Liquids, No Chest pain, Cough or Shortness of Breath, No Abdominal pain, No Nausea or Vomiting, Bowel movements are regular, No Blood in stool or Urine, No dysuria, No new skin rashes or  bruises, No new joints pains-aches,  No new weakness, tingling, numbness in any extremity, No recent weight gain or loss, No polyuria, polydypsia or polyphagia, No significant Mental Stressors.  A full 10 point Review of Systems was done, except as stated above, all other Review of Systems were negative.   Social History History  Substance Use Topics  . Smoking status: Current Every Day Smoker -- 1.50 packs/day    Types: Cigarettes  . Smokeless tobacco: Not on file  . Alcohol Use: No     Family History No family history on file.   Prior to Admission medications   Medication Sig Start Date End Date Taking? Authorizing Provider  enoxaparin (LOVENOX) 40 MG/0.4ML injection Inject 0.4 mLs (40 mg total) into the skin daily. 01/24/14   Amber Renelda Loma, PA-C  oxyCODONE (OXY IR/ROXICODONE) 5 MG immediate release tablet Take 1-2 tablets (5-10 mg total) by mouth every 4 (four) hours as needed for breakthrough pain ((for MODERATE breakthrough pain)). 01/23/14   Amber Renelda Loma, PA-C  tamsulosin (  FLOMAX) 0.4 MG CAPS capsule Take 2 capsules (0.8 mg total) by mouth daily. 01/25/14   Verlee Monte, MD    No Known Allergies  Physical Exam  Vitals  Blood pressure 123/71, pulse 92, temperature 97.7 F (36.5 C), temperature source Oral, resp. rate 20, height 6' (1.829 m), weight 93.6 kg (206 lb 5.6 oz), SpO2 98 %.   General: Well-developed, well-nourished, pleasant, male lying in bed in NAD, wife and granddaughter at bedside  Psych:  Normal affect and insight, Not Suicidal or Homicidal, Awake Alert, Oriented X 3.  Neuro:    ALL C.Nerves Intact, Strength 5/5 all 4 extremities, patient reports decreased sensation on exam of his lower extremities bilaterally  ENT:  Ears and Eyes appear Normal, Conjunctivae clear, PERR. Moist Oral Mucosa.  Neck:  Supple Neck, No JVD, No cervical lymphadenopathy appreciated  Respiratory:  Symmetrical Chest wall movement, Good air movement  bilaterally, CTAB.  Cardiac:  RRR, No Gallops, Rubs or Murmurs, No Parasternal Heave.  Abdomen:  Positive Bowel Sounds, Abdomen Soft, Non tender, No organomegaly appreciated  Skin:  No Cyanosis, Normal Skin Turgor, No Skin Rash or Bruise.  Extremities:  Good muscle tone,  joints appear normal , no effusions, Normal ROM.   Data Review  CBC  Recent Labs Lab 02/28/14 1035  WBC 5.8  HGB 12.0*  HCT 35.9*  PLT PLATELET CLUMPING, SUGGEST RECOLLECTION OF SAMPLE IN CITRATE TUBE.  MCV 91.6  MCH 30.6  MCHC 33.4  RDW 14.4  LYMPHSABS 1.3  MONOABS 0.5  EOSABS 0.2  BASOSABS 0.0   ------------------------------------------------------------------------------------------------------------------  Chemistries   Recent Labs Lab 02/28/14 1035  NA 137  K 3.9  CL 104  CO2 27  GLUCOSE 141*  BUN 13  CREATININE 0.93  CALCIUM 8.7    Cardiac Enzymes  Recent Labs Lab 02/28/14 1035  TROPONINI <0.03    ---------------------------------------------------------------------------------------------------------------  Urinalysis    Component Value Date/Time   COLORURINE YELLOW 02/28/2014 1326   APPEARANCEUR CLEAR 02/28/2014 1326   LABSPEC 1.014 02/28/2014 1326   PHURINE 7.5 02/28/2014 1326   GLUCOSEU NEGATIVE 02/28/2014 1326   HGBUR NEGATIVE 02/28/2014 1326   BILIRUBINUR NEGATIVE 02/28/2014 1326   [place] 02/28/2014 1326   PROTEINUR NEGATIVE 02/28/2014 1326   UROBILINOGEN 0.2 02/28/2014 1326   NITRITE NEGATIVE 02/28/2014 1326   [place] 02/28/2014 1326    ----------------------------------------------------------------------------------------------------------------  Imaging results:   Dg Chest 2 View  02/28/2014   CLINICAL DATA:  74 year old male with syncope.  EXAM: CHEST - 2 VIEW  COMPARISON:  01/22/2014  FINDINGS: Cardiomediastinal silhouette unchanged in size and contour. Atherosclerotic calcifications of the aortic arch again noted. No  evidence of pulmonary vascular congestion.  No confluent airspace disease, pneumothorax, or pleural effusion.  Degenerative changes of the visualized spine.  No displaced fracture.  No displaced fracture.  IMPRESSION: No radiographic evidence of acute cardiopulmonary disease.  Atherosclerosis  Signed,  Dulcy Fanny. Earleen Newport, DO  [facility]  [facility]   Electronically Signed   By: Corrie Mckusick D.O.   On: 02/28/2014 11:14   Ct Head Wo Contrast  02/28/2014   CLINICAL DATA:  Syncope.  EXAM: CT HEAD WITHOUT CONTRAST  TECHNIQUE: Contiguous axial images were obtained from the base of the skull through the vertex without intravenous contrast.  COMPARISON:  None.  FINDINGS: Bony calvarium appears intact. Mild diffuse cortical atrophy is noted. Mild chronic ischemic white matter disease is noted. No mass effect or midline shift is noted. Ventricular size is within  normal limits. There is no evidence of mass lesion, hemorrhage or acute infarction.  IMPRESSION: Mild diffuse cortical atrophy. Mild chronic ischemic white matter disease. No acute intracranial abnormality seen.   Electronically Signed   By: Sabino Dick M.D.   On: 02/28/2014 11:39    My personal review of EKG: Normal sinus rhythm    Assessment & Plan  Active Problems:   BPH (benign prostatic hypertrophy)   Syncope and collapse   Bradycardia  recent left hip fracture (December 2015)  2 syncopal episodes 1/03 am. Uncertain etiology.  Will attempt to rule out seizure, TIA, cardiac source. Admit to telemetry as observation, 2D echo, EEG, MR Brain, TSH, Troponin.  PT evaluation. Daily orthostatics. Hold tamsulosin for now.  Hypotensive (86/41) Responded in the ER to IVF.  Holding flomax.  Bradycardia. Uncertain etiology.  Not on beta blockers.  Appreciate cardiology evaluation.  Hx of BPH and UTI Holding Flomax.  U/A is negative for infection.   DVT Prophylaxis:  Lovenox  AM Labs Ordered,  also please review Full Orders  Family Communication:   Wife and granddaughter at bedside   Code Status:  Full  Likely DC to  home  Condition:  Guarded  Time spent in minutes : 70    [place], [name] Rumpf PA-C on 02/28/2014 at 3:00 PM  Between 7am to 7pm - Pager - [phone]  After 7pm go to [email or web address] - password [email or web address]  And look for the night coverage person covering me after hours  [facility]  Attending Patient was seen, examined,treatment plan was discussed with the  Advance Practice Provider.  I have directly reviewed the clinical findings, lab, imaging studies and management of this patient in detail. I have made the necessary changes to the above noted documentation, and agree with the documentation, as recorded by the Advance Practice Provider.   Admitted with 2 syncopal episodes following recent surgery. Not hypoxic, no lower extremity swelling, low suspicion for VTE, however will check d-dimer and lower Doppler. Monitor telemetry, check echo and follow clinical course. Cardiology has been consulted.  Nena Alexander MD [facility].

## 2014-02-28 NOTE — ED Notes (Signed)
Pt returned from CT °

## 2014-02-28 NOTE — ED Notes (Signed)
Attempted report 

## 2014-03-01 ENCOUNTER — Inpatient Hospital Stay (HOSPITAL_COMMUNITY): Payer: 59

## 2014-03-01 ENCOUNTER — Encounter (HOSPITAL_COMMUNITY): Payer: Self-pay | Admitting: *Deleted

## 2014-03-01 DIAGNOSIS — R791 Abnormal coagulation profile: Secondary | ICD-10-CM

## 2014-03-01 LAB — CBC
HCT: 34 % — ABNORMAL LOW (ref 39.0–52.0)
HCT: 35 % — ABNORMAL LOW (ref 39.0–52.0)
HEMOGLOBIN: 11.3 g/dL — AB (ref 13.0–17.0)
Hemoglobin: 11.5 g/dL — ABNORMAL LOW (ref 13.0–17.0)
MCH: 31.1 pg (ref 26.0–34.0)
MCH: 30.3 pg (ref 26.0–34.0)
MCHC: 33.2 g/dL (ref 30.0–36.0)
MCHC: 32.9 g/dL (ref 30.0–36.0)
MCV: 93.7 fL (ref 78.0–100.0)
MCV: 92.3 fL (ref 78.0–100.0)
Platelets: UNDETERMINED 10*3/uL (ref 150–400)
Platelets: 151 10*3/uL (ref 150–400)
RBC: 3.63 MIL/uL — AB (ref 4.22–5.81)
RBC: 3.79 MIL/uL — ABNORMAL LOW (ref 4.22–5.81)
RDW: 14.4 % (ref 11.5–15.5)
RDW: 14.2 % (ref 11.5–15.5)
WBC: 5.6 10*3/uL (ref 4.0–10.5)
WBC: 5.9 10*3/uL (ref 4.0–10.5)

## 2014-03-01 LAB — BASIC METABOLIC PANEL
ANION GAP: 4 — AB (ref 5–15)
BUN: 12 mg/dL (ref 6–23)
CHLORIDE: 104 mmol/L (ref 96–112)
CO2: 28 mmol/L (ref 19–32)
CREATININE: 0.88 mg/dL (ref 0.50–1.35)
Calcium: 9.1 mg/dL (ref 8.4–10.5)
GFR calc Af Amer: >90 mL/min (ref >90)
GFR calc non Af Amer: 83 mL/min — ABNORMAL LOW (ref >90)
Glucose, Bld: 106 mg/dL — ABNORMAL HIGH (ref 70–99)
Potassium: 3.8 mmol/L (ref 3.5–5.1)
Sodium: 136 mmol/L (ref 135–145)

## 2014-03-01 LAB — TROPONIN I
Troponin I: <0.03 ng/mL (ref <0.031)
Troponin I: <0.03 ng/mL (ref <0.031)

## 2014-03-01 LAB — APTT
APTT: 38 s — AB (ref 24–37)
APTT: 42 s — AB (ref 24–37)
aPTT: 45 seconds — ABNORMAL HIGH (ref 24–37)

## 2014-03-01 LAB — URINE CULTURE
CULTURE: NO GROWTH
Colony Count: NO GROWTH

## 2014-03-01 MED ORDER — SODIUM CHLORIDE 0.9 % IJ SOLN: 3.0000 mL | INTRAMUSCULAR | Status: DC | PRN

## 2014-03-01 MED ORDER — ARGATROBAN 50 MG/50ML IV SOLN
1.4000 ug/kg/min | INTRAVENOUS | Status: DC
  Administered 2014-03-01: 0.75 ug/kg/min via INTRAVENOUS
  Administered 2014-03-02: 1.4 ug/kg/min via INTRAVENOUS
  Filled 2014-03-01 (×3): qty 50

## 2014-03-01 MED ORDER — SODIUM CHLORIDE 0.9 % IJ SOLN: 3.0000 mL | Freq: Two times a day (BID) | INTRAMUSCULAR | Status: DC

## 2014-03-01 MED ORDER — GADOBENATE DIMEGLUMINE 529 MG/ML IV SOLN
15.0000 mL | Freq: Once | INTRAVENOUS | Status: AC | PRN
  Administered 2014-03-01: 15 mL via INTRAVENOUS

## 2014-03-01 MED ORDER — SODIUM CHLORIDE 0.9 % IV SOLN: 250.0000 mL | INTRAVENOUS | Status: DC | PRN

## 2014-03-01 MED ORDER — IOHEXOL 350 MG/ML SOLN
100.0000 mL | Freq: Once | INTRAVENOUS | Status: AC | PRN
  Administered 2014-03-01: 100 mL via INTRAVENOUS

## 2014-03-01 NOTE — Progress Notes (Signed)
Subjective:  No recurrent syncope overnight.  Initial vital signs show mild orthostasis.  Blood pressure preserved today.  No bradycardia at night.  D-dimer mildly elevated.  Objective:  Vital Signs in the last 24 hours: BP 129/80 mmHg  Pulse 81  Temp(Src) 98.4 F (36.9 C) (Oral)  Resp 20  Ht 6' (1.829 m)  Wt 93.033 kg (205 lb 1.6 oz)  BMI 27.81 kg/m2  SpO2 97%  Physical Exam: Pleasant black male in no acute distress Lungs:  Clear Cardiac:  Regular rhythm, normal S1 and S2, no S3 Extremities:  Trace edema present  Intake/Output from previous day: 01/30 0701 - 01/31 0700 In: 1300 [P.O.:300; I.V.:1000] Out: 700 [Urine:700]  Weight Filed Weights   02/28/14 1401 03/01/14 0549  Weight: 93.6 kg (206 lb 5.6 oz) 93.033 kg (205 lb 1.6 oz)    Lab Results: Basic Metabolic Panel:  Recent Labs  02/28/14 1035 02/28/14 1631  NA 137  --   K 3.9  --   CL 104  --   CO2 27  --   GLUCOSE 141*  --   BUN 13  --   CREATININE 0.93 0.79   CBC:  Recent Labs  02/28/14 1035 02/28/14 1631 03/01/14 0403  WBC 5.8 6.1 5.6  NEUTROABS 3.8  --   --   HGB 12.0* 11.6* 11.3*  HCT 35.9* 34.3* 34.0*  MCV 91.6 90.7 93.7  PLT PLATELET CLUMPING, SUGGEST RECOLLECTION OF SAMPLE IN CITRATE TUBE. 110* 29*   Cardiac Enzymes:  Recent Labs  02/28/14 2202 03/01/14 0403 03/01/14 0918  TROPONINI <0.03 <0.03 <0.03    Telemetry: Sinus rhythm, no bradycardia noted.  Assessment/Plan:  1.  Syncope that may have been vasovagal but with recent hip surgery pulmonary emboli possibility 2.  Atherosclerotic calcification noted on chest x-ray 3.  Recent hip surgery 4.  BPH with a high dose of Flomax that could be contributing to orthostasis  Recommendations:  Reduce Flomax dose.  Await venous Dopplers of lower extremity.  May consider pulmonary CT to look for pulmonary emboli.     Kerry Hough  MD [facility]  03/01/2014, 10:32 AM

## 2014-03-01 NOTE — Progress Notes (Addendum)
PROGRESS NOTE  KAYLOB WALLEN CBJ:[id number] DOB: 12-14-1940 DOA: 02/28/2014 PCP: No primary care provider on file.  Assessment/Plan: 2 syncopal episodes 1/60 am. Uncertain etiology.  EEG, MRI,  CTA pending for elevated d dimer -duplex LE b/l TSH ok, Troponinok. PT evaluation. Daily orthostatics. Hold tamsulosin for now.  Hypotensive (86/41) Responded in the ER to IVF. Holding flomax.  Bradycardia. Uncertain etiology. Not on beta blockers. Appreciate cardiology evaluation.  Hx of BPH and UTI Holding Flomax. U/A is negative for infection.  Thrombocytopenia -repeat in citrated tube- still low  +PE on CTA -will d/c lovenox -start HIT protocol   Code Status: full Family Communication: patient Disposition Plan:    Consultants:  cardiology  Procedures:      HPI/Subjective: Feeling better, sitting on side of bed  Objective: Filed Vitals:   03/01/14 0549  BP: 129/80  Pulse: 81  Temp: 98.4 F (36.9 C)  Resp: 20    Intake/Output Summary (Last 24 hours) at 03/01/14 1104 Last data filed at 03/01/14 0830  Gross per 24 hour  Intake   1660 ml  Output    900 ml  Net    760 ml   Filed Weights   02/28/14 1401 03/01/14 0549  Weight: 93.6 kg (206 lb 5.6 oz) 93.033 kg (205 lb 1.6 oz)    Exam:   General:  A+ox3, NAD  Cardiovascular: rrr  Respiratory: clear  Abdomen: +Bs, soft  Musculoskeletal: no edema   Data Reviewed: Basic Metabolic Panel:  Recent Labs Lab 02/28/14 1035 02/28/14 1631  NA 137  --   K 3.9  --   CL 104  --   CO2 27  --   GLUCOSE 141*  --   BUN 13  --   CREATININE 0.93 0.79  CALCIUM 8.7  --    Liver Function Tests: No results for input(s): AST, ALT, ALKPHOS, BILITOT, PROT, ALBUMIN in the last 168 hours. No results for input(s): LIPASE, AMYLASE in the last 168 hours. No results for input(s): AMMONIA in the last 168 hours. CBC:  Recent Labs Lab 02/28/14 1035 02/28/14 1631 03/01/14 0403  WBC 5.8 6.1 5.6    NEUTROABS 3.8  --   --   HGB 12.0* 11.6* 11.3*  HCT 35.9* 34.3* 34.0*  MCV 91.6 90.7 93.7  PLT PLATELET CLUMPING, SUGGEST RECOLLECTION OF SAMPLE IN CITRATE TUBE. 110* 29*   Cardiac Enzymes:  Recent Labs Lab 02/28/14 1035 02/28/14 2202 03/01/14 0403 03/01/14 0918  TROPONINI <0.03 <0.03 <0.03 <0.03   BNP (last 3 results) No results for input(s): PROBNP in the last 8760 hours. CBG:  Recent Labs Lab 02/28/14 1044  GLUCAP 124*    No results found for this or any previous visit (from the past 240 hour(s)).   Studies: Dg Chest 2 View  02/28/2014   CLINICAL DATA:  74 year old male with syncope.  EXAM: CHEST - 2 VIEW  COMPARISON:  01/22/2014  FINDINGS: Cardiomediastinal silhouette unchanged in size and contour. Atherosclerotic calcifications of the aortic arch again noted. No evidence of pulmonary vascular congestion.  No confluent airspace disease, pneumothorax, or pleural effusion.  Degenerative changes of the visualized spine.  No displaced fracture.  No displaced fracture.  IMPRESSION: No radiographic evidence of acute cardiopulmonary disease.  Atherosclerosis  Signed,  Dulcy Fanny. Earleen Newport, DO  [facility]  [facility]   Electronically Signed   By: Corrie Mckusick D.O.   On: 02/28/2014 11:14   Ct Head Wo Contrast  02/28/2014   CLINICAL DATA:  Syncope.  EXAM: CT HEAD WITHOUT CONTRAST  TECHNIQUE: Contiguous axial images were obtained from the base of the skull through the vertex without intravenous contrast.  COMPARISON:  None.  FINDINGS: Bony calvarium appears intact. Mild diffuse cortical atrophy is noted. Mild chronic ischemic white matter disease is noted. No mass effect or midline shift is noted. Ventricular size is within normal limits. There is no evidence of mass lesion, hemorrhage or acute infarction.  IMPRESSION: Mild diffuse cortical atrophy. Mild chronic ischemic white matter disease. No acute intracranial abnormality seen.    Electronically Signed   By: Sabino Dick M.D.   On: 02/28/2014 11:39    Scheduled Meds: . docusate sodium  100 mg Oral BID  . enoxaparin (LOVENOX) injection  40 mg Subcutaneous Q24H  . sodium chloride  3 mL Intravenous Q12H   Continuous Infusions:  Antibiotics Given (last 72 hours)    None      Active Problems:   BPH (benign prostatic hypertrophy)   Syncope and collapse   Bradycardia    Time spent: 35 min    Almeda Ezra, [facility] Pager [phone]. If 7PM-7AM, please contact night-coverage at [email or web address], password [facility] 03/01/2014, 11:04 AM  LOS: 1 day

## 2014-03-01 NOTE — Evaluation (Signed)
Physical Therapy Evaluation Patient Details Name: [name] [name] MRN: [id number] DOB: 08-11-1940 Today's Date: 03/01/2014   History of Present Illness  [name] [name]  is a 74 y.o. male, with past medical history of BPH, tobacco abuse, and recent left hip fracture requiring IM nailing on 12/24. He presents to the ER today after 2 syncopal episodes this morning.  Clinical Impression  Pt admitted with above diagnosis. Pt currently with functional limitations due to the deficits listed below (see PT Problem List). Pt mobilizing well with RW, supervision level, limited ROM left hip and knee which affects gait pattern. Instructed to ambulate with RW in hall 3-5x/ day.  Pt will benefit from skilled PT to increase their independence and safety with mobility to allow discharge to the venue listed below.       Follow Up Recommendations SNF    Equipment Recommendations  None recommended by PT    Recommendations for Other Services       Precautions / Restrictions Precautions Precautions: None Restrictions Weight Bearing Restrictions: No      Mobility  Bed Mobility Overal bed mobility: Modified Independent                Transfers Overall transfer level: Modified independent Equipment used: Rolling walker (2 wheeled)                Ambulation/Gait Ambulation/Gait assistance: Supervision Ambulation Distance (Feet): 75 Feet Assistive device: Rolling walker (2 wheeled) Gait Pattern/deviations: Step-through pattern;Antalgic;Trunk flexed Gait velocity: decreased Gait velocity interpretation: Below normal speed for age/gender General Gait Details: distance limited by pain left hip and knee, unable to put full wt through LLE. Pt with limited left hip and knee extension to lacking proper heel strike on left and flexing at trunk in stance phase. Worked on this during ambulation but pt reports that he ambulates this way due to pain and is having a hard time correcting.  Stairs             Wheelchair Mobility    Modified Rankin (Stroke Patients Only)       Balance Overall balance assessment: Needs assistance Sitting-balance support: No upper extremity supported Sitting balance-Leahy Scale: Normal     Standing balance support: No upper extremity supported Standing balance-Leahy Scale: Good Standing balance comment: can stand without UE support but balance limited due to pain and dysfucntion left hip                             Pertinent Vitals/Pain Pain Assessment: Faces Faces Pain Scale: Hurts little more Pain Location: left hip and knee Pain Descriptors / Indicators: Aching Pain Intervention(s): Monitored during session;Limited activity within patient's tolerance    Home Living Family/patient expects to be discharged to:: Skilled nursing facility Living Arrangements: Spouse/significant other               Additional Comments: pt has not followed up with Dr. Rolena [name] from hip surgery, will likely d/c home from SNF soon after that appt next week as he has been going home on day passes    Prior Function Level of Independence: Needs assistance   Gait / Transfers Assistance Needed: supervision with RW, reports that he cannot get in and out of a regular car yet  ADL's / Homemaking Assistance Needed: needs wife's help washing his feet        Hand Dominance        Extremity/Trunk Assessment   Upper Extremity Assessment:  Overall WFL for tasks assessed           Lower Extremity Assessment: LLE deficits/detail   LLE Deficits / Details: hip flex 3/5, knee flex ROM limited by stiffness/ OA left knee  Cervical / Trunk Assessment: Normal  Communication   Communication: No difficulties  Cognition Arousal/Alertness: Awake/alert Behavior During Therapy: WFL for tasks assessed/performed Overall Cognitive Status: Within Functional Limits for tasks assessed                      General Comments      Exercises Total  Joint Exercises Standing Hip Extension: Left;Standing;10 reps;AROM General Exercises - Lower Extremity Long Arc Quad: AROM;Left;10 reps;Seated Heel Slides: AROM;Left;5 reps;Seated Straight Leg Raises: AROM;Left;5 reps;Seated      Assessment/Plan    PT Assessment Patient needs continued PT services  PT Diagnosis Abnormality of gait;Acute pain   PT Problem List Decreased strength;Decreased range of motion;Decreased activity tolerance;Decreased balance;Decreased mobility;Pain  PT Treatment Interventions DME instruction;Gait training;Functional mobility training;Therapeutic activities;Stair training;Therapeutic exercise;Balance training;Patient/family education;Neuromuscular re-education   PT Goals (Current goals can be found in the Care Plan section) Acute Rehab PT Goals Patient Stated Goal: finish rehab and return home PT Goal Formulation: With patient Time For Goal Achievement: 03/15/14 Potential to Achieve Goals: Good    Frequency Min 3X/week   Barriers to discharge        Co-evaluation               End of Session Equipment Utilized During Treatment: Gait belt Activity Tolerance: Patient tolerated treatment well Patient left: in chair;with call bell/phone within reach Nurse Communication: Mobility status         Time: 0301-3143 PT Time Calculation (min) (ACUTE ONLY): 32 min   Charges:   PT Evaluation $Initial PT Evaluation Tier I: 1 Procedure PT Treatments $Gait Training: 8-22 mins   PT G Codes:      Leighton Roach, PT  Acute Rehab Services  [place], [place] 03/01/2014, 1:43 PM

## 2014-03-01 NOTE — Progress Notes (Signed)
ANTICOAGULATION CONSULT NOTE - Initial Consult  Pharmacy Consult for Argatroban Indication: pulmonary embolus; questionable HIT  No Known Allergies  Patient Measurements: Height: 6' (182.9 cm) Weight: 205 lb 1.6 oz (93.033 kg) IBW/kg (Calculated) : 77.6  Vital Signs: Temp: 98.4 F (36.9 C) (01/31 0549) Temp Source: Oral (01/31 0549) BP: 129/80 mmHg (01/31 0549) Pulse Rate: 81 (01/31 0549)  Labs:  Recent Labs  02/28/14 1035 02/28/14 1631 02/28/14 2202 03/01/14 0403 03/01/14 0918 03/01/14 1030  HGB 12.0* 11.6*  --  11.3*  --   --   HCT 35.9* 34.3*  --  34.0*  --   --   PLT PLATELET CLUMPING, SUGGEST RECOLLECTION OF SAMPLE IN CITRATE TUBE. 110*  --  29*  --  32*  CREATININE 0.93 0.79  --   --   --   --   TROPONINI <0.03  --  <0.03 <0.03 <0.03  --     Estimated Creatinine Clearance: 88.9 mL/min (by C-G formula based on Cr of 0.79).   Medical History: Past Medical History  Diagnosis Date  . BPH (benign prostatic hypertrophy)   . Osteoarthritis   . Lumbar disc disease     Medications:  Prescriptions prior to admission  Medication Sig Dispense Refill Last Dose  . aspirin 325 MG tablet Take 325 mg by mouth daily.   02/28/2014 at Unknown time  . docusate sodium (COLACE) 100 MG capsule Take 100 mg by mouth daily.   02/28/2014 at Unknown time  . enoxaparin (LOVENOX) 40 MG/0.4ML injection Inject 0.4 mLs (40 mg total) into the skin daily. 7 Syringe 0 02/24/2014  . oxyCODONE (OXY IR/ROXICODONE) 5 MG immediate release tablet Take 1-2 tablets (5-10 mg total) by mouth every 4 (four) hours as needed for breakthrough pain ((for MODERATE breakthrough pain)). 60 tablet 0 02/27/2014 at Unknown time  . tamsulosin (FLOMAX) 0.4 MG CAPS capsule Take 2 capsules (0.8 mg total) by mouth daily.   02/28/2014 at Unknown time   Scheduled:  . docusate sodium  100 mg Oral BID  . sodium chloride  3 mL Intravenous Q12H  . sodium chloride  3 mL Intravenous Q12H    Assessment: 74 yo M a/p recent  hip surgery (01/22/14) and on Lovenox outpt for VTE prophylaxis (through 02/24/14).  Pt readmitted 1/30 after two syncopal episodes.  Pt received Lovenox x 1 dose 1/30 PM.  PLTC has dropped 110 > 29 raising concern for HIT.  As part of work-up for syncope, d-dimer was checked, which was slightly elevated, and CTA done found + PE.  To start Argatroban per pharmacy to treat PE until HIT ruled out.  Will start infusion at 0.75 mcg/kg/min (mid range starting dose) to aggressively treat PE while remaining cautious with thrombocytopenia.  Goal of Therapy:  aPTT 50-90 seconds Monitor platelets by anticoagulation protocol: Yes   Plan:  Will ask lab to do baseline aPTT to previous PLTC lab draw. Start Argatroban at 0.75 mcg/kg/min (= 4.2 ml/hr) Check aPTT 2 hours after initiation of therapy. Daily aPTT and CBC while on Argatroban.  [employer], Pharm.D., BCPS Clinical Pharmacist Pager [phone] 03/01/2014 1:29 PM

## 2014-03-01 NOTE — Progress Notes (Signed)
ANTICOAGULATION CONSULT NOTE - Initial Consult  Pharmacy Consult for Argatroban Indication: pulmonary embolus; questionable HIT  Allergies  Allergen Reactions  . Heparin     Patient Measurements: Height: 6' (182.9 cm) Weight: 205 lb 1.6 oz (93.033 kg) IBW/kg (Calculated) : 77.6  Vital Signs: Temp: 98.2 F (36.8 C) (01/31 1340) Temp Source: Oral (01/31 1340) BP: 131/72 mmHg (01/31 1340) Pulse Rate: 84 (01/31 1340)  Labs:  Recent Labs  02/28/14 1035 02/28/14 1631 02/28/14 2202 03/01/14 0403 03/01/14 0918 03/01/14 1030 03/01/14 1640 03/01/14 1820  HGB 12.0* 11.6*  --  11.3*  --   --   --   --   HCT 35.9* 34.3*  --  34.0*  --   --   --   --   PLT PLATELET CLUMPING, SUGGEST RECOLLECTION OF SAMPLE IN CITRATE TUBE. 110*  --  29*  --  32*  --   --   APTT  --   --   --   --   --   --  38* 42*  CREATININE 0.93 0.79  --   --   --   --   --   --   TROPONINI <0.03  --  <0.03 <0.03 <0.03  --   --   --     Estimated Creatinine Clearance: 88.9 mL/min (by C-G formula based on Cr of 0.79).   Medical History: Past Medical History  Diagnosis Date  . BPH (benign prostatic hypertrophy)   . Osteoarthritis   . Lumbar disc disease     Medications:  Prescriptions prior to admission  Medication Sig Dispense Refill Last Dose  . aspirin 325 MG tablet Take 325 mg by mouth daily.   02/28/2014 at Unknown time  . docusate sodium (COLACE) 100 MG capsule Take 100 mg by mouth daily.   02/28/2014 at Unknown time  . enoxaparin (LOVENOX) 40 MG/0.4ML injection Inject 0.4 mLs (40 mg total) into the skin daily. 7 Syringe 0 02/24/2014  . oxyCODONE (OXY IR/ROXICODONE) 5 MG immediate release tablet Take 1-2 tablets (5-10 mg total) by mouth every 4 (four) hours as needed for breakthrough pain ((for MODERATE breakthrough pain)). 60 tablet 0 02/27/2014 at Unknown time  . tamsulosin (FLOMAX) 0.4 MG CAPS capsule Take 2 capsules (0.8 mg total) by mouth daily.   02/28/2014 at Unknown time   Scheduled:  .  docusate sodium  100 mg Oral BID  . sodium chloride  3 mL Intravenous Q12H  . sodium chloride  3 mL Intravenous Q12H    Assessment: 74 yo M a/p recent hip surgery (01/22/14) and on Lovenox outpt for VTE prophylaxis (through 02/24/14).  Pt readmitted 1/30 after two syncopal episodes.  Pt received Lovenox x 1 dose 1/30 PM.  PLTC has dropped 110 > 29 raising concern for HIT.  As part of work-up for syncope, d-dimer was checked, which was slightly elevated, and CTA done found + PE.  To start Argatroban per pharmacy to treat PE until HIT ruled out.  Will start infusion at 0.75 mcg/kg/min (mid range starting dose) to aggressively treat PE while remaining cautious with thrombocytopenia.  Initial aPTT is subtherapeutic at 42 on 0.75 mcg/kg/min. Nurse reports no bleeding. Goal of Therapy:  aPTT 50-90 seconds Monitor platelets by anticoagulation protocol: Yes   Plan:  Increase Argatroban to 0.9 mcg/kg/min Check aPTT 2 hours after initiation of therapy. Daily aPTT and CBC while on Argatroban.  Andrey Cota. Diona Foley, PharmD Clinical Pharmacist Pager [phone]  03/01/2014 8:24 PM

## 2014-03-02 ENCOUNTER — Inpatient Hospital Stay (HOSPITAL_COMMUNITY): Payer: 59

## 2014-03-02 DIAGNOSIS — D696 Thrombocytopenia, unspecified: Secondary | ICD-10-CM

## 2014-03-02 DIAGNOSIS — I2699 Other pulmonary embolism without acute cor pulmonale: Principal | ICD-10-CM

## 2014-03-02 LAB — PLATELET COUNT: PLATELETS: UNDETERMINED 10*3/uL (ref 150–400)

## 2014-03-02 LAB — APTT
APTT: 32 s (ref 24–37)
APTT: 34 s (ref 24–37)
APTT: 43 s — AB (ref 24–37)
aPTT: 49 seconds — ABNORMAL HIGH (ref 24–37)

## 2014-03-02 LAB — PROTIME-INR
INR: 1.63 — ABNORMAL HIGH (ref 0.00–1.49)
Prothrombin Time: 19.4 seconds — ABNORMAL HIGH (ref 11.6–15.2)

## 2014-03-02 MED ORDER — RIVAROXABAN 20 MG PO TABS: 20.0000 mg | ORAL_TABLET | Freq: Every day | ORAL | Status: DC

## 2014-03-02 MED ORDER — RIVAROXABAN 15 MG PO TABS
15.0000 mg | ORAL_TABLET | Freq: Two times a day (BID) | ORAL | Status: DC
  Administered 2014-03-02 – 2014-03-03 (×2): 15 mg via ORAL
  Filled 2014-03-02 (×2): qty 1

## 2014-03-02 MED ORDER — ARGATROBAN 50 MG/50ML IV SOLN
1.7000 ug/kg/min | INTRAVENOUS | Status: DC
  Administered 2014-03-02: 1.7 ug/kg/min via INTRAVENOUS
  Filled 2014-03-02 (×2): qty 50

## 2014-03-02 MED ORDER — ARGATROBAN 50 MG/50ML IV SOLN: 2.0000 ug/kg/min | INTRAVENOUS | Status: DC

## 2014-03-02 NOTE — Progress Notes (Signed)
UR Completed Numa Heatwole Graves-Bigelow, RN,BSN [phone]  

## 2014-03-02 NOTE — Progress Notes (Addendum)
ANTICOAGULATION CONSULT NOTE - Follow up  Pharmacy Consult for Argatroban Indication: pulmonary embolus; questionable HIT  Allergies  Allergen Reactions  . Heparin     Patient Measurements: Height: 6' (182.9 cm) Weight: 209 lb 6.4 oz (94.983 kg) IBW/kg (Calculated) : 77.6  Vital Signs: Temp: 98 F (36.7 C) (02/01 0500) Temp Source: Oral (02/01 0500)  Labs:  Recent Labs  02/28/14 1035 02/28/14 1631 02/28/14 2202 03/01/14 0403 03/01/14 0918 03/01/14 1030  03/01/14 2300 03/02/14 0342 03/02/14 0540 03/02/14 0903  HGB 12.0* 11.6*  --  11.3*  --   --   --  11.5*  --   --   --   HCT 35.9* 34.3*  --  34.0*  --   --   --  35.0*  --   --   --   PLT PLATELET CLUMPING, SUGGEST RECOLLECTION OF SAMPLE IN CITRATE TUBE. 110*  --  PLATELET CLUMPS NOTED ON SMEAR, UNABLE TO ESTIMATE  --  32*  --  151  --   --   --   APTT  --   --   --   --   --   --   < > 45* 32 34 43*  CREATININE 0.93 0.79  --   --   --   --   --  0.88  --   --   --   TROPONINI <0.03  --  <0.03 <0.03 <0.03  --   --   --   --   --   --   < > = values in this interval not displayed.  Estimated Creatinine Clearance: 88.1 mL/min (by C-G formula based on Cr of 0.88).   Medical History: Past Medical History  Diagnosis Date  . BPH (benign prostatic hypertrophy)   . Osteoarthritis   . Lumbar disc disease     Medications:  Prescriptions prior to admission  Medication Sig Dispense Refill Last Dose  . aspirin 325 MG tablet Take 325 mg by mouth daily.   02/28/2014 at Unknown time  . docusate sodium (COLACE) 100 MG capsule Take 100 mg by mouth daily.   02/28/2014 at Unknown time  . enoxaparin (LOVENOX) 40 MG/0.4ML injection Inject 0.4 mLs (40 mg total) into the skin daily. 7 Syringe 0 02/24/2014  . oxyCODONE (OXY IR/ROXICODONE) 5 MG immediate release tablet Take 1-2 tablets (5-10 mg total) by mouth every 4 (four) hours as needed for breakthrough pain ((for MODERATE breakthrough pain)). 60 tablet 0 02/27/2014 at Unknown time   . tamsulosin (FLOMAX) 0.4 MG CAPS capsule Take 2 capsules (0.8 mg total) by mouth daily.   02/28/2014 at Unknown time   Scheduled:  . docusate sodium  100 mg Oral BID  . sodium chloride  3 mL Intravenous Q12H  . sodium chloride  3 mL Intravenous Q12H    Assessment: APTT =43 seconds approximately 2 hours after agratroban drip increased to 1.4 mcg/kg/min (=7.8 ml/hr).  Remains subtherapeutic on argatroban for PE and possible HIT but aPTT is increasing toward goal 50-90 secondse. No bleeding noted.  PLTC has increased to 151K today from 32K yesterday.   Goal of Therapy:  aPTT 50-90 seconds Monitor platelets by anticoagulation protocol: Yes   Plan:  Increase Argatroban to 1.7 mcg/kg/min Check aPTT 2 hours  Daily aPTT and CBC while on Argatroban.  Nicole Cella, RPh Clinical Pharmacist Pager: [phone] 03/02/2014 10:45 AM

## 2014-03-02 NOTE — Progress Notes (Signed)
CSW (Clinical Education officer, museum) made aware from facility that pt was admitted from [facility]. Facility agreeable to pt returning when medically stable. Full CSW assessment to follow once CSW has spoken with pt and/or pt family.  [place], [place]

## 2014-03-02 NOTE — Procedures (Signed)
ELECTROENCEPHALOGRAM REPORT   Patient: [name] [name]       Room #: 4V40 EEG No. ID: [id number] Age: 74 y.o.        Sex: male Referring Physician: Eliseo Squires Report Date:  03/02/2014        Interpreting Physician: Alexis Goodell  History: [name] is an 74 y.o. male with recurrent episodes of syncope  Medications:  Scheduled: . docusate sodium  100 mg Oral BID  . sodium chloride  3 mL Intravenous Q12H  . sodium chloride  3 mL Intravenous Q12H    Conditions of Recording:  This is a 16 channel EEG carried out with the patient in the awake state.  Description:  The waking background activity consists of a low voltage, symmetrical, fairly well organized, 9 Hz alpha activity, seen from the parieto-occipital and posterior temporal regions.  Low voltage fast activity, poorly organized, is seen anteriorly and is at times superimposed on more posterior regions.  A mixture of theta and alpha rhythms are seen from the central and temporal regions. No epileptiform activity is noted. The patient does not drowse or sleep. Hyperventilation was not performed.  Intermittent photic stimulation was performed and elicits a symmetrical driving response a higher frequencies but fails to elicit any abnormalities.  IMPRESSION: This is a normal awake electroencephalogram.  No epileptiform activity is noted.  Comment:  An EEG with the patient sleep deprived to elicit drowse and light sleep may be desirable to further elicit a possible seizure disorder.     Alexis Goodell, MD [facility] [phone] 03/02/2014, 1:34 PM

## 2014-03-02 NOTE — Progress Notes (Signed)
EEG completed; results pending.    

## 2014-03-02 NOTE — Discharge Instructions (Signed)
Information on my medicine - XARELTO (rivaroxaban)  This medication education was reviewed with me or my healthcare representative as part of my discharge preparation.  The pharmacist that spoke with me during my hospital stay was:  Nadine Counts, [place]? Xarelto was prescribed to treat blood clots that may have been found in the veins of your legs (deep vein thrombosis) or in your lungs (pulmonary embolism) and to reduce the risk of them occurring again.  What do you need to know about Xarelto? The starting dose is one 15 mg tablet taken TWICE daily with food for the FIRST 21 DAYS then on 03/24/14 the dose is changed to one 20 mg tablet taken ONCE A DAY with your evening meal.  DO NOT stop taking Xarelto without talking to the health care provider who prescribed the medication.  Refill your prescription for 20 mg tablets before you run out.  After discharge, you should have regular check-up appointments with your healthcare provider that is prescribing your Xarelto.  In the future your dose may need to be changed if your kidney function changes by a significant amount.  What do you do if you miss a dose? If you are taking Xarelto TWICE DAILY and you miss a dose, take it as soon as you remember. You may take two 15 mg tablets (total 30 mg) at the same time then resume your regularly scheduled 15 mg twice daily the next day.  If you are taking Xarelto ONCE DAILY and you miss a dose, take it as soon as you remember on the same day then continue your regularly scheduled once daily regimen the next day. Do not take two doses of Xarelto at the same time.   Important Safety Information Xarelto is a blood thinner medicine that can cause bleeding. You should call your healthcare provider right away if you experience any of the following: ? Bleeding from an injury or your nose that does not stop. ? Unusual colored urine (red or dark brown) or unusual  colored stools (red or black). ? Unusual bruising for unknown reasons. ? A serious fall or if you hit your head (even if there is no bleeding).  Some medicines may interact with Xarelto and might increase your risk of bleeding while on Xarelto. To help avoid this, consult your healthcare provider or pharmacist prior to using any new prescription or non-prescription medications, including herbals, vitamins, non-steroidal anti-inflammatory drugs (NSAIDs) and supplements.  This website has more information on Xarelto: [email or web address]

## 2014-03-02 NOTE — Progress Notes (Signed)
ANTICOAGULATION CONSULT NOTE - Follow Up Consult  Pharmacy Consult for argatroban Indication: pulmonary embolus  Labs:  Recent Labs  02/28/14 1035 02/28/14 1631 02/28/14 2202 03/01/14 0403 03/01/14 0918 03/01/14 1030  03/01/14 2300 03/02/14 0342 03/02/14 0540  HGB 12.0* 11.6*  --  11.3*  --   --   --  11.5*  --   --   HCT 35.9* 34.3*  --  34.0*  --   --   --  35.0*  --   --   PLT PLATELET CLUMPING, SUGGEST RECOLLECTION OF SAMPLE IN CITRATE TUBE. 110*  --  PLATELET CLUMPS NOTED ON SMEAR, UNABLE TO ESTIMATE  --  32*  --  151  --   --   APTT  --   --   --   --   --   --   < > 45* 32 34  CREATININE 0.93 0.79  --   --   --   --   --  0.88  --   --   TROPONINI <0.03  --  <0.03 <0.03 <0.03  --   --   --   --   --   < > = values in this interval not displayed.   Assessment: 74yo male remains subtherapeutic on argatroban for PE and possible HIT with lower PTT despite increased rate.  Goal of Therapy:  aPTT 50-90 seconds   Plan:  Will increase agratroban gtt by ~25% to 1.4 mcg/kg/min and check PTT in 2hr.  Wynona Neat, PharmD, BCPS  03/02/2014,6:56 AM

## 2014-03-02 NOTE — Progress Notes (Signed)
Echocardiogram 2D Echocardiogram has been performed.  [name] [name] 03/02/2014, 12:19 PM

## 2014-03-02 NOTE — Progress Notes (Signed)
Subjective:  No recurrent syncope.  No bradycardia noted.  Event since yesterday reviewed including findings of small pulmonary embolus as well as thrombocytopenia.  Objective:  Vital Signs in the last 24 hours: BP 125/83 mmHg  Pulse 79  Temp(Src) 98 F (36.7 C) (Oral)  Resp 20  Ht 6' (1.829 m)  Wt 94.983 kg (209 lb 6.4 oz)  BMI 28.39 kg/m2  SpO2 97%  Physical Exam: Pleasant black male in no acute distress Lungs:  Clear Cardiac:  Regular rhythm, normal S1 and S2, no S3 Extremities:  Trace edema present  Intake/Output from previous day: 01/31 0701 - 02/01 0700 In: 2041.7 [P.O.:1860; I.V.:181.7] Out: 2275 [Urine:2275]  Weight Filed Weights   02/28/14 1401 03/01/14 0549 03/02/14 0500  Weight: 93.6 kg (206 lb 5.6 oz) 93.033 kg (205 lb 1.6 oz) 94.983 kg (209 lb 6.4 oz)    Lab Results: Basic Metabolic Panel:  Recent Labs  02/28/14 1035 02/28/14 1631 03/01/14 2300  NA 137  --  136  K 3.9  --  3.8  CL 104  --  104  CO2 27  --  28  GLUCOSE 141*  --  106*  BUN 13  --  12  CREATININE 0.93 0.79 0.88   CBC:  Recent Labs  02/28/14 1035  03/01/14 0403 03/01/14 1030 03/01/14 2300  WBC 5.8  < > 5.6  --  5.9  NEUTROABS 3.8  --   --   --   --   HGB 12.0*  < > 11.3*  --  11.5*  HCT 35.9*  < > 34.0*  --  35.0*  MCV 91.6  < > 93.7  --  92.3  PLT PLATELET CLUMPING, SUGGEST RECOLLECTION OF SAMPLE IN CITRATE TUBE.  < > PLATELET CLUMPS NOTED ON SMEAR, UNABLE TO ESTIMATE 32* 151  < > = values in this interval not displayed. Cardiac Enzymes:  Recent Labs  02/28/14 2202 03/01/14 0403 03/01/14 0918  TROPONINI <0.03 <0.03 <0.03    Telemetry: Sinus rhythm, no bradycardia noted.  Assessment/Plan:  1.  Syncope that may have been vasovagal but pulmonary embolus now noted and could have also been cause.   2.  Atherosclerotic calcification noted on chest x-ray 3.  Recent hip surgery 4.  BPH with a high dose of Flomax that could be contributing to orthostasis.  Flomax has  been discontinued.    Recommendations:  His platelet count is normal today.  I think this makes hit much less likely.  I would be in favor of changing him to [facility] for treatment of pulmonary embolus and off of his current intravenous medicine.  Orthostatics appear better off of Flomax.  His echo shows some RV enlargement that could be due to a prior pulmonary embolus or could be due to lung disease from smoking.      Kerry Hough  MD [facility]  03/02/2014, 1:59 PM

## 2014-03-02 NOTE — Progress Notes (Addendum)
PROGRESS NOTE  [name] [name] XBM:[id number] DOB: 1940-03-17 DOA: 02/28/2014 PCP: No primary care provider on file.  Assessment/Plan: 2 syncopal episodes 0/27 am. Uncertain etiology.  EEG, MRI ok,  +PE on CTA -duplex LE b/l pending TSH ok, Troponinok. Hold tamsulosin for now.  Hypotensive (86/41) Responded in the ER to IVF. Holding flomax.  Bradycardia. Uncertain etiology. Not on beta blockers. Appreciate cardiology evaluation.  Hx of BPH and UTI Holding Flomax. U/A is negative for infection.  Thrombocytopenia ?HIT -ordered HIT panel -consult heme onc -attempted to run again in citrated tube but per lab, had already been discarded  +PE on CTA -will d/c lovenox -start HIT protocol   Code Status: full Family Communication: patient Disposition Plan: back to SNF once medically stable   Consultants:  cardiology  Procedures:      HPI/Subjective: No overnight events  Objective: Filed Vitals:   03/02/14 0500  BP:   Pulse:   Temp: 98 F (36.7 C)  Resp: 20    Intake/Output Summary (Last 24 hours) at 03/02/14 1115 Last data filed at 03/02/14 0847  Gross per 24 hour  Intake 1681.7 ml  Output   2125 ml  Net -443.3 ml   Filed Weights   02/28/14 1401 03/01/14 0549 03/02/14 0500  Weight: 93.6 kg (206 lb 5.6 oz) 93.033 kg (205 lb 1.6 oz) 94.983 kg (209 lb 6.4 oz)    Exam:   General:  A+ox3, NAD  Cardiovascular: rrr  Respiratory: clear  Abdomen: +Bs, soft  Musculoskeletal: no edema   Data Reviewed: Basic Metabolic Panel:  Recent Labs Lab 02/28/14 1035 02/28/14 1631 03/01/14 2300  NA 137  --  136  K 3.9  --  3.8  CL 104  --  104  CO2 27  --  28  GLUCOSE 141*  --  106*  BUN 13  --  12  CREATININE 0.93 0.79 0.88  CALCIUM 8.7  --  9.1   Liver Function Tests: No results for input(s): AST, ALT, ALKPHOS, BILITOT, PROT, ALBUMIN in the last 168 hours. No results for input(s): LIPASE, AMYLASE in the last 168 hours. No results for  input(s): AMMONIA in the last 168 hours. CBC:  Recent Labs Lab 02/28/14 1035 02/28/14 1631 03/01/14 0403 03/01/14 1030 03/01/14 2300  WBC 5.8 6.1 5.6  --  5.9  NEUTROABS 3.8  --   --   --   --   HGB 12.0* 11.6* 11.3*  --  11.5*  HCT 35.9* 34.3* 34.0*  --  35.0*  MCV 91.6 90.7 93.7  --  92.3  PLT PLATELET CLUMPING, SUGGEST RECOLLECTION OF SAMPLE IN CITRATE TUBE. 110* PLATELET CLUMPS NOTED ON SMEAR, UNABLE TO ESTIMATE 32* 151   Cardiac Enzymes:  Recent Labs Lab 02/28/14 1035 02/28/14 2202 03/01/14 0403 03/01/14 0918  TROPONINI <0.03 <0.03 <0.03 <0.03   BNP (last 3 results) No results for input(s): PROBNP in the last 8760 hours. CBG:  Recent Labs Lab 02/28/14 1044  GLUCAP 124*    Recent Results (from the past 240 hour(s))  Urine culture     Status: None   Collection Time: 02/28/14  1:26 PM  Result Value Ref Range Status   Specimen Description URINE, CLEAN CATCH  Final   Special Requests NONE  Final   Colony Count NO GROWTH Performed at [facility]   Final   Culture NO GROWTH Performed at [facility]   Final   Report Status 03/01/2014 FINAL  Final     Studies: Ct  Head Wo Contrast  02/28/2014   CLINICAL DATA:  Syncope.  EXAM: CT HEAD WITHOUT CONTRAST  TECHNIQUE: Contiguous axial images were obtained from the base of the skull through the vertex without intravenous contrast.  COMPARISON:  None.  FINDINGS: Bony calvarium appears intact. Mild diffuse cortical atrophy is noted. Mild chronic ischemic white matter disease is noted. No mass effect or midline shift is noted. Ventricular size is within normal limits. There is no evidence of mass lesion, hemorrhage or acute infarction.  IMPRESSION: Mild diffuse cortical atrophy. Mild chronic ischemic white matter disease. No acute intracranial abnormality seen.   Electronically Signed   By: Sabino Dick M.D.   On: 02/28/2014 11:39   Ct Angio Chest Pe W/cm &/or Wo Cm  03/01/2014   CLINICAL DATA:  Syncope  02/28/2014.  Elevated D-dimer.  EXAM: CT ANGIOGRAPHY CHEST WITH CONTRAST  TECHNIQUE: Multidetector CT imaging of the chest was performed using the standard protocol during bolus administration of intravenous contrast. Multiplanar CT image reconstructions and MIPs were obtained to evaluate the vascular anatomy.  CONTRAST:  100 mL OMNIPAQUE IOHEXOL 350 MG/ML SOLN  COMPARISON:  CT chest 06/30/2008.  PA and lateral chest 02/28/2014.  FINDINGS: A single small clot is seen at the bifurcation of a segmental pulmonary artery to the right lower lobe extending into the subsegmental branch. No other pulmonary embolus is identified. There is no evidence of right heart strain. Heart size is normal. Calcific aortic and coronary atherosclerosis is identified. No pleural or pericardial effusion. The lungs demonstrate only mild dependent atelectatic change.  Visualized upper abdomen is unremarkable. No lytic or sclerotic bony lesion is seen.  Review of the MIP images confirms the above findings.  IMPRESSION: The study is positive for pulmonary embolus with only a single, small embolus identified in a segmental and subsegmental branch the right lower lobe. Negative for right heart strain.  Critical Value/emergent results were called by telephone at the time of interpretation on 03/01/2014 at 12:43 pm to the patient's nurse, Annett Gula, who verbally acknowledged these results.   Electronically Signed   By: Inge Rise M.D.   On: 03/01/2014 12:47   Mr Jeri Cos KZ Contrast  03/01/2014   CLINICAL DATA:  Syncopal episode.  Weakness.  EXAM: MRI HEAD WITHOUT AND WITH CONTRAST  TECHNIQUE: Multiplanar, multiecho pulse sequences of the brain and surrounding structures were obtained without and with intravenous contrast.  CONTRAST:  54mL MULTIHANCE GADOBENATE DIMEGLUMINE 529 MG/ML IV SOLN  COMPARISON:  Head CT 02/28/2014  FINDINGS: Diffusion imaging does not show any acute or subacute infarction. Brain does not show accelerated atrophy.  The brainstem and cerebellum are normal. The cerebral hemispheres are normal except for a few minimal small vessel ischemic changes in the deep white matter, less than often seen in healthy individuals of this age. No cortical or large vessel territory infarction. No mass lesion, hemorrhage, hydrocephalus or extra-axial collection. After contrast administration, no abnormal enhancement occurs. No pituitary mass. No inflammatory sinus disease. No skull or skullbase lesion.  IMPRESSION: Normal study for a person of this age. Minimal small vessel change of the hemispheric white matter, less than often seen in healthy individuals.   Electronically Signed   By: Nelson Chimes M.D.   On: 03/01/2014 15:12    Scheduled Meds: . docusate sodium  100 mg Oral BID  . sodium chloride  3 mL Intravenous Q12H  . sodium chloride  3 mL Intravenous Q12H   Continuous Infusions: . argatroban 1.7 mcg/kg/min (03/02/14 1109)  Antibiotics Given (last 72 hours)    None      Active Problems:   BPH (benign prostatic hypertrophy)   Syncope and collapse   Bradycardia    Time spent: 25 min    Brexton Sofia  [facility] Pager [phone]. If 7PM-7AM, please contact night-coverage at [email or web address], password [facility] 03/02/2014, 11:15 AM  LOS: 2 days

## 2014-03-02 NOTE — Progress Notes (Signed)
ANTICOAGULATION CONSULT NOTE - Follow Up Consult  Pharmacy Consult for argatroban Indication: pulmonary embolus  Labs:  Recent Labs  02/28/14 1035 02/28/14 1631 02/28/14 2202 03/01/14 0403 03/01/14 0918 03/01/14 1030 03/01/14 1640 03/01/14 1820 03/01/14 2300  HGB 12.0* 11.6*  --  11.3*  --   --   --   --  11.5*  HCT 35.9* 34.3*  --  34.0*  --   --   --   --  35.0*  PLT PLATELET CLUMPING, SUGGEST RECOLLECTION OF SAMPLE IN CITRATE TUBE. 110*  --  PLATELET CLUMPS NOTED ON SMEAR, UNABLE TO ESTIMATE  --  32*  --   --  151  APTT  --   --   --   --   --   --  38* 42* 45*  CREATININE 0.93 0.79  --   --   --   --   --   --  0.88  TROPONINI <0.03  --  <0.03 <0.03 <0.03  --   --   --   --      Assessment: 74yo male remains subtherapeutic on argatroban for PE and possible HIT.  Goal of Therapy:  aPTT 50-90 seconds   Plan:  Will increase agratroban gtt by 20% to 1.1 mcg/kg/min and check PTT in 2hr.  Wynona Neat, PharmD, BCPS  03/02/2014,1:42 AM

## 2014-03-02 NOTE — Progress Notes (Signed)
*  PRELIMINARY RESULTS* Vascular Ultrasound Lower extremity venous duplex has been completed.  Preliminary findings: DVT noted in a small branch of the left popliteal vein. Also baker's cyst noted on the left. No DVT RLE.   Landry Mellow, RDMS, RVT  03/02/2014, 3:32 PM

## 2014-03-02 NOTE — Care Management Note (Signed)
    Page 1 of 1   03/03/2014     10:19:09 AM CARE MANAGEMENT NOTE 03/03/2014  Patient:  [name] [name], [name] [name]   Account Number:  [id number]  Date Initiated:  03/02/2014  Documentation initiated by:  GRAVES-BIGELOW,Joeseph [place]  Subjective/Objective Assessment:   Pt admitted for syncope and collapse. CTA positive for PE.     Action/Plan:   CM to monitor for dispsotion needs.   Anticipated DC Date:  03/04/2014   Anticipated DC Plan:  SKILLED NURSING FACILITY  In-house referral  Clinical Social Worker      DC Planning Services  CM consult      Choice offered to / List presented to:             Status of service:  Completed, signed off Medicare Important Message given?  YES (If response is "NO", the following Medicare IM given date fields will be blank) Date Medicare IM given:  03/03/2014 Medicare IM given by:  GRAVES-BIGELOW,Jemina Scahill Date Additional Medicare IM given:   Additional Medicare IM given by:    Discharge Disposition:  [place]  Per UR Regulation:  Reviewed for med. necessity/level of care/duration of stay  If discussed at [address] of Stay Meetings, dates discussed:    Comments:  03-03-14 [place], RN, BSN [phone] Benefits check in process for xarelto. 50.00 co pay and medication is availalbe. CM provided pt with the 30 day free card. CSW spoke with pt in regards to disposition andthe plan is to return to [facility]. No further needs from CM at this time.

## 2014-03-02 NOTE — Progress Notes (Signed)
Hematology note addendum: The peripheral smear was reviewed from the time when patient had a platelet count of 36,000 and it was found to be completely clumped and hence the platelet count was in error. The hematology lab will remove that value from the medical record. I am certain that patient does not have heparin-induced cytopenia and can go back to Lovenox injections with an outpatient regimen of either Coumadin or any of the newer oral anticoagulants. Will sign off Thanks Nicholas Lose MD

## 2014-03-02 NOTE — Progress Notes (Addendum)
ANTICOAGULATION CONSULT NOTE - Follow up  Pharmacy Consult for Argatroban Indication: pulmonary embolus; questionable HIT  Allergies  Allergen Reactions  . Heparin     Patient Measurements: Height: 6' (182.9 cm) Weight: 209 lb 6.4 oz (94.983 kg) IBW/kg (Calculated) : 77.6  Vital Signs: Temp: 98.2 F (36.8 C) (02/01 1412) Temp Source: Oral (02/01 1412) BP: 131/76 mmHg (02/01 1412) Pulse Rate: 78 (02/01 1412)  Labs:  Recent Labs  02/28/14 1035 02/28/14 1631 02/28/14 2202 03/01/14 0403 03/01/14 0918 03/01/14 1030  03/01/14 2300  03/02/14 0540 03/02/14 0903 03/02/14 1444  HGB 12.0* 11.6*  --  11.3*  --   --   --  11.5*  --   --   --   --   HCT 35.9* 34.3*  --  34.0*  --   --   --  35.0*  --   --   --   --   PLT PLATELET CLUMPING, SUGGEST RECOLLECTION OF SAMPLE IN CITRATE TUBE. 110*  --  PLATELET CLUMPS NOTED ON SMEAR, UNABLE TO ESTIMATE  --  PLATELET CLUMPS NOTED ON SMEAR, UNABLE TO ESTIMATE  --  151  --   --   --   --   APTT  --   --   --   --   --   --   < > 45*  < > 34 43* 49*  LABPROT  --   --   --   --   --   --   --   --   --   --   --  19.4*  INR  --   --   --   --   --   --   --   --   --   --   --  1.63*  CREATININE 0.93 0.79  --   --   --   --   --  0.88  --   --   --   --   TROPONINI <0.03  --  <0.03 <0.03 <0.03  --   --   --   --   --   --   --   < > = values in this interval not displayed.  Estimated Creatinine Clearance: 88.1 mL/min (by C-G formula based on Cr of 0.88).   Medical History: Past Medical History  Diagnosis Date  . BPH (benign prostatic hypertrophy)   . Osteoarthritis   . Lumbar disc disease     Medications:  Prescriptions prior to admission  Medication Sig Dispense Refill Last Dose  . aspirin 325 MG tablet Take 325 mg by mouth daily.   02/28/2014 at Unknown time  . docusate sodium (COLACE) 100 MG capsule Take 100 mg by mouth daily.   02/28/2014 at Unknown time  . enoxaparin (LOVENOX) 40 MG/0.4ML injection Inject 0.4 mLs (40 mg total)  into the skin daily. 7 Syringe 0 02/24/2014  . oxyCODONE (OXY IR/ROXICODONE) 5 MG immediate release tablet Take 1-2 tablets (5-10 mg total) by mouth every 4 (four) hours as needed for breakthrough pain ((for MODERATE breakthrough pain)). 60 tablet 0 02/27/2014 at Unknown time  . tamsulosin (FLOMAX) 0.4 MG CAPS capsule Take 2 capsules (0.8 mg total) by mouth daily.   02/28/2014 at Unknown time   Scheduled:  . docusate sodium  100 mg Oral BID  . sodium chloride  3 mL Intravenous Q12H  . sodium chloride  3 mL Intravenous Q12H    Assessment: APTT =49 seconds approximately 3 hours  after argratroban drip increased to 1.7 mcg/kg/min.  PTT is approaching therapeutic goal 50-90 seconds. Patient is on argatroban  for PE and possible HIT. No bleeding noted.  PLTC has increased to 151K today from 32K yesterday. Hematologist, Dr. Lindi Adie has just left progress note indicating that review of previous PLTC reported of 36,000 was found to be completely clumped and hence count was in error. MD notes that patient does not have heparin-induced thrombocytopenia and can go back to Lovenox injections with an outpatient regimen of either Coumadin or any of the newer oral anticoagulants.   Goal of Therapy:  aPTT 50-90 seconds Monitor platelets by anticoagulation protocol: Yes   Plan:  Increase Argatroban to 2.0 mcg/kg/min Check aPTT in 2 hours  Will f/u with Dr. Eliseo Squires if need to changed to lovenox as hematologist recommended.  Daily aPTT and CBC while on Argatroban.  Nicole Cella, RPh Clinical Pharmacist Pager: [phone] 03/02/2014 4:02 PM    PM Addendum  Per hematology pt does not have HIT.  Plan to start rivaroxaban tonight and turn off argatroban drip after dose given.  Plan Rivaroxaban 15mg  BID x21 days  Then 20mg  daily  Bonnita Nasuti Pharm.D. CPP, BCPS Clinical Pharmacist [phone] 03/02/2014 5:42 PM

## 2014-03-02 NOTE — Consult Note (Signed)
[place]  Telephone:(336) [place] NOTE  [name] [name]                                MR#: [id number]  DOB: [birth year]                       CSN#: [id number]  No care team member to display Referring MD: [facility]    Reason for Consult: Thrombocytopenia  [name] [name] 74 y.o. male admitted on 02/28/14 after recurrent syncopal episodes. The patient had been recently discharged from the hospital after undergoing left hip IM nailing on 12/24. Patient was found to be bradycardic for which he was admitted for further workup.  He was placed on Lovenox prophylaxis on admission. Of note, he had been on Lovenox as outpatient through 1/26 prior to this admission.He also received 1 dose of Lovenox on presentation on 1/30. CT of the head was normal, but CT angiogram taken after elevated D Dmer seen, was positive for right lobe PE (single embolus).  Argatroban infusion (mild range starting dose) was added by Pharmacy since 1/31.  While his CBC showed a platelet count of 110,000, this quickly dropped to 32,000 on 1/31. He was mildly anemic, with a Hb 12 and normal WBC. No platelet transfusion was received. His platelet count today is normal at 151,000. His INR is responding to med.  HIT panel is pending. Smear has been ordered for review. 2 D echo pending.  He denies recent bruising or acute bleeding, such as spontaneous epistaxis, hematuria, melena or hematochezia. The patient denies history of liver disease,risk factors for HIV. Denies any  history of cardiac murmur or prior cardiovascular surgery Denies recent new medications. No significant amount NSAIDs, but was on ASA 325 mg prior to admission. He denies prior blood or platelet transfusions. Patient never had a hematological evaluation prior to this admission .He never had a bone marrow biopsy. Denies any ticks or other insect bites. He has a long history of tobacco abuse.   We were kindly  asked to see the patient with recommendations  PMH:  Past Medical History  Diagnosis Date  . BPH (benign prostatic hypertrophy)   . Osteoarthritis   . Lumbar disc disease     Surgeries:  Past Surgical History  Procedure Laterality Date  . Lumbar laminectomy    . Intramedullary (im) nail intertrochanteric Left 01/22/2014    Procedure: INTRAMEDULLARY (IM) NAIL INTERTROCHANTRIC;  Surgeon: Melina Schools, MD;  Location: [facility];  Service: Orthopedics;  Laterality: Left;    Allergies:  Allergies  Allergen Reactions  . Heparin     Medications:    prior to admission:  Prescriptions prior to admission  Medication Sig Dispense Refill Last Dose  . aspirin 325 MG tablet Take 325 mg by mouth daily.   02/28/2014 at Unknown time  . docusate sodium (COLACE) 100 MG capsule Take 100 mg by mouth daily.   02/28/2014 at Unknown time  . enoxaparin (LOVENOX) 40 MG/0.4ML injection Inject 0.4 mLs (40 mg total) into the skin daily. 7 Syringe 0 02/24/2014  . oxyCODONE (OXY IR/ROXICODONE) 5 MG immediate release tablet Take 1-2 tablets (5-10 mg total) by mouth every 4 (four) hours as needed for breakthrough pain ((for MODERATE breakthrough pain)). 60 tablet 0 02/27/2014 at Unknown time  . tamsulosin (FLOMAX) 0.4 MG CAPS capsule Take 2 capsules (0.8 mg  total) by mouth daily.   02/28/2014 at Unknown time    . docusate sodium  100 mg Oral BID  . sodium chloride  3 mL Intravenous Q12H  . sodium chloride  3 mL Intravenous Q12H    FKC:LEXNTZ chloride, acetaminophen, ondansetron, oxyCODONE, sodium chloride  ROS: Constitutional: Denies fevers, chills or abnormal night sweats Eyes: Denies blurriness of vision, double vision or watery eyes Ears, nose, mouth, throat, and face: Denies mucositis or sore throat Respiratory: Denies cough, dyspnea or wheezes Cardiovascular: Positive for palpitations during admission as mentioned on HPI, Denies chest discomfort or lower extremity swelling Gastrointestinal:  Denies  nausea, heartburn or change in bowel habits Skin: Denies abnormal skin rashes Lymphatics: Denies new lymphadenopathy or easy bruising Neurological:Denies numbness, tingling or new weaknesses GU: some bladder hesitancy Musculoskeletal: Had left knee pain on admission, now improved.  Behavioral/Psych: Mood is stable, no new changes  All other systems were reviewed with the patient and are negative.    Family History:    History reviewed. No pertinent family history.  No family history of hematological  disorders.  Social History:  reports that he has been smoking Cigarettes.  He has been smoking about 1.50 packs per day. He has never used smokeless tobacco. He reports that he does not drink alcohol. His drug history is not on file. Lives at [facility]. Married. Retired Administrator. Denies ETOH. Smokes 1.5  ppd for 50 years.   Physical Exam    ECOG PERFORMANCE STATUS:2-3  Filed Vitals:   03/02/14 0500  BP:   Pulse:   Temp: 98 F (36.7 C)  Resp: 20   Filed Weights   02/28/14 1401 03/01/14 0549 03/02/14 0500  Weight: 206 lb 5.6 oz (93.6 kg) 205 lb 1.6 oz (93.033 kg) 209 lb 6.4 oz (94.983 kg)    GENERAL:alert, no distress and comfortable SKIN: skin color, texture, turgor are normal, no rashes or significant lesions EYES: normal, conjunctiva are pink and non-injected, sclera clear OROPHARYNX:no exudate, no erythema and lips, buccal mucosa, and tongue normal  NECK: supple, thyroid normal size, non-tender, without nodularity LYMPH:  no palpable lymphadenopathy in the cervical, axillary or inguinal area LUNGS: clear to auscultation and percussion with normal breathing effort HEART: regular rate & rhythm and no murmurs and no lower extremity edema ABDOMEN:abdomen soft, non-tender and normal bowel sounds Musculoskeletal:no cyanosis of digits and no clubbing  PSYCH: alert & oriented x 3 with fluent speech NEURO: no focal motor/sensory deficits   Labs:    Recent Labs Lab  02/28/14 1035 02/28/14 1631 03/01/14 0403 03/01/14 1030 03/01/14 2300  WBC 5.8 6.1 5.6  --  5.9  HGB 12.0* 11.6* 11.3*  --  11.5*  HCT 35.9* 34.3* 34.0*  --  35.0*  PLT PLATELET CLUMPING, SUGGEST RECOLLECTION OF SAMPLE IN CITRATE TUBE. 110* PLATELET CLUMPS NOTED ON SMEAR, UNABLE TO ESTIMATE 32* 151  MCV 91.6 90.7 93.7  --  92.3  MCH 30.6 30.7 31.1  --  30.3  MCHC 33.4 33.8 33.2  --  32.9  RDW 14.4 14.3 14.4  --  14.2  LYMPHSABS 1.3  --   --   --   --   MONOABS 0.5  --   --   --   --   EOSABS 0.2  --   --   --   --   BASOSABS 0.0  --   --   --   --         Recent Labs Lab 02/28/14 1035  02/28/14 1631 03/01/14 2300  NA 137  --  136  K 3.9  --  3.8  CL 104  --  104  CO2 27  --  28  GLUCOSE 141*  --  106*  BUN 13  --  12  CREATININE 0.93 0.79 0.88  CALCIUM 8.7  --  9.1        Component Value Date/Time   BILITOT 0.4 01/22/2014 1321     No results for input(s): INR, PROTIME in the last 168 hours.   Recent Labs  02/28/14 1631  DDIMER 1.93*     Anemia panel:  No results for input(s): VITAMINB12, FOLATE, FERRITIN, TIBC, IRON, RETICCTPCT in the last 72 hours.  Urinalysis    Component Value Date/Time   COLORURINE YELLOW 02/28/2014 1326   APPEARANCEUR CLEAR 02/28/2014 1326   LABSPEC 1.014 02/28/2014 1326   PHURINE 7.5 02/28/2014 1326   GLUCOSEU NEGATIVE 02/28/2014 1326   HGBUR NEGATIVE 02/28/2014 1326   BILIRUBINUR NEGATIVE 02/28/2014 1326   KETONESUR NEGATIVE 02/28/2014 1326   PROTEINUR NEGATIVE 02/28/2014 1326   UROBILINOGEN 0.2 02/28/2014 1326   NITRITE NEGATIVE 02/28/2014 1326   LEUKOCYTESUR NEGATIVE 02/28/2014 1326    Drugs of Abuse  No results found for: LABOPIA, COCAINSCRNUR, LABBENZ, AMPHETMU, THCU, LABBARB   Imaging Studies:  Dg Chest 2 View  02/28/2014   CLINICAL DATA:  74 year old male with syncope.  EXAM: CHEST - 2 VIEW  COMPARISON:  01/22/2014  FINDINGS: Cardiomediastinal silhouette unchanged in size and contour. Atherosclerotic  calcifications of the aortic arch again noted. No evidence of pulmonary vascular congestion.  No confluent airspace disease, pneumothorax, or pleural effusion.  Degenerative changes of the visualized spine.  No displaced fracture.  No displaced fracture.  IMPRESSION: No radiographic evidence of acute cardiopulmonary disease.  Atherosclerosis  Signed,  Dulcy Fanny. Earleen Newport, DO  [facility]  [facility]   Electronically Signed   By: Corrie Mckusick D.O.   On: 02/28/2014 11:14   Ct Head Wo Contrast  02/28/2014   CLINICAL DATA:  Syncope.  EXAM: CT HEAD WITHOUT CONTRAST  TECHNIQUE: Contiguous axial images were obtained from the base of the skull through the vertex without intravenous contrast.  COMPARISON:  None.  FINDINGS: Bony calvarium appears intact. Mild diffuse cortical atrophy is noted. Mild chronic ischemic white matter disease is noted. No mass effect or midline shift is noted. Ventricular size is within normal limits. There is no evidence of mass lesion, hemorrhage or acute infarction.  IMPRESSION: Mild diffuse cortical atrophy. Mild chronic ischemic white matter disease. No acute intracranial abnormality seen.   Electronically Signed   By: Sabino Dick M.D.   On: 02/28/2014 11:39   Ct Angio Chest Pe W/cm &/or Wo Cm  03/01/2014   CLINICAL DATA:  Syncope 02/28/2014.  Elevated D-dimer.  EXAM: CT ANGIOGRAPHY CHEST WITH CONTRAST  TECHNIQUE: Multidetector CT imaging of the chest was performed using the standard protocol during bolus administration of intravenous contrast. Multiplanar CT image reconstructions and MIPs were obtained to evaluate the vascular anatomy.  CONTRAST:  100 mL OMNIPAQUE IOHEXOL 350 MG/ML SOLN  COMPARISON:  CT chest 06/30/2008.  PA and lateral chest 02/28/2014.  FINDINGS: A single small clot is seen at the bifurcation of a segmental pulmonary artery to the right lower lobe extending into the subsegmental branch. No other pulmonary embolus is  identified. There is no evidence of right heart strain. Heart size is normal. Calcific aortic and coronary atherosclerosis is identified. No pleural or pericardial  effusion. The lungs demonstrate only mild dependent atelectatic change.  Visualized upper abdomen is unremarkable. No lytic or sclerotic bony lesion is seen.  Review of the MIP images confirms the above findings.  IMPRESSION: The study is positive for pulmonary embolus with only a single, small embolus identified in a segmental and subsegmental branch the right lower lobe. Negative for right heart strain.  Critical Value/emergent results were called by telephone at the time of interpretation on 03/01/2014 at 12:43 pm to the patient's nurse, Annett Gula, who verbally acknowledged these results.   Electronically Signed   By: Inge Rise M.D.   On: 03/01/2014 12:47   Mr Jeri Cos KC Contrast  03/01/2014   CLINICAL DATA:  Syncopal episode.  Weakness.  EXAM: MRI HEAD WITHOUT AND WITH CONTRAST  TECHNIQUE: Multiplanar, multiecho pulse sequences of the brain and surrounding structures were obtained without and with intravenous contrast.  CONTRAST:  80m MULTIHANCE GADOBENATE DIMEGLUMINE 529 MG/ML IV SOLN  COMPARISON:  Head CT 02/28/2014  FINDINGS: Diffusion imaging does not show any acute or subacute infarction. Brain does not show accelerated atrophy. The brainstem and cerebellum are normal. The cerebral hemispheres are normal except for a few minimal small vessel ischemic changes in the deep white matter, less than often seen in healthy individuals of this age. No cortical or large vessel territory infarction. No mass lesion, hemorrhage, hydrocephalus or extra-axial collection. After contrast administration, no abnormal enhancement occurs. No pituitary mass. No inflammatory sinus disease. No skull or skullbase lesion.  IMPRESSION: Normal study for a person of this age. Minimal small vessel change of the hemispheric white matter, less than often seen in  healthy individuals.   Electronically Signed   By: MNelson ChimesM.D.   On: 03/01/2014 15:12   Dg Femur Min 2 Views Left  03/02/2014   CLINICAL DATA:  Intra medullary nail placement in the left femur  EXAM: DG FEMUR 2+V*L*  COMPARISON:  01/23/2014  FINDINGS: There is a left intertrochanteric fracture transfixed with an intra medullary nail and cannulated femoral neck screw without failure or complication. There is medial displacement of the lesser trochanter. The intertrochanteric fracture cleft persists. There is generalized osteopenia. There is peripheral vascular atherosclerotic disease.  IMPRESSION: Ununited left intertrochanteric fracture transfixed with an intramedullary nail.   Electronically Signed   By: HKathreen Devoid  On: 03/02/2014 13:26     A/P: 74y.o. male with :   Thrombocytopenia: At this point the cause is unknown. No bleeding issues are reported.  Patient was on Lovenox until 1/26 following orthopedic surgical procedure. On admission, he continued to receive Lovenox CTA was positive for Right pulmonary embolus. Pharmacy initiated Argatroban, to continue until HIT panel results are available.  His platelet count, although low on admission, is now at 151,000 Please repeat platelet count from 03/01/14 to confirm the abnormal lab result, or if clumping was involved.  Consider transfusion of platelets it they are less than 10,000 or acutely bleeding. Peripheral blood smear to rule out schistocytes is pending.  If HIT positive,alternative anticoagulation with argatroban until the platelets rise to normal count may be used, to transition to coumadin until INR>3. If no thrombus, to treat for 6 weeks-3 months. If ITP, will order a Bone Marrow biopsy to rule out MDS or if refractory disease versus observation if asymptomatic and platelets are greater than 30k or less than 20k if bleeding.  Pulmonary Embolism CTA was positive for right lobe single embolus He is on anticoagulation by  Pharmacy  as stated above.  Full Code   Other medical issues as per admitting team and pertinent specialties.     Thank you for the referral.  **Disclaimer: This note was dictated with voice recognition software. Similar sounding words can inadvertently be transcribed and this note may contain transcription errors which may not have been corrected upon publication of note.Sharene Butters E, PA-C 03/02/2014 1:41 PM  Attending Note  I personally saw the patient, reviewed the chart and examined the patient. The plan of care was discussed with the patient. I agree with the assessment and plan as documented above. Thank you very much for the consultation. Pertinent findings are summarized as follows  1. Transient thrombocytopenia: I do not believe this is heparin-induced. I suspect this was related to platelet clumping. I asked the lab to prepare a blood smear for [place] to review. The peripheral smear done on 02/28/2014 showed platelet clumping as well. If the blood smear on the sample of blood that showed a platelet count of 32,000 confirms clumping then he can resume anticoagulation with Lovenox. Until then continue with Argatroban drip.  2. Right lower lobe pulmonary embolism: Secondary to recent surgery and immobility. I also discussed with him that he needs to quit smoking to lower his risk of blood clots. I recommend anticoagulation for 3-6 months or a case as long as he remains physically unable to function normally. I do not recommend sending hypercoagulability panel.  Patient stated that he thinks that he will need a knee surgery. If that's the case, we can bridge him with Lovenox again and get him to undergo any procedures.  Thanks again for the consultation.

## 2014-03-03 DIAGNOSIS — R55 Syncope and collapse: Secondary | ICD-10-CM | POA: Diagnosis not present

## 2014-03-03 DIAGNOSIS — I2699 Other pulmonary embolism without acute cor pulmonale: Secondary | ICD-10-CM | POA: Diagnosis not present

## 2014-03-03 DIAGNOSIS — S72002S Fracture of unspecified part of neck of left femur, sequela: Secondary | ICD-10-CM | POA: Diagnosis not present

## 2014-03-03 DIAGNOSIS — N400 Enlarged prostate without lower urinary tract symptoms: Secondary | ICD-10-CM | POA: Diagnosis not present

## 2014-03-03 DIAGNOSIS — I82409 Acute embolism and thrombosis of unspecified deep veins of unspecified lower extremity: Secondary | ICD-10-CM | POA: Diagnosis not present

## 2014-03-03 DIAGNOSIS — S72146A Nondisplaced intertrochanteric fracture of unspecified femur, initial encounter for closed fracture: Secondary | ICD-10-CM | POA: Diagnosis not present

## 2014-03-03 DIAGNOSIS — K5900 Constipation, unspecified: Secondary | ICD-10-CM | POA: Diagnosis not present

## 2014-03-03 DIAGNOSIS — R001 Bradycardia, unspecified: Secondary | ICD-10-CM | POA: Diagnosis not present

## 2014-03-03 DIAGNOSIS — N401 Enlarged prostate with lower urinary tract symptoms: Secondary | ICD-10-CM | POA: Diagnosis not present

## 2014-03-03 DIAGNOSIS — R339 Retention of urine, unspecified: Secondary | ICD-10-CM | POA: Diagnosis not present

## 2014-03-03 DIAGNOSIS — I82402 Acute embolism and thrombosis of unspecified deep veins of left lower extremity: Secondary | ICD-10-CM | POA: Diagnosis not present

## 2014-03-03 LAB — BASIC METABOLIC PANEL
Anion gap: 9 (ref 5–15)
BUN: 10 mg/dL (ref 6–23)
CO2: 25 mmol/L (ref 19–32)
Calcium: 8.9 mg/dL (ref 8.4–10.5)
Chloride: 104 mmol/L (ref 96–112)
Creatinine, Ser: 0.88 mg/dL (ref 0.50–1.35)
GFR calc Af Amer: >90 mL/min (ref >90)
GFR calc non Af Amer: 83 mL/min — ABNORMAL LOW (ref >90)
Glucose, Bld: 102 mg/dL — ABNORMAL HIGH (ref 70–99)
Potassium: 4 mmol/L (ref 3.5–5.1)
Sodium: 138 mmol/L (ref 135–145)

## 2014-03-03 LAB — HEPARIN INDUCED THROMBOCYTOPENIA PNL
Heparin Induced Plt Ab: NEGATIVE
PATIENT O. D.: 0.259
UFH High Dose UFH H: 0 % Release
UFH LOW DOSE 0.1 IU/ML: 0 %
UFH LOW DOSE 0.5 IU/ML: 0 %
UFH SRA Result: NEGATIVE

## 2014-03-03 LAB — CBC
HCT: 36.4 % — ABNORMAL LOW (ref 39.0–52.0)
HCT: 35.9 % — ABNORMAL LOW (ref 39.0–52.0)
Hemoglobin: 12.2 g/dL — ABNORMAL LOW (ref 13.0–17.0)
Hemoglobin: 12.2 g/dL — ABNORMAL LOW (ref 13.0–17.0)
MCH: 31.1 pg (ref 26.0–34.0)
MCH: 30.6 pg (ref 26.0–34.0)
MCHC: 33.5 g/dL (ref 30.0–36.0)
MCHC: 34 g/dL (ref 30.0–36.0)
MCV: 92.9 fL (ref 78.0–100.0)
MCV: 90 fL (ref 78.0–100.0)
Platelets: UNDETERMINED 10*3/uL (ref 150–400)
Platelets: UNDETERMINED 10*3/uL (ref 150–400)
RBC: 3.92 MIL/uL — AB (ref 4.22–5.81)
RBC: 3.99 MIL/uL — AB (ref 4.22–5.81)
RDW: 14.2 % (ref 11.5–15.5)
RDW: 13.9 % (ref 11.5–15.5)
WBC: 5.5 10*3/uL (ref 4.0–10.5)
WBC: 6.5 10*3/uL (ref 4.0–10.5)

## 2014-03-03 MED ORDER — OXYCODONE HCL 5 MG PO TABS: 5.0000 mg | ORAL_TABLET | ORAL | Status: DC | PRN

## 2014-03-03 MED ORDER — RIVAROXABAN 15 MG PO TABS: 15.0000 mg | ORAL_TABLET | Freq: Two times a day (BID) | ORAL | Status: DC

## 2014-03-03 MED ORDER — RIVAROXABAN 20 MG PO TABS: 20.0000 mg | ORAL_TABLET | Freq: Every day | ORAL | Status: DC

## 2014-03-03 MED ORDER — ACETAMINOPHEN 325 MG PO TABS: 650.0000 mg | ORAL_TABLET | Freq: Four times a day (QID) | ORAL | Status: DC | PRN

## 2014-03-03 NOTE — Progress Notes (Signed)
CSW (Clinical Education officer, museum) prepared pt dc packet and placed with shadow chart. CSW arranged non-emergent ambulance transport. Pt, pt nurse, and facility informed. Pt informed CSW he would notify his wife and friends of discharge himself. CSW signing off.  [place], [place]

## 2014-03-03 NOTE — Clinical Social Work Psychosocial (Signed)
     Clinical Social Work Department BRIEF PSYCHOSOCIAL ASSESSMENT 03/03/2014  Patient:  [name] [name], [name] [name]     Account Number:  [id number]     Admit date:  02/28/2014  Clinical Social Worker:  Adair Laundry  Date/Time:  03/03/2014 10:48 AM  Referred by:  Physician  Date Referred:  03/03/2014 Referred for  SNF Placement   Other Referral:   Interview type:  Patient Other interview type:    PSYCHOSOCIAL DATA Living Status:  FACILITY Admitted from facility:  [place], [place] Level of care:  [place] Primary support name:  [name] [name] Primary support relationship to patient:  SPOUSE Degree of support available:   Pt has good support system    CURRENT CONCERNS Current Concerns  Post-Acute Placement   Other Concerns:    SOCIAL WORK ASSESSMENT / PLAN CSW visited pt room to discuss returning to SNF vs discharging home. Pt informed CSW he would like to return to SNF for a few more days. Pt would like to be able to safely get things in order for him to return home. Pt working on finding safe home transportation among other things. CSW notified pt that CSW would contact facility and work on discharge back today. Pt agreeable to this plan and requesting non-emergent ambulance transport to facility.   Assessment/plan status:  Psychosocial Support/Ongoing Assessment of Needs Other assessment/ plan:   Information/referral to community resources:    PATIENTS/FAMILYS RESPONSE TO PLAN OF CARE: Pt wanting to return to SNF before going home.    [place], [place]

## 2014-03-03 NOTE — Progress Notes (Signed)
Subjective:  No recurrent syncope.  No bradycardia noted.  Evidently no evidence of HIT   Objective:  Vital Signs in the last 24 hours: BP 130/71 mmHg  Pulse 71  Temp(Src) 98.1 F (36.7 C) (Oral)  Resp 20  Ht 6' (1.829 m)  Wt 91.853 kg (202 lb 8 oz)  BMI 27.46 kg/m2  SpO2 97% Orthostatic VS for the past 24 hrs:  BP- Lying Pulse- Lying BP- Sitting Pulse- Sitting BP- Standing at 0 minutes Pulse- Standing at 0 minutes  03/03/14 0549 157/88 mmHg 86 (!) 159/93 mmHg 91 109/67 mmHg 104     Physical Exam: Pleasant black male in no acute distress Lungs:  Clear Cardiac:  Regular rhythm, normal S1 and S2, no S3 Extremities:  Trace edema present  Intake/Output from previous day: 02/01 0701 - 02/02 0700 In: 960 [P.O.:960] Out: 1100 [Urine:1100]  Weight Filed Weights   03/01/14 0549 03/02/14 0500 03/03/14 0549  Weight: 93.033 kg (205 lb 1.6 oz) 94.983 kg (209 lb 6.4 oz) 91.853 kg (202 lb 8 oz)    Lab Results: Basic Metabolic Panel:  Recent Labs  03/01/14 2300 03/03/14 0318  NA 136 138  K 3.8 4.0  CL 104 104  CO2 28 25  GLUCOSE 106* 102*  BUN 12 10  CREATININE 0.88 0.88   CBC:  Recent Labs  02/28/14 1035  03/01/14 2300 03/03/14 0318  WBC 5.8  < > 5.9 5.5  NEUTROABS 3.8  --   --   --   HGB 12.0*  < > 11.5* 12.2*  HCT 35.9*  < > 35.0* 36.4*  MCV 91.6  < > 92.3 92.9  PLT PLATELET CLUMPING, SUGGEST RECOLLECTION OF SAMPLE IN CITRATE TUBE.  < > 151 PLATELET CLUMPS NOTED ON SMEAR, UNABLE TO ESTIMATE  < > = values in this interval not displayed. Cardiac Enzymes:  Recent Labs  02/28/14 2202 03/01/14 0403 03/01/14 0918  TROPONINI <0.03 <0.03 <0.03    Telemetry: Sinus rhythm, no bradycardia noted.  Assessment/Plan:  1.  Syncope that may have been vasovagal but pulmonary embolus now noted and could have also been cause.   2.  Atherosclerotic calcification noted on chest x-ray 3.  Recent hip surgery 4.  BPH with a high dose of Flomax that could be contributing  to orthostasis.  Flomax has been discontinued.    Recommendations:  He was changed to [facility] for treatment of pulmonary embolus.  He is still mildly orthostatic.  This may reflect Flomax.  He might need to wear support hose.   Kerry Hough  MD [facility]  03/03/2014, 8:23 AM

## 2014-03-03 NOTE — Discharge Summary (Signed)
Physician Discharge Summary  [name] [name] JJO:[id number] DOB: Jan 22, 1941 DOA: 02/28/2014  PCP: No primary care provider on file.  Admit date: 02/28/2014 Discharge date: 03/03/2014  Time spent: 35 minutes  Recommendations for Outpatient Follow-up:  1. SNF 2. xarelto 15 mg BID x 43 doses then 20 mg daily 3. TED hose- monitor orthostatics 4. Cbc 1 week  Discharge Diagnoses:  Active Problems:   BPH (benign prostatic hypertrophy)   Syncope and collapse   Bradycardia   Discharge Condition: improved  Diet recommendation: heart healthy  Filed Weights   03/01/14 0549 03/02/14 0500 03/03/14 0549  Weight: 93.033 kg (205 lb 1.6 oz) 94.983 kg (209 lb 6.4 oz) 91.853 kg (202 lb 8 oz)    History of present illness:  [name] [name] is a 74 y.o. male, with past medical history of BPH, tobacco abuse, and recent left hip fracture requiring IM nailing on 12/24. He presents to the ER today after 2 syncopal episodes this morning. He was at the kitchen table reading his mail when he felt diaphoretic and dizzy. He lost consciousness for 3-4 minutes during which he had urinary incontinence (but no tongue biting). When he awoke his wife noticed he was confused for a short while. EMS witnessed a second syncopal  episode that lasted for 3-4 minutes as well. He was bradycardic in the 40s and had pvcs. [name] has had no history of heart disease, chest pain or palpitations. On exam he did have numbness in his lower extremities.   Hospital Course:  2 syncopal episodes 1/60 am. Uncertain etiology- orthostatic vs PE  EEG, MRI ok,  +PE on CTA -duplex LE + for DVT TSH ok, Troponin ok. Hold tamsulosin for now -monitor orthostatics at SNF.  Hypotensive (86/41) Responded in the ER to IVF. Holding flomax.  Bradycardia. Uncertain etiology. Not on beta blockers. Appreciate cardiology evaluation.  Hx of BPH and UTI Holding Flomax. U/A is negative for infection.  Thrombocytopenia Appears to  be pseudo due to clumping Heme onc eval- no sign of HIT  +PE on CTA -xarelto   Procedures:  echo  Consultations:  Cards  Heme onc  Discharge Exam: Filed Vitals:   03/03/14 0549  BP:   Pulse:   Temp: 98.1 F (36.7 C)  Resp: 20    General: A+Ox3, NAD Cardiovascular: *rrr Respiratory: clear  Discharge Instructions    Current Discharge Medication List    CONTINUE these medications which have NOT CHANGED   Details  aspirin 325 MG tablet Take 325 mg by mouth daily.    docusate sodium (COLACE) 100 MG capsule Take 100 mg by mouth daily.    enoxaparin (LOVENOX) 40 MG/0.4ML injection Inject 0.4 mLs (40 mg total) into the skin daily. Qty: 7 Syringe, Refills: 0    oxyCODONE (OXY IR/ROXICODONE) 5 MG immediate release tablet Take 1-2 tablets (5-10 mg total) by mouth every 4 (four) hours as needed for breakthrough pain ((for MODERATE breakthrough pain)). Qty: 60 tablet, Refills: 0    tamsulosin (FLOMAX) 0.4 MG CAPS capsule Take 2 capsules (0.8 mg total) by mouth daily.       No Known Allergies    The results of significant diagnostics from this hospitalization (including imaging, microbiology, ancillary and laboratory) are listed below for reference.    Significant Diagnostic Studies: Dg Chest 2 View  02/28/2014   CLINICAL DATA:  74 year old male with syncope.  EXAM: CHEST - 2 VIEW  COMPARISON:  01/22/2014  FINDINGS: Cardiomediastinal silhouette unchanged in size and contour. Atherosclerotic calcifications  of the aortic arch again noted. No evidence of pulmonary vascular congestion.  No confluent airspace disease, pneumothorax, or pleural effusion.  Degenerative changes of the visualized spine.  No displaced fracture.  No displaced fracture.  IMPRESSION: No radiographic evidence of acute cardiopulmonary disease.  Atherosclerosis  Signed,  Dulcy Fanny. Earleen Newport, DO  [facility]  [facility]   Electronically Signed   By: Corrie Mckusick D.O.   On: 02/28/2014 11:14   Ct Head Wo Contrast  02/28/2014   CLINICAL DATA:  Syncope.  EXAM: CT HEAD WITHOUT CONTRAST  TECHNIQUE: Contiguous axial images were obtained from the base of the skull through the vertex without intravenous contrast.  COMPARISON:  None.  FINDINGS: Bony calvarium appears intact. Mild diffuse cortical atrophy is noted. Mild chronic ischemic white matter disease is noted. No mass effect or midline shift is noted. Ventricular size is within normal limits. There is no evidence of mass lesion, hemorrhage or acute infarction.  IMPRESSION: Mild diffuse cortical atrophy. Mild chronic ischemic white matter disease. No acute intracranial abnormality seen.   Electronically Signed   By: Sabino Dick M.D.   On: 02/28/2014 11:39   Ct Angio Chest Pe W/cm &/or Wo Cm  03/01/2014   CLINICAL DATA:  Syncope 02/28/2014.  Elevated D-dimer.  EXAM: CT ANGIOGRAPHY CHEST WITH CONTRAST  TECHNIQUE: Multidetector CT imaging of the chest was performed using the standard protocol during bolus administration of intravenous contrast. Multiplanar CT image reconstructions and MIPs were obtained to evaluate the vascular anatomy.  CONTRAST:  100 mL OMNIPAQUE IOHEXOL 350 MG/ML SOLN  COMPARISON:  CT chest 06/30/2008.  PA and lateral chest 02/28/2014.  FINDINGS: A single small clot is seen at the bifurcation of a segmental pulmonary artery to the right lower lobe extending into the subsegmental branch. No other pulmonary embolus is identified. There is no evidence of right heart strain. Heart size is normal. Calcific aortic and coronary atherosclerosis is identified. No pleural or pericardial effusion. The lungs demonstrate only mild dependent atelectatic change.  Visualized upper abdomen is unremarkable. No lytic or sclerotic bony lesion is seen.  Review of the MIP images confirms the above findings.  IMPRESSION: The study is positive for pulmonary embolus with only a single, small embolus identified in a  segmental and subsegmental branch the right lower lobe. Negative for right heart strain.  Critical Value/emergent results were called by telephone at the time of interpretation on 03/01/2014 at 12:43 pm to the patient's nurse, Annett Gula, who verbally acknowledged these results.   Electronically Signed   By: Inge Rise M.D.   On: 03/01/2014 12:47   Mr Jeri Cos UX Contrast  03/01/2014   CLINICAL DATA:  Syncopal episode.  Weakness.  EXAM: MRI HEAD WITHOUT AND WITH CONTRAST  TECHNIQUE: Multiplanar, multiecho pulse sequences of the brain and surrounding structures were obtained without and with intravenous contrast.  CONTRAST:  48mL MULTIHANCE GADOBENATE DIMEGLUMINE 529 MG/ML IV SOLN  COMPARISON:  Head CT 02/28/2014  FINDINGS: Diffusion imaging does not show any acute or subacute infarction. Brain does not show accelerated atrophy. The brainstem and cerebellum are normal. The cerebral hemispheres are normal except for a few minimal small vessel ischemic changes in the deep white matter, less than often seen in healthy individuals of this age. No cortical or large vessel territory infarction. No mass lesion, hemorrhage, hydrocephalus or extra-axial collection. After contrast administration, no abnormal enhancement occurs. No pituitary mass. No inflammatory sinus disease. No skull or skullbase lesion.  IMPRESSION: Normal study for a person of this age. Minimal small vessel change of the hemispheric white matter, less than often seen in healthy individuals.   Electronically Signed   By: Nelson Chimes M.D.   On: 03/01/2014 15:12   Dg Femur Min 2 Views Left  03/02/2014   CLINICAL DATA:  Intra medullary nail placement in the left femur  EXAM: DG FEMUR 2+V*L*  COMPARISON:  01/23/2014  FINDINGS: There is a left intertrochanteric fracture transfixed with an intra medullary nail and cannulated femoral neck screw without failure or complication. There is medial displacement of the lesser trochanter. The intertrochanteric  fracture cleft persists. There is generalized osteopenia. There is peripheral vascular atherosclerotic disease.  IMPRESSION: Ununited left intertrochanteric fracture transfixed with an intramedullary nail.   Electronically Signed   By: Kathreen Devoid   On: 03/02/2014 13:26    Microbiology: Recent Results (from the past 240 hour(s))  Urine culture     Status: None   Collection Time: 02/28/14  1:26 PM  Result Value Ref Range Status   Specimen Description URINE, CLEAN CATCH  Final   Special Requests NONE  Final   Colony Count NO GROWTH Performed at [facility]   Final   Culture NO GROWTH Performed at [facility]   Final   Report Status 03/01/2014 FINAL  Final     Labs: Basic Metabolic Panel:  Recent Labs Lab 02/28/14 1035 02/28/14 1631 03/01/14 2300 03/03/14 0318  NA 137  --  136 138  K 3.9  --  3.8 4.0  CL 104  --  104 104  CO2 27  --  28 25  GLUCOSE 141*  --  106* 102*  BUN 13  --  12 10  CREATININE 0.93 0.79 0.88 0.88  CALCIUM 8.7  --  9.1 8.9   Liver Function Tests: No results for input(s): AST, ALT, ALKPHOS, BILITOT, PROT, ALBUMIN in the last 168 hours. No results for input(s): LIPASE, AMYLASE in the last 168 hours. No results for input(s): AMMONIA in the last 168 hours. CBC:  Recent Labs Lab 02/28/14 1035 02/28/14 1631 03/01/14 0403 03/01/14 1030 03/01/14 2300 03/03/14 0318  WBC 5.8 6.1 5.6  --  5.9 5.5  NEUTROABS 3.8  --   --   --   --   --   HGB 12.0* 11.6* 11.3*  --  11.5* 12.2*  HCT 35.9* 34.3* 34.0*  --  35.0* 36.4*  MCV 91.6 90.7 93.7  --  92.3 92.9  PLT PLATELET CLUMPING, SUGGEST RECOLLECTION OF SAMPLE IN CITRATE TUBE. 110* PLATELET CLUMPS NOTED ON SMEAR, UNABLE TO ESTIMATE PLATELET CLUMPS NOTED ON SMEAR, UNABLE TO ESTIMATE 151 PLATELET CLUMPS NOTED ON SMEAR, UNABLE TO ESTIMATE   Cardiac Enzymes:  Recent Labs Lab 02/28/14 1035 02/28/14 2202 03/01/14 0403 03/01/14 0918  TROPONINI <0.03 <0.03 <0.03 <0.03   BNP: BNP (last 3  results) No results for input(s): BNP in the last 8760 hours.  ProBNP (last 3 results) No results for input(s): PROBNP in the last 8760 hours.  CBG:  Recent Labs Lab 02/28/14 1044  GLUCAP 124*       Signed:  Carmel Garfield  [facility] 03/03/2014, 10:23 AM

## 2014-03-03 NOTE — Progress Notes (Addendum)
Physical Therapy Treatment Patient Details Name: [name] [name] MRN: [id number] DOB: [birth year] Today's Date: 03/03/2014    History of Present Illness [name] [name]  is a 74 y.o. male, with past medical history of BPH, tobacco abuse, and recent left hip fracture requiring IM nailing on 12/24. He presents to the ER today after 2 syncopal episodes this morning.    PT Comments    Pt made excellent progress today with mobility. Pt's limiting factor is his left knee pain with WB. Pt is able to safely ambulate with a RW mod. Independently on the unit, but requires Supervision on uneven surfaces or for long distance ambulation. Pt was able to achieve 90* flexion in left knee, but with some discomfort. Pt completed step training with min assist. Pt is requesting d/c to short term SNF for continued rehab due to knee and hip pain limiting mobility. Pt has a blood clot and is on blood thinners. Pt is a fall risk if he does not have his RW or has increased knee pain and inability to WB on left side, therefore; I recommend SNF placement. Pt would benefit from continued PT to maximize mobility and independence for next venue of care.   Follow Up Recommendations  SNF     Equipment Recommendations  None recommended by PT    Recommendations for Other Services       Precautions / Restrictions Precautions Precautions: None Restrictions Weight Bearing Restrictions: No    Mobility  Bed Mobility Overal bed mobility: Modified Independent                Transfers Overall transfer level: Modified independent Equipment used: Rolling walker (2 wheeled)                Ambulation/Gait Ambulation/Gait assistance: Modified independent (Device/Increase time) Ambulation Distance (Feet): 200 Feet Assistive device: Rolling walker (2 wheeled) Gait Pattern/deviations: Step-through pattern;Decreased weight shift to left Gait velocity: decreased   General Gait Details: distance limited by pain left  hip and knee, unable to put full wt through LLE. Pt with limited left hip and knee extension to lacking proper heel strike on left and flexing at trunk in stance phase. Worked on this during ambulation but pt reports that he ambulates this way due to pain and is having a hard time correcting.   Stairs Stairs: Yes Stairs assistance: Min assist Stair Management: Step to pattern;Forwards;One rail Left Number of Stairs: 10 General stair comments: multiple trials of steps to determine the safest technique. Pt was limited by left knee pain during wb on left side.   Wheelchair Mobility    Modified Rankin (Stroke Patients Only)       Balance Overall balance assessment: Needs assistance         Standing balance support: Bilateral upper extremity supported Standing balance-Leahy Scale: Good                      Cognition Arousal/Alertness: Awake/alert Behavior During Therapy: WFL for tasks assessed/performed Overall Cognitive Status: Within Functional Limits for tasks assessed                      Exercises      General Comments        Pertinent Vitals/Pain Pain Assessment: 0-10 Pain Score: 5  Pain Location: left knee Pain Descriptors / Indicators: Aching Pain Intervention(s): Limited activity within patient's tolerance    Home Living  Prior Function            PT Goals (current goals can now be found in the care plan section) Progress towards PT goals: Progressing toward goals    Frequency       PT Plan Discharge plan needs to be updated    Co-evaluation             End of Session Equipment Utilized During Treatment: Gait belt Activity Tolerance: Patient tolerated treatment well Patient left: in bed     Time: 4356-8616 PT Time Calculation (min) (ACUTE ONLY): 33 min  Charges:  $Gait Training: 8-22 mins $Therapeutic Activity: 8-22 mins                    G Codes:      Lelon Mast 03/03/2014, 8:57 AM

## 2014-03-04 ENCOUNTER — Non-Acute Institutional Stay (SKILLED_NURSING_FACILITY): Payer: 59 | Admitting: Adult Health

## 2014-03-04 DIAGNOSIS — S72002S Fracture of unspecified part of neck of left femur, sequela: Secondary | ICD-10-CM

## 2014-03-04 DIAGNOSIS — N400 Enlarged prostate without lower urinary tract symptoms: Secondary | ICD-10-CM | POA: Diagnosis not present

## 2014-03-04 DIAGNOSIS — I2699 Other pulmonary embolism without acute cor pulmonale: Secondary | ICD-10-CM | POA: Diagnosis not present

## 2014-03-04 DIAGNOSIS — K5900 Constipation, unspecified: Secondary | ICD-10-CM

## 2014-03-04 DIAGNOSIS — I82402 Acute embolism and thrombosis of unspecified deep veins of left lower extremity: Secondary | ICD-10-CM | POA: Diagnosis not present

## 2014-03-07 ENCOUNTER — Other Ambulatory Visit: Payer: Self-pay | Admitting: Internal Medicine

## 2014-03-07 MED ORDER — RIVAROXABAN 15 MG PO TABS
15.0000 mg | ORAL_TABLET | Freq: Two times a day (BID) | ORAL | Status: DC
Start: 2014-03-07 — End: 2014-11-19

## 2014-03-07 MED ORDER — RIVAROXABAN 20 MG PO TABS: ORAL_TABLET | ORAL | Status: DC

## 2014-03-08 ENCOUNTER — Non-Acute Institutional Stay (SKILLED_NURSING_FACILITY): Payer: 59 | Admitting: Internal Medicine

## 2014-03-08 ENCOUNTER — Encounter: Payer: Self-pay | Admitting: Internal Medicine

## 2014-03-08 DIAGNOSIS — S72002D Fracture of unspecified part of neck of left femur, subsequent encounter for closed fracture with routine healing: Secondary | ICD-10-CM

## 2014-03-08 DIAGNOSIS — N400 Enlarged prostate without lower urinary tract symptoms: Secondary | ICD-10-CM

## 2014-03-08 DIAGNOSIS — I82402 Acute embolism and thrombosis of unspecified deep veins of left lower extremity: Secondary | ICD-10-CM

## 2014-03-08 DIAGNOSIS — I2699 Other pulmonary embolism without acute cor pulmonale: Secondary | ICD-10-CM

## 2014-03-08 NOTE — Progress Notes (Signed)
Patient ID: [name] [name], male   DOB: Feb 18, 1940, 74 y.o.   MRN: [id number]    Facility  [facility]       Place of Service:   SNF   No Known Allergies  Chief Complaint  Patient presents with  . Discharge Note    RLL PE, poss LLE DVT, syncopal episode and collapse, bradycardia, s/p left intertrochanteric IM nail placemt due to fx, BPH    HPI:  74 yo male seen today at SNF for above. He actually was taken to the ED earlier this week after collapsing from syncopal episode. He was dx with a PE and started on xeralto. He has been tolerating therapy and will be d/c'd home with PT/OT via  home health. He will need a walker, reacher, sock aid, shoe horn and a walker bag. Already had Rx for xeralto at home. No CP and SOB improved. He has left groin pain. No palpitations or new swelling. Pain is controlled. He is excited about going home. No nursing issues.   Medications: Patient's Medications  New Prescriptions   No medications on file  Previous Medications   ACETAMINOPHEN (TYLENOL) 325 MG TABLET    Take 2 tablets (650 mg total) by mouth every 6 (six) hours as needed for moderate pain (headache).   DOCUSATE SODIUM (COLACE) 100 MG CAPSULE    Take 100 mg by mouth daily.   OXYCODONE (OXY IR/ROXICODONE) 5 MG IMMEDIATE RELEASE TABLET    Take 1-2 tablets (5-10 mg total) by mouth every 4 (four) hours as needed for breakthrough pain ((for MODERATE breakthrough pain)).   RIVAROXABAN (XARELTO) 15 MG TABS TABLET    Take 1 tablet (15 mg total) by mouth 2 (two) times daily with a meal. For 35 more doses   RIVAROXABAN (XARELTO) 20 MG TABS TABLET    First dose on Tue 03/24/14 at 1700   TAMSULOSIN (FLOMAX) 0.4 MG CAPS CAPSULE    Take 0.4 mg by mouth daily.  Modified Medications   No medications on file  Discontinued Medications   No medications on file     Review of Systems  As aove. All other systems reviewed are negative  Filed Vitals:   03/05/14 0007  BP: 133/77  Pulse: 86  Temp:  98.1 F (36.7 C)  Weight: 204 lb (92.534 kg)  SpO2: 96%   Body mass index is 27.66 kg/(m^2).  Physical Exam CONSTITUTIONAL: Looks well in NAD. Awake, alert and oriented x 3 HEENT: PERRLA. Oropharynx clear and without exudate NECK: Supple. Nontender. No palpable cervical or supraclavicular lymph nodes. No carotid bruit b/l. No thyromegaly or thyroid mass palpable.  CVS: Regular rate without murmur, gallop or rub. LUNGS: CTA b/l no wheezing, rales or rhonchi. ABDOMEN: Bowel sounds present x 4. Soft, nontender, nondistended. No palpable mass or bruit EXTREMITIES: trace LLE edema. Distal pulses palpable. No calf tenderness. No RLE edema. TED stockings intact PSYCH: Affect, behavior and mood normal   Labs reviewed: Admission on 02/28/2014, Discharged on 03/03/2014  Component Date Value Ref Range Status  . WBC 02/28/2014 5.8  4.0 - 10.5 K/uL Final  . RBC 02/28/2014 3.92* 4.22 - 5.81 MIL/uL Final  . Hemoglobin 02/28/2014 12.0* 13.0 - 17.0 g/dL Final  . HCT 02/28/2014 35.9* 39.0 - 52.0 % Final  . MCV 02/28/2014 91.6  78.0 - 100.0 fL Final  . MCH 02/28/2014 30.6  26.0 - 34.0 pg Final  . MCHC 02/28/2014 33.4  30.0 - 36.0 g/dL Final  . RDW 02/28/2014 14.4  11.5 - 15.5 % Final  . Platelets 02/28/2014 PLATELET CLUMPING, SUGGEST RECOLLECTION OF SAMPLE IN CITRATE TUBE.  150 - 400 K/uL Final   PLATELET CLUMPS NOTED ON SMEAR, UNABLE TO ESTIMATE  . Neutrophils Relative % 02/28/2014 65  43 - 77 % Final  . Neutro Abs 02/28/2014 3.8  1.7 - 7.7 K/uL Final  . Lymphocytes Relative 02/28/2014 23  12 - 46 % Final  . Lymphs Abs 02/28/2014 1.3  0.7 - 4.0 K/uL Final  . Monocytes Relative 02/28/2014 9  3 - 12 % Final  . Monocytes Absolute 02/28/2014 0.5  0.1 - 1.0 K/uL Final  . Eosinophils Relative 02/28/2014 3  0 - 5 % Final  . Eosinophils Absolute 02/28/2014 0.2  0.0 - 0.7 K/uL Final  . Basophils Relative 02/28/2014 0  0 - 1 % Final  . Basophils Absolute 02/28/2014 0.0  0.0 - 0.1 K/uL Final  . Sodium  02/28/2014 137  135 - 145 mmol/L Final  . Potassium 02/28/2014 3.9  3.5 - 5.1 mmol/L Final  . Chloride 02/28/2014 104  96 - 112 mmol/L Final  . CO2 02/28/2014 27  19 - 32 mmol/L Final  . Glucose, Bld 02/28/2014 141* 70 - 99 mg/dL Final  . BUN 02/28/2014 13  6 - 23 mg/dL Final  . Creatinine, Ser 02/28/2014 0.93  0.50 - 1.35 mg/dL Final  . Calcium 02/28/2014 8.7  8.4 - 10.5 mg/dL Final  . GFR calc non Af Amer 02/28/2014 81* >90 mL/min Final  . GFR calc Af Amer 02/28/2014 >90  >90 mL/min Final   Comment: (NOTE) The eGFR has been calculated using the CKD EPI equation. This calculation has not been validated in all clinical situations. eGFR's persistently <90 mL/min signify possible Chronic Kidney Disease.   . Anion gap 02/28/2014 6  5 - 15 Final  . Troponin I 02/28/2014 <0.03  <0.031 ng/mL Final   Comment:        NO INDICATION OF MYOCARDIAL INJURY.   . Glucose-Capillary 02/28/2014 124* 70 - 99 mg/dL Final  . Specimen Description 02/28/2014 URINE, CLEAN CATCH   Final  . Special Requests 02/28/2014 NONE   Final  . Colony Count 02/28/2014    Final                   Value:NO GROWTH Performed at [facility]   . Culture 02/28/2014    Final                   Value:NO GROWTH Performed at [facility]   . Report Status 02/28/2014 03/01/2014 FINAL   Final  . Color, Urine 02/28/2014 YELLOW  YELLOW Final  . APPearance 02/28/2014 CLEAR  CLEAR Final  . Specific Gravity, Urine 02/28/2014 1.014  1.005 - 1.030 Final  . pH 02/28/2014 7.5  5.0 - 8.0 Final  . Glucose, UA 02/28/2014 NEGATIVE  NEGATIVE mg/dL Final  . Hgb urine dipstick 02/28/2014 NEGATIVE  NEGATIVE Final  . Bilirubin Urine 02/28/2014 NEGATIVE  NEGATIVE Final  . Ketones, ur 02/28/2014 NEGATIVE  NEGATIVE mg/dL Final  . Protein, ur 02/28/2014 NEGATIVE  NEGATIVE mg/dL Final  . Urobilinogen, UA 02/28/2014 0.2  0.0 - 1.0 mg/dL Final  . Nitrite 02/28/2014 NEGATIVE  NEGATIVE Final  . Leukocytes, UA 02/28/2014 NEGATIVE   NEGATIVE Final   MICROSCOPIC NOT DONE ON URINES WITH NEGATIVE PROTEIN, BLOOD, LEUKOCYTES, NITRITE, OR GLUCOSE <1000 mg/dL.  [name] D-Dimer, Quant 02/28/2014 1.93* 0.00 - 0.48 ug/mL-FEU Final   Comment:  AT THE INHOUSE ESTABLISHED CUTOFF VALUE OF 0.48 ug/mL FEU, THIS ASSAY HAS BEEN DOCUMENTED IN THE LITERATURE TO HAVE A SENSITIVITY AND NEGATIVE PREDICTIVE VALUE OF AT LEAST 98 TO 99%.  THE TEST RESULT SHOULD BE CORRELATED WITH AN ASSESSMENT OF THE CLINICAL PROBABILITY OF DVT / VTE.   . WBC 02/28/2014 6.1  4.0 - 10.5 K/uL Final  . RBC 02/28/2014 3.78* 4.22 - 5.81 MIL/uL Final  . Hemoglobin 02/28/2014 11.6* 13.0 - 17.0 g/dL Final  . HCT 02/28/2014 34.3* 39.0 - 52.0 % Final  . MCV 02/28/2014 90.7  78.0 - 100.0 fL Final  . MCH 02/28/2014 30.7  26.0 - 34.0 pg Final  . MCHC 02/28/2014 33.8  30.0 - 36.0 g/dL Final  . RDW 02/28/2014 14.3  11.5 - 15.5 % Final  . Platelets 02/28/2014 110* 150 - 400 K/uL Final   CONSISTENT WITH PREVIOUS RESULT  . Creatinine, Ser 02/28/2014 0.79  0.50 - 1.35 mg/dL Final  . GFR calc non Af Amer 02/28/2014 86* >90 mL/min Final  . GFR calc Af Amer 02/28/2014 >90  >90 mL/min Final   Comment: (NOTE) The eGFR has been calculated using the CKD EPI equation. This calculation has not been validated in all clinical situations. eGFR's persistently <90 mL/min signify possible Chronic Kidney Disease.   [name] TSH 02/28/2014 1.127  0.350 - 4.500 uIU/mL Final  . Troponin I 02/28/2014 <0.03  <0.031 ng/mL Final   Comment:        NO INDICATION OF MYOCARDIAL INJURY.   . Troponin I 03/01/2014 <0.03  <0.031 ng/mL Final   Comment:        NO INDICATION OF MYOCARDIAL INJURY.   . WBC 03/01/2014 5.6  4.0 - 10.5 K/uL Final  . RBC 03/01/2014 3.63* 4.22 - 5.81 MIL/uL Final  . Hemoglobin 03/01/2014 11.3* 13.0 - 17.0 g/dL Final  . HCT 03/01/2014 34.0* 39.0 - 52.0 % Final  . MCV 03/01/2014 93.7  78.0 - 100.0 fL Final  . MCH 03/01/2014 31.1  26.0 - 34.0 pg Final  . MCHC 03/01/2014  33.2  30.0 - 36.0 g/dL Final  . RDW 03/01/2014 14.4  11.5 - 15.5 % Final  . Platelets 03/01/2014 PLATELET CLUMPS NOTED ON SMEAR, UNABLE TO ESTIMATE  150 - 400 K/uL Corrected   Comment: PLATELET CLUMPING, SUGGEST RECOLLECTION OF SAMPLE IN CITRATE TUBE. CORRECTED ON 01/31 AT 2101: PREVIOUSLY REPORTED AS 29 PLATELET CLUMPS NOTED ON SMEAR, UNABLE TO ESTIMATE PLATELET CLUMPING, SUGGEST RECOLLECTION OF SAMPLE IN CITRATE TUBE.   . Troponin I 03/01/2014 <0.03  <0.031 ng/mL Final   Comment:        NO INDICATION OF MYOCARDIAL INJURY.   . Platelets 03/01/2014 PLATELET CLUMPS NOTED ON SMEAR, UNABLE TO ESTIMATE  150 - 400 K/uL Corrected   Comment: CORRECTED RESULTS CALLED TO: JESSICA LAUER,RN AT [id number] 03/02/14 BY ZBEECH. CORRECTED ON 02/01 AT 1545: PREVIOUSLY REPORTED AS 32 CONSISTENT WITH PREVIOUS RESULT   . Heparin Induced Plt Ab 03/01/2014 Negative  Negative Final   Comment: (NOTE) This test is approximately 95% sensitive for detecting antibodies to the Platelet Factor 4/Heparin Complex (PF4/Heparin). Although the result of this patient's sample is negative, a small percentage of patients may still be positive by the Serotonin Release Assay (SRA), possibly due to other target antigens involved in this reaction such as Interleukin-8 or Neutrophil- Activating Peptide-2 (NAP-2). A clinical risk assessment score may provide additional guidance in deciding whether additional testing is of value (J Thromb Haemost 2008;6:1304-12).   . Patient O.D. 03/01/2014  0.259   Final   Comment: (NOTE) ______________________________________ !   O.D.units        !    Result       ! !____________________!_________________! ! <=0.300            !   Negative      ! !  >0.300 to <=0.500 !  Weak Positive  ! !  >0.500            !   Positive      ! !____________________!_________________! For more information on this test, go to: http://education.quest[facility].com/faq/FAQ06V1   . [facility] SRA Result 03/01/2014  Negative  Negative Final   Comment: (NOTE) A sample is considered negative if there is <20% release. The SRA has a sensitivity (88-100%) and specificity (89-100%) for Heparin Induced Thrombocytopenia (HIT) Rondel Baton, Hematology Am Soc Hematol Educ Program. 2009; 225-232).   [name] [facility]FH Low Dose 0.1 IU/mL 03/01/2014 0   Final  . UFH Low Dose 0.5 IU/mL 03/01/2014 0   Final  . UFH High Dose [facility]FH H 03/01/2014 0   Final   Comment: (NOTE) This test was developed and its analytical performance characteristics have been determined by [facility], [place], [place]. It has not been cleared or approved by the U.S. Food and Drug Administration. The FDA has determined that such clearance or approval is not necessary. This assay has been validated pursuant to [employer] and is used for clinical purposes. For more information on this test, go to: http://education.[facility].com/faq/FAQ06V1 Performed at [facility]   . WBC 03/01/2014 5.9  4.0 - 10.5 K/uL Final  . RBC 03/01/2014 3.79* 4.22 - 5.81 MIL/uL Final  . Hemoglobin 03/01/2014 11.5* 13.0 - 17.0 g/dL Final  . HCT 03/01/2014 35.0* 39.0 - 52.0 % Final  . MCV 03/01/2014 92.3  78.0 - 100.0 fL Final  . MCH 03/01/2014 30.3  26.0 - 34.0 pg Final  . MCHC 03/01/2014 32.9  30.0 - 36.0 g/dL Final  . RDW 03/01/2014 14.2  11.5 - 15.5 % Final  . Platelets 03/01/2014 151  150 - 400 K/uL Final   DELTA CHECK NOTED  . Sodium 03/01/2014 136  135 - 145 mmol/L Final  . Potassium 03/01/2014 3.8  3.5 - 5.1 mmol/L Final  . Chloride 03/01/2014 104  96 - 112 mmol/L Final  . CO2 03/01/2014 28  19 - 32 mmol/L Final  . Glucose, Bld 03/01/2014 106* 70 - 99 mg/dL Final  . BUN 03/01/2014 12  6 - 23 mg/dL Final  . Creatinine, Ser 03/01/2014 0.88  0.50 - 1.35 mg/dL Final  . Calcium 03/01/2014 9.1  8.4 - 10.5 mg/dL Final  . GFR calc non Af Amer 03/01/2014 83* >90 mL/min Final  . GFR calc Af Amer 03/01/2014 >90  >90 mL/min Final     Comment: (NOTE) The eGFR has been calculated using the CKD EPI equation. This calculation has not been validated in all clinical situations. eGFR's persistently <90 mL/min signify possible Chronic Kidney Disease.   . Anion gap 03/01/2014 4* 5 - 15 Final  . aPTT 03/01/2014 38* 24 - 37 seconds Final   Comment:        IF BASELINE aPTT IS ELEVATED, SUGGEST PATIENT RISK ASSESSMENT BE USED TO DETERMINE APPROPRIATE ANTICOAGULANT THERAPY.   [name] aPTT 03/01/2014 42* 24 - 37 seconds Final   Comment:        IF BASELINE aPTT IS ELEVATED, SUGGEST PATIENT RISK ASSESSMENT BE USED TO DETERMINE APPROPRIATE ANTICOAGULANT THERAPY.   [name]  aPTT 03/01/2014 45* 24 - 37 seconds Final   Comment:        IF BASELINE aPTT IS ELEVATED, SUGGEST PATIENT RISK ASSESSMENT BE USED TO DETERMINE APPROPRIATE ANTICOAGULANT THERAPY.   [name] aPTT 03/02/2014 32  24 - 37 seconds Final  . aPTT 03/02/2014 34  24 - 37 seconds Final  . aPTT 03/02/2014 43* 24 - 37 seconds Final   Comment:        IF BASELINE aPTT IS ELEVATED, SUGGEST PATIENT RISK ASSESSMENT BE USED TO DETERMINE APPROPRIATE ANTICOAGULANT THERAPY.   [name] aPTT 03/02/2014 49* 24 - 37 seconds Final   Comment:        IF BASELINE aPTT IS ELEVATED, SUGGEST PATIENT RISK ASSESSMENT BE USED TO DETERMINE APPROPRIATE ANTICOAGULANT THERAPY.   . Prothrombin Time 03/02/2014 19.4* 11.6 - 15.2 seconds Final  . INR 03/02/2014 1.63* 0.00 - 1.49 Final  . WBC 03/03/2014 5.5  4.0 - 10.5 K/uL Final  . RBC 03/03/2014 3.92* 4.22 - 5.81 MIL/uL Final  . Hemoglobin 03/03/2014 12.2* 13.0 - 17.0 g/dL Final  . HCT 03/03/2014 36.4* 39.0 - 52.0 % Final  . MCV 03/03/2014 92.9  78.0 - 100.0 fL Final  . MCH 03/03/2014 31.1  26.0 - 34.0 pg Final  . MCHC 03/03/2014 33.5  30.0 - 36.0 g/dL Final  . RDW 03/03/2014 14.2  11.5 - 15.5 % Final  . Platelets 03/03/2014 PLATELET CLUMPS NOTED ON SMEAR, UNABLE TO ESTIMATE  150 - 400 K/uL Final   PLATELET CLUMPING, SUGGEST RECOLLECTION OF SAMPLE IN  CITRATE TUBE.  . Sodium 03/03/2014 138  135 - 145 mmol/L Final  . Potassium 03/03/2014 4.0  3.5 - 5.1 mmol/L Final  . Chloride 03/03/2014 104  96 - 112 mmol/L Final  . CO2 03/03/2014 25  19 - 32 mmol/L Final  . Glucose, Bld 03/03/2014 102* 70 - 99 mg/dL Final  . BUN 03/03/2014 10  6 - 23 mg/dL Final  . Creatinine, Ser 03/03/2014 0.88  0.50 - 1.35 mg/dL Final  . Calcium 03/03/2014 8.9  8.4 - 10.5 mg/dL Final  . GFR calc non Af Amer 03/03/2014 83* >90 mL/min Final  . GFR calc Af Amer 03/03/2014 >90  >90 mL/min Final   Comment: (NOTE) The eGFR has been calculated using the CKD EPI equation. This calculation has not been validated in all clinical situations. eGFR's persistently <90 mL/min signify possible Chronic Kidney Disease.   . Anion gap 03/03/2014 9  5 - 15 Final  . WBC 03/03/2014 6.5  4.0 - 10.5 K/uL Final  . RBC 03/03/2014 3.99* 4.22 - 5.81 MIL/uL Final  . Hemoglobin 03/03/2014 12.2* 13.0 - 17.0 g/dL Final  . HCT 03/03/2014 35.9* 39.0 - 52.0 % Final  . MCV 03/03/2014 90.0  78.0 - 100.0 fL Final  . MCH 03/03/2014 30.6  26.0 - 34.0 pg Final  . MCHC 03/03/2014 34.0  30.0 - 36.0 g/dL Final  . RDW 03/03/2014 13.9  11.5 - 15.5 % Final  . Platelets 03/03/2014 PLATELET CLUMPS NOTED ON SMEAR, UNABLE TO ESTIMATE  150 - 400 K/uL Final   Clumping noted in both citrate and EDTA collection tubes.  Admission on 01/22/2014, Discharged on 01/26/2014  Component Date Value Ref Range Status  . WBC 01/22/2014 9.6  4.0 - 10.5 K/uL Final   WHITE COUNT CONFIRMED ON SMEAR  . RBC 01/22/2014 4.26  4.22 - 5.81 MIL/uL Final  . Hemoglobin 01/22/2014 13.5  13.0 - 17.0 g/dL Final  . HCT 01/22/2014 39.5  39.0 -  52.0 % Final  . MCV 01/22/2014 92.7  78.0 - 100.0 fL Final  . MCH 01/22/2014 31.7  26.0 - 34.0 pg Final  . MCHC 01/22/2014 34.2  30.0 - 36.0 g/dL Final  . RDW 01/22/2014 14.2  11.5 - 15.5 % Final  . Platelets 01/22/2014 PLATELET CLUMPS NOTED ON SMEAR, UNABLE TO ESTIMATE  150 - 400 K/uL Final  .  Neutrophils Relative % 01/22/2014 84* 43 - 77 % Final  . Lymphocytes Relative 01/22/2014 8* 12 - 46 % Final  . Monocytes Relative 01/22/2014 8  3 - 12 % Final  . Eosinophils Relative 01/22/2014 0  0 - 5 % Final  . Basophils Relative 01/22/2014 0  0 - 1 % Final  . Neutro Abs 01/22/2014 8.0* 1.7 - 7.7 K/uL Final  . Lymphs Abs 01/22/2014 0.8  0.7 - 4.0 K/uL Final  . Monocytes Absolute 01/22/2014 0.8  0.1 - 1.0 K/uL Final  . Eosinophils Absolute 01/22/2014 0.0  0.0 - 0.7 K/uL Final  . Basophils Absolute 01/22/2014 0.0  0.0 - 0.1 K/uL Final  . Smear Review 01/22/2014 MORPHOLOGY UNREMARKABLE   Final  . Sodium 01/22/2014 136  135 - 145 mmol/L Final   Please note change in reference range.  . Potassium 01/22/2014 3.8  3.5 - 5.1 mmol/L Final   Please note change in reference range.  . Chloride 01/22/2014 101  96 - 112 mEq/L Final  . CO2 01/22/2014 28  19 - 32 mmol/L Final  . Glucose, Bld 01/22/2014 115* 70 - 99 mg/dL Final  . BUN 01/22/2014 14  6 - 23 mg/dL Final  . Creatinine, Ser 01/22/2014 0.89  0.50 - 1.35 mg/dL Final  . Calcium 01/22/2014 8.8  8.4 - 10.5 mg/dL Final  . Total Protein 01/22/2014 7.5  6.0 - 8.3 g/dL Final  . Albumin 01/22/2014 3.8  3.5 - 5.2 g/dL Final  . AST 01/22/2014 15  0 - 37 U/L Final  . ALT 01/22/2014 13  0 - 53 U/L Final  . Alkaline Phosphatase 01/22/2014 49  39 - 117 U/L Final  . Total Bilirubin 01/22/2014 0.4  0.3 - 1.2 mg/dL Final  . GFR calc non Af Amer 01/22/2014 82* >90 mL/min Final  . GFR calc Af Amer 01/22/2014 >90  >90 mL/min Final   Comment: (NOTE) The eGFR has been calculated using the CKD EPI equation. This calculation has not been validated in all clinical situations. eGFR's persistently <90 mL/min signify possible Chronic Kidney Disease.   . Anion gap 01/22/2014 7  5 - 15 Final  . Prothrombin Time 01/22/2014 13.6  11.6 - 15.2 seconds Final  . INR 01/22/2014 1.03  0.00 - 1.49 Final  . ABO/RH(D) 01/22/2014 A POS   Final  . Antibody Screen  01/22/2014 NEG   Final  . Sample Expiration 01/22/2014 01/25/2014   Final  . ABO/RH(D) 01/22/2014 A POS   Final  . Sodium 01/22/2014 139  135 - 145 mmol/L Final  . Potassium 01/22/2014 3.8  3.5 - 5.1 mmol/L Final  . Chloride 01/22/2014 101  96 - 112 mEq/L Final  . BUN 01/22/2014 13  6 - 23 mg/dL Final  . Creatinine, Ser 01/22/2014 0.90  0.50 - 1.35 mg/dL Final  . Glucose, Bld 01/22/2014 112* 70 - 99 mg/dL Final  . Calcium, Ion 01/22/2014 1.17  1.13 - 1.30 mmol/L Final  . TCO2 01/22/2014 23  0 - 100 mmol/L Final  . Hemoglobin 01/22/2014 14.3  13.0 - 17.0 g/dL Final  . HCT  01/22/2014 42.0  39.0 - 52.0 % Final  . WBC 01/23/2014 4.9  4.0 - 10.5 K/uL Final   WHITE COUNT CONFIRMED ON SMEAR  . RBC 01/23/2014 3.72* 4.22 - 5.81 MIL/uL Final  . Hemoglobin 01/23/2014 11.8* 13.0 - 17.0 g/dL Final  . HCT 01/23/2014 34.1* 39.0 - 52.0 % Final  . MCV 01/23/2014 91.7  78.0 - 100.0 fL Final  . MCH 01/23/2014 31.7  26.0 - 34.0 pg Final  . MCHC 01/23/2014 34.6  30.0 - 36.0 g/dL Final  . RDW 01/23/2014 14.2  11.5 - 15.5 % Final  . Platelets 01/23/2014 PLATELET CLUMPS NOTED ON SMEAR, UNABLE TO ESTIMATE  150 - 400 K/uL Final  . Sodium 01/23/2014 133* 135 - 145 mmol/L Final   Please note change in reference range.  . Potassium 01/23/2014 3.8  3.5 - 5.1 mmol/L Final   Please note change in reference range.  . Chloride 01/23/2014 101  96 - 112 mEq/L Final  . CO2 01/23/2014 26  19 - 32 mmol/L Final  . Glucose, Bld 01/23/2014 127* 70 - 99 mg/dL Final  . BUN 01/23/2014 11  6 - 23 mg/dL Final  . Creatinine, Ser 01/23/2014 0.85  0.50 - 1.35 mg/dL Final  . Calcium 01/23/2014 8.4  8.4 - 10.5 mg/dL Final  . GFR calc non Af Amer 01/23/2014 84* >90 mL/min Final  . GFR calc Af Amer 01/23/2014 >90  >90 mL/min Final   Comment: (NOTE) The eGFR has been calculated using the CKD EPI equation. This calculation has not been validated in all clinical situations. eGFR's persistently <90 mL/min signify possible Chronic  Kidney Disease.   . Anion gap 01/23/2014 6  5 - 15 Final  . WBC 01/22/2014 5.5  4.0 - 10.5 K/uL Final  . RBC 01/22/2014 3.83* 4.22 - 5.81 MIL/uL Final  . Hemoglobin 01/22/2014 12.0* 13.0 - 17.0 g/dL Final  . HCT 01/22/2014 35.4* 39.0 - 52.0 % Final  . MCV 01/22/2014 92.4  78.0 - 100.0 fL Final  . MCH 01/22/2014 31.3  26.0 - 34.0 pg Final  . MCHC 01/22/2014 33.9  30.0 - 36.0 g/dL Final  . RDW 01/22/2014 14.2  11.5 - 15.5 % Final  . Platelets 01/22/2014 PLATELET CLUMPS NOTED ON SMEAR, UNABLE TO ESTIMATE  150 - 400 K/uL Final  . Creatinine, Ser 01/22/2014 0.94  0.50 - 1.35 mg/dL Final  . GFR calc non Af Amer 01/22/2014 80* >90 mL/min Final  . GFR calc Af Amer 01/22/2014 >90  >90 mL/min Final   Comment: (NOTE) The eGFR has been calculated using the CKD EPI equation. This calculation has not been validated in all clinical situations. eGFR's persistently <90 mL/min signify possible Chronic Kidney Disease.   . WBC 01/24/2014 5.6  4.0 - 10.5 K/uL Final   WHITE COUNT CONFIRMED ON SMEAR  . RBC 01/24/2014 3.31* 4.22 - 5.81 MIL/uL Final  . Hemoglobin 01/24/2014 10.2* 13.0 - 17.0 g/dL Final  . HCT 01/24/2014 30.5* 39.0 - 52.0 % Final  . MCV 01/24/2014 92.1  78.0 - 100.0 fL Final  . MCH 01/24/2014 30.8  26.0 - 34.0 pg Final  . MCHC 01/24/2014 33.4  30.0 - 36.0 g/dL Final  . RDW 01/24/2014 14.3  11.5 - 15.5 % Final  . Platelets 01/24/2014 PLATELET CLUMPS NOTED ON SMEAR, COUNT APPEARS DECREASED  150 - 400 K/uL Final  . Sodium 01/24/2014 134* 135 - 145 mmol/L Final   Please note change in reference range.  . Potassium 01/24/2014 3.8  3.5 -  5.1 mmol/L Final   Please note change in reference range.  . Chloride 01/24/2014 101  96 - 112 mEq/L Final  . CO2 01/24/2014 28  19 - 32 mmol/L Final  . Glucose, Bld 01/24/2014 124* 70 - 99 mg/dL Final  . BUN 01/24/2014 12  6 - 23 mg/dL Final  . Creatinine, Ser 01/24/2014 1.02  0.50 - 1.35 mg/dL Final  . Calcium 01/24/2014 8.1* 8.4 - 10.5 mg/dL Final  .  GFR calc non Af Amer 01/24/2014 70* >90 mL/min Final  . GFR calc Af Amer 01/24/2014 82* >90 mL/min Final   Comment: (NOTE) The eGFR has been calculated using the CKD EPI equation. This calculation has not been validated in all clinical situations. eGFR's persistently <90 mL/min signify possible Chronic Kidney Disease.   . Anion gap 01/24/2014 5  5 - 15 Final  . WBC 01/25/2014 5.1  4.0 - 10.5 K/uL Final  . RBC 01/25/2014 3.03* 4.22 - 5.81 MIL/uL Final  . Hemoglobin 01/25/2014 9.5* 13.0 - 17.0 g/dL Final  . HCT 01/25/2014 28.1* 39.0 - 52.0 % Final  . MCV 01/25/2014 92.7  78.0 - 100.0 fL Final  . MCH 01/25/2014 31.4  26.0 - 34.0 pg Final  . MCHC 01/25/2014 33.8  30.0 - 36.0 g/dL Final  . RDW 01/25/2014 14.4  11.5 - 15.5 % Final  . Platelets 01/25/2014 PLATELET CLUMPS NOTED ON SMEAR, COUNT APPEARS DECREASED  150 - 400 K/uL Final   PLATELET CLUMPING, SUGGEST RECOLLECTION OF SAMPLE IN CITRATE TUBE.  . Sodium 01/25/2014 135  135 - 145 mmol/L Final   Please note change in reference range.  . Potassium 01/25/2014 3.6  3.5 - 5.1 mmol/L Final   Please note change in reference range.  . Chloride 01/25/2014 102  96 - 112 mEq/L Final  . CO2 01/25/2014 27  19 - 32 mmol/L Final  . Glucose, Bld 01/25/2014 133* 70 - 99 mg/dL Final  . BUN 01/25/2014 8  6 - 23 mg/dL Final  . Creatinine, Ser 01/25/2014 0.86  0.50 - 1.35 mg/dL Final  . Calcium 01/25/2014 8.1* 8.4 - 10.5 mg/dL Final  . GFR calc non Af Amer 01/25/2014 83* >90 mL/min Final  . GFR calc Af Amer 01/25/2014 >90  >90 mL/min Final   Comment: (NOTE) The eGFR has been calculated using the CKD EPI equation. This calculation has not been validated in all clinical situations. eGFR's persistently <90 mL/min signify possible Chronic Kidney Disease.   . Anion gap 01/25/2014 6  5 - 15 Final  . WBC 01/25/2014 5.4  4.0 - 10.5 K/uL Final  . RBC 01/25/2014 3.26* 4.22 - 5.81 MIL/uL Final  . Hemoglobin 01/25/2014 9.9* 13.0 - 17.0 g/dL Final  . HCT  01/25/2014 29.4* 39.0 - 52.0 % Final  . MCV 01/25/2014 90.2  78.0 - 100.0 fL Final  . MCH 01/25/2014 30.4  26.0 - 34.0 pg Final  . MCHC 01/25/2014 33.7  30.0 - 36.0 g/dL Final  . RDW 01/25/2014 14.3  11.5 - 15.5 % Final  . Platelets 01/25/2014 106* 150 - 400 K/uL Final   Comment: PLATELET COUNT PERFORMED ON CITRATED BLOOD PLATELET COUNT CONFIRMED BY SMEAR   . Hgb A1c MFr Bld 01/25/2014 6.0* <5.7 % Final   Comment: (NOTE)  According to the ADA Clinical Practice Recommendations for 2011, when HbA1c is used as a screening test:  >=6.5%   Diagnostic of Diabetes Mellitus           (if abnormal result is confirmed) 5.7-6.4%   Increased risk of developing Diabetes Mellitus References:Diagnosis and Classification of Diabetes Mellitus,Diabetes INOM,7672,09(OBSJG 1):S62-S69 and Standards of Medical Care in         Diabetes - 2011,Diabetes GEZM,6294,76 (Suppl 1):S11-S61.   . Mean Plasma Glucose 01/25/2014 126* <117 mg/dL Final   Performed at [facility]  . Sodium 01/26/2014 135  135 - 145 mmol/L Final   Please note change in reference range.  . Potassium 01/26/2014 3.9  3.5 - 5.1 mmol/L Final   Please note change in reference range.  . Chloride 01/26/2014 101  96 - 112 mEq/L Final  . CO2 01/26/2014 28  19 - 32 mmol/L Final  . Glucose, Bld 01/26/2014 131* 70 - 99 mg/dL Final  . BUN 01/26/2014 6  6 - 23 mg/dL Final  . Creatinine, Ser 01/26/2014 0.81  0.50 - 1.35 mg/dL Final  . Calcium 01/26/2014 8.6  8.4 - 10.5 mg/dL Final  . GFR calc non Af Amer 01/26/2014 85* >90 mL/min Final  . GFR calc Af Amer 01/26/2014 >90  >90 mL/min Final   Comment: (NOTE) The eGFR has been calculated using the CKD EPI equation. This calculation has not been validated in all clinical situations. eGFR's persistently <90 mL/min signify possible Chronic Kidney Disease.   Georgiann Hahn gap 01/26/2014 6  5 - 15 Final   Hospital records  reviewed  Assessment/Plan   ICD-9-CM ICD-10-CM   1. Acute pulmonary embolism 415.19 I26.99   2. DVT (deep venous thrombosis), left 453.40 I82.402   3. Hip fracture requiring operative repair, left, closed, with routine healing, subsequent encounter V54.13 S72.002D   4. BPH (benign prostatic hypertrophy) 600.00 N40.0    --he is medically stable to be d/c'd to home  --f/u with [facility] 03/26/14 as scheduled  --Patient is being discharged with home health services:    --Patient is being discharged with the following durable medical equipment:  Walker, reacher, sock aid, shoe horn  --Patient has been advised to f/u with their PCP in 1-2 weeks to bring them up to date on their rehab stay.  They were provided with a 30 day supply of scripts for prescription medications and refills must be obtained from their PCP. He already has Nutritional therapist Rx at home and will take as directed for at least 3 mos.   Prabhnoor Ellenberger S. Perlie Gold  [facility] [address] [place], [place] [address] [phone] Office (Wednesdays and Fridays 8 AM - 5 PM) [phone] Cell (Monday-Friday 8 AM - 5 PM)

## 2014-03-09 ENCOUNTER — Other Ambulatory Visit: Payer: Self-pay | Admitting: *Deleted

## 2014-03-09 MED ORDER — OXYCODONE HCL 5 MG PO TABS: ORAL_TABLET | ORAL | Status: DC

## 2014-03-09 NOTE — Telephone Encounter (Signed)
[facility] 

## 2014-03-19 DIAGNOSIS — K5900 Constipation, unspecified: Secondary | ICD-10-CM | POA: Insufficient documentation

## 2014-03-19 NOTE — Progress Notes (Signed)
Patient ID: [name] [name], male   DOB: [birth year], 74 y.o.   MRN: [id number]  [facility]     No Known Allergies     Chief Complaint  Patient presents with  . Medical Management of Chronic Issues    HPI:  He is a resident of this facility being seen for the management of his chronic illnesses. Overall his statu is stable. He is not voicing any complaints at this time; he states he feels good. There are no nursing concerns at this time.    Past Medical History  Diagnosis Date  . BPH (benign prostatic hypertrophy)   . Osteoarthritis   . Lumbar disc disease     Past Surgical History  Procedure Laterality Date  . Lumbar laminectomy    . Intramedullary (im) nail intertrochanteric Left 01/22/2014    Procedure: INTRAMEDULLARY (IM) NAIL INTERTROCHANTRIC;  Surgeon: Melina Schools, MD;  Location: [place];  Service: Orthopedics;  Laterality: Left;    VITAL SIGNS BP 127/71 mmHg  Pulse 86  Ht 6' (1.829 m)  Wt 204 lb (92.534 kg)  BMI 27.66 kg/m2  SpO2 97%   MEDICATIONS:  Asa 325 mg daily Colace 200 mg daily Oxycodone 5 or 10 mg every 4 hours as needed flomax 0.8 mg daily    SIGNIFICANT DIAGNOSTIC EXAMS    Review of Systems  Constitutional: Negative for malaise/fatigue.  Respiratory: Negative for cough and shortness of breath.   Cardiovascular: Negative for chest pain, palpitations and leg swelling.  Gastrointestinal: Negative for heartburn, abdominal pain and constipation.  Genitourinary: Negative for dysuria and frequency.  Musculoskeletal: Negative for myalgias and joint pain.  Skin: Negative.   Psychiatric/Behavioral: The patient is not nervous/anxious.      Physical Exam  Constitutional: He is oriented to person, place, and time. He appears well-developed and well-nourished. No distress.  Neck: Neck supple. No JVD present.  Cardiovascular: Normal rate, regular rhythm and intact distal pulses.   Respiratory: Effort normal and breath sounds normal. No  respiratory distress.  GI: Soft. Bowel sounds are normal. He exhibits no distension. There is no tenderness.  Musculoskeletal: He exhibits no edema.  Is able to move all extremities Is status post left hip fracture   Neurological: He is alert and oriented to person, place, and time.  Skin: Skin is warm and dry. He is not diaphoretic.       ASSESSMENT/ PLAN:  1. Left hip fracture: will continue therapy as directed; will continue to be followed by orthopedics; will continue oxycodone 5 or 10 mg every 4 hours as needed for pain; he completed lovenox therapy 02-24-14; will continue asa 325 mg daily    will monitor  2. BPH: is voiding without difficulty; will continue flomax 0.8 mg daily   3. Constipation: will continue colace 200 mg daily     Ok Edwards NP [facility]  Contact [phone] Monday through Friday 8am- 5pm  After hours call [phone]

## 2014-04-05 NOTE — Progress Notes (Signed)
Patient ID: [name] [name], male   DOB: 01-17-1941, 74 y.o.   MRN: [id number]  [facility]     No Known Allergies     Chief Complaint  Patient presents with  . Hospitalization Follow-up    HPI:  He has been hospitalized for an acute PE and dvt. He is here originally for a left hip fracture. He would like to go home by the end of this week. He states he is feeling better; he has completed his rehab for his hip fracture and is nearly ready to go home. There are no nursing concerns at this time.    Past Medical History  Diagnosis Date  . BPH (benign prostatic hypertrophy)   . Osteoarthritis   . Lumbar disc disease     Past Surgical History  Procedure Laterality Date  . Lumbar laminectomy    . Intramedullary (im) nail intertrochanteric Left 01/22/2014    Procedure: INTRAMEDULLARY (IM) NAIL INTERTROCHANTRIC;  Surgeon: Melina Schools, MD;  Location: [place];  Service: Orthopedics;  Laterality: Left;    VITAL SIGNS BP 127/71 mmHg  Pulse 86  Ht 6' (1.829 m)  Wt 204 lb (92.534 kg)  BMI 27.66 kg/m2  SpO2 97%   Outpatient Encounter Prescriptions as of 03/04/2014  Medication Sig  . acetaminophen (TYLENOL) 325 MG tablet Take 2 tablets (650 mg total) by mouth every 6 (six) hours as needed for moderate pain (headache).  . docusate sodium (COLACE) 100 MG capsule Take 100 mg by mouth daily.     SIGNIFICANT DIAGNOSTIC EXAMS  02-28-14: chest x-ray: No radiographic evidence of acute cardiopulmonary disease. Atherosclerosis  02-28-14: ct of head: Mild diffuse cortical atrophy. Mild chronic ischemic white matter disease. No acute intracranial abnormality seen.  03-01-14: ct angio of chest: The study is positive for pulmonary embolus with only a single, small embolus identified in a segmental and subsegmental branch the right lower lobe. Negative for right heart strain.  03-01-14: mri of brain: Normal study for a person of this age. Minimal small vessel change of the hemispheric white matter,  less than often seen in healthy individuals.  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  03-02-14: lower extremity doppler: Findings of DVT in a branch of the left popliteal vein. No DVT in the right lower extremity.  03-02-14: 2-d echo: Left ventricle: The cavity size was normal. Wall thickness was increased in a pattern of mild LVH. Systolic function was normal. The estimated ejection fraction was in the range of 55% to 60%.  Regional wall motion abnormalities cannot be excluded. - Left atrium: The atrium was mildly dilated. - Right ventricle: The cavity size was dilated. Wall thickness was increased. Systolic function was mildly reduced. - Right atrium: The atrium was moderately dilated.    LABS REVIEWED:   02-28-14: wbc 5.8; hgb 12.0; hct 35.9; mcv 91.6; plt 110; glucose 141; bun 13; creat 0.93; k+3.9; na++137; tsh 1.127; d-dimer: 1.93; urine culture: no growth  03-03-14: wbc 5.5; hgb 12.2; hct 36.1; mcv 92.9; plt 151; glucose 102; bun 10; creat 0.88; k+4.0; na++138       Review of Systems  Constitutional: Negative for malaise/fatigue.  Respiratory: Negative for cough, shortness of breath and wheezing.   Cardiovascular: Negative for chest pain, palpitations and leg swelling.  Gastrointestinal: Negative for heartburn, abdominal pain and constipation.  Genitourinary: Negative for dysuria.  Musculoskeletal: Negative for myalgias and joint pain.  Skin: Negative.   Neurological: Negative for headaches.  Psychiatric/Behavioral: Negative for  depression. The patient is not nervous/anxious.      Physical Exam  Constitutional: He is oriented to person, place, and time. He appears well-developed and well-nourished. No distress.  Neck: Neck supple. No JVD present. No thyromegaly present.  Cardiovascular: Normal rate, regular rhythm and intact distal pulses.   Respiratory: Effort normal and breath sounds normal. No respiratory  distress. He has no wheezes.  GI: Soft. Bowel sounds are normal. He exhibits no distension. There is no tenderness.  Musculoskeletal: He exhibits no edema.  Is able to move all extremities   Neurological: He is alert and oriented to person, place, and time.  Skin: Skin is warm and dry. He is not diaphoretic.       ASSESSMENT/ PLAN:  1. Left hip fracture:  Will continue to be followed by orthopedics as directed; will continue with therapy as indicated will continue oxycodone 5 or 10 mg every 4 hours as needed; will monitor   2. PE and left DVT: will continue xarelto 15 mg twice daily for 43 more doses then 20 mg daily will continue to monitor his status.   3. Constipation: will continue colace daily   4. BPH: will continue flomax 0.4 mg daily   Time spent with patient 50 minutes.   Ok Edwards NP [facility]  Contact [phone] Monday through Friday 8am- 5pm  After hours call [phone]

## 2014-06-12 ENCOUNTER — Other Ambulatory Visit: Payer: Self-pay | Admitting: Family Medicine

## 2014-06-12 DIAGNOSIS — Z139 Encounter for screening, unspecified: Secondary | ICD-10-CM

## 2014-06-18 ENCOUNTER — Ambulatory Visit: Payer: Medicare Other

## 2014-06-27 ENCOUNTER — Other Ambulatory Visit: Payer: Self-pay | Admitting: Internal Medicine

## 2014-07-13 ENCOUNTER — Ambulatory Visit
Admission: RE | Admit: 2014-07-13 | Discharge: 2014-07-13 | Disposition: A | Discharge status: Home / Self Care | Payer: Medicare Other | Source: Ambulatory Visit | Attending: Family Medicine | Admitting: Family Medicine

## 2014-07-13 DIAGNOSIS — Z139 Encounter for screening, unspecified: Secondary | ICD-10-CM

## 2014-09-17 ENCOUNTER — Other Ambulatory Visit: Payer: Self-pay | Admitting: Family Medicine

## 2014-09-17 ENCOUNTER — Ambulatory Visit
Admission: RE | Admit: 2014-09-17 | Discharge: 2014-09-17 | Disposition: A | Discharge status: Home / Self Care | Payer: Medicare Other | Source: Ambulatory Visit | Attending: Family Medicine | Admitting: Family Medicine

## 2014-09-17 DIAGNOSIS — Z01818 Encounter for other preprocedural examination: Secondary | ICD-10-CM

## 2014-10-19 ENCOUNTER — Ambulatory Visit (HOSPITAL_COMMUNITY)
Admission: RE | Admit: 2014-10-19 | Discharge: 2014-10-19 | Disposition: A | Discharge status: Home / Self Care | Payer: Medicare Other | Source: Ambulatory Visit | Attending: Cardiovascular Disease | Admitting: Cardiovascular Disease

## 2014-10-19 ENCOUNTER — Other Ambulatory Visit (HOSPITAL_COMMUNITY): Payer: Self-pay | Admitting: Orthopedic Surgery

## 2014-10-19 DIAGNOSIS — Z7902 Long term (current) use of antithrombotics/antiplatelets: Secondary | ICD-10-CM | POA: Insufficient documentation

## 2014-10-19 DIAGNOSIS — M7989 Other specified soft tissue disorders: Secondary | ICD-10-CM

## 2014-10-19 DIAGNOSIS — Z86718 Personal history of other venous thrombosis and embolism: Secondary | ICD-10-CM | POA: Insufficient documentation

## 2014-10-19 DIAGNOSIS — M79662 Pain in left lower leg: Secondary | ICD-10-CM

## 2014-10-19 DIAGNOSIS — M79604 Pain in right leg: Secondary | ICD-10-CM | POA: Diagnosis not present

## 2014-10-19 DIAGNOSIS — M79605 Pain in left leg: Secondary | ICD-10-CM | POA: Diagnosis not present

## 2014-10-23 ENCOUNTER — Ambulatory Visit: Payer: Self-pay | Admitting: Orthopedic Surgery

## 2014-10-29 ENCOUNTER — Ambulatory Visit: Payer: Self-pay | Admitting: Orthopedic Surgery

## 2014-10-29 ENCOUNTER — Other Ambulatory Visit (HOSPITAL_COMMUNITY): Payer: Self-pay | Admitting: Orthopedic Surgery

## 2014-10-29 ENCOUNTER — Other Ambulatory Visit: Payer: Self-pay | Admitting: Orthopedic Surgery

## 2014-10-29 DIAGNOSIS — I2699 Other pulmonary embolism without acute cor pulmonale: Secondary | ICD-10-CM

## 2014-10-29 NOTE — H&P (Signed)
TOTAL KNEE ADMISSION H&P  Patient is being admitted for left total knee arthroplasty.  Subjective:  Chief Complaint:left knee pain.  HPI: [name] [name], 74 y.o. male, has a history of pain and functional disability in the left knee due to arthritis and has failed non-surgical conservative treatments for greater than 12 weeks to includeNSAID's and/or analgesics, corticosteriod injections, flexibility and strengthening excercises, use of assistive devices, weight reduction as appropriate and activity modification.  Onset of symptoms was gradual, starting 5 years ago with gradually worsening course since that time. The patient noted previous left hip fracture nail on the left knee(s).  Patient currently rates pain in the left knee(s) at 10 out of 10 with activity. Patient has night pain, worsening of pain with activity and weight bearing, pain that interferes with activities of daily living, pain with passive range of motion and joint swelling.  Patient has evidence of subchondral cysts, subchondral sclerosis, periarticular osteophytes and joint space narrowing by imaging studies.There is no active infection.  Patient Active Problem List   Diagnosis Date Noted  . Constipation 03/19/2014  . DVT (deep venous thrombosis) 03/03/2014  . Acute pulmonary embolism 03/03/2014  . Syncopal episodes 02/28/2014  . Hip fracture requiring operative repair 01/22/2014  . Hip fracture, left 01/22/2014  . BPH (benign prostatic hypertrophy) 01/22/2014  . Hip fracture 01/22/2014   Past Medical History  Diagnosis Date  . BPH (benign prostatic hypertrophy)   . Osteoarthritis   . Lumbar disc disease     Past Surgical History  Procedure Laterality Date  . Lumbar laminectomy    . Intramedullary (im) nail intertrochanteric Left 01/22/2014    Procedure: INTRAMEDULLARY (IM) NAIL INTERTROCHANTRIC;  Surgeon: Melina Schools, MD;  Location: [place];  Service: Orthopedics;  Laterality: Left;     (Not in a hospital  admission) No Known Allergies  Social History  Substance Use Topics  . Smoking status: Current Every Day Smoker -- 1.50 packs/day    Types: Cigarettes  . Smokeless tobacco: Never Used  . Alcohol Use: No    No family history on file.   Review of Systems  Constitutional: Negative.   HENT: Negative.   Eyes: Negative.   Respiratory: Negative.   Cardiovascular: Negative.   Gastrointestinal: Negative.   Genitourinary: Negative.   Musculoskeletal: Positive for joint pain.  Skin: Negative.   Neurological: Negative.   Endo/Heme/Allergies: Negative.   Psychiatric/Behavioral: Negative.     Objective:  Physical Exam  Constitutional: He is oriented to person, place, and time. He appears well-developed and well-nourished.  HENT:  Head: Normocephalic and atraumatic.  Eyes: EOM are normal. Pupils are equal, round, and reactive to light.  Neck: Normal range of motion. Neck supple.  Cardiovascular: Normal rate, regular rhythm, normal heart sounds and intact distal pulses.   Respiratory: Effort normal and breath sounds normal.  GI: Soft. Bowel sounds are normal. He exhibits no distension. There is no tenderness.  Genitourinary:  deferred  Musculoskeletal:       Left knee: He exhibits decreased range of motion, swelling, effusion and deformity. Tenderness found. Medial joint line and lateral joint line tenderness noted.  Neurological: He is alert and oriented to person, place, and time. He has normal reflexes.  Skin: Skin is warm and dry.  Psychiatric: He has a normal mood and affect. His behavior is normal. Judgment and thought content normal.    Vital signs in last 24 hours: @VSRANGES @  Labs:   Estimated body mass index is 27.66 kg/(m^2) as calculated from the  following:   Height as of 03/04/14: 6' (1.829 m).   Weight as of 03/08/14: 92.534 kg (204 lb).   Imaging Review Plain radiographs demonstrate severe degenerative joint disease of the left knee(s). The overall alignment  ismild varus. The bone quality appears to be adequate for age and reported activity level.  Assessment/Plan:  End stage arthritis, left knee   The patient history, physical examination, clinical judgment of the provider and imaging studies are consistent with end stage degenerative joint disease of the left knee(s) and total knee arthroplasty is deemed medically necessary. The treatment options including medical management, injection therapy arthroscopy and arthroplasty were discussed at length. The risks and benefits of total knee arthroplasty were presented and reviewed. The risks due to aseptic loosening, infection, stiffness, patella tracking problems, thromboembolic complications and other imponderables were discussed. The patient acknowledged the explanation, agreed to proceed with the plan and consent was signed. Patient is being admitted for inpatient treatment for surgery, pain control, PT, OT, prophylactic antibiotics, VTE prophylaxis, progressive ambulation and ADL's and discharge planning. The patient is planning to be discharged home with home health services

## 2014-11-06 NOTE — Patient Instructions (Addendum)
YOUR PROCEDURE IS SCHEDULED ON :  11/17/14  REPORT TO [facility] MAIN ENTRANCE FOLLOW SIGNS TO EAST ELEVATOR - GO TO 3rd FLOOR CHECK IN AT 3 EAST NURSES STATION (SHORT STAY) AT:  5:30 AM  CALL THIS NUMBER IF YOU HAVE PROBLEMS THE MORNING OF SURGERY [phone]  REMEMBER:ONLY 1 PER PERSON MAY GO TO SHORT STAY WITH YOU TO GET READY THE MORNING OF YOUR SURGERY  DO NOT EAT FOOD OR DRINK LIQUIDS AFTER MIDNIGHT  TAKE THESE MEDICINES THE MORNING OF SURGERY: NONE  YOU MAY NOT HAVE ANY METAL ON YOUR BODY INCLUDING HAIR PINS AND PIERCING'S. DO NOT WEAR JEWELRY, MAKEUP, LOTIONS, POWDERS OR PERFUMES. DO NOT WEAR NAIL POLISH. DO NOT SHAVE 48 HRS PRIOR TO SURGERY. MEN MAY SHAVE FACE AND NECK.  DO NOT [place]. [facility] IS NOT RESPONSIBLE FOR VALUABLES.  CONTACTS, DENTURES OR PARTIALS MAY NOT BE WORN TO SURGERY. LEAVE SUITCASE IN CAR. CAN BE BROUGHT TO ROOM AFTER SURGERY.  PATIENTS DISCHARGED THE DAY OF SURGERY WILL NOT BE ALLOWED TO DRIVE HOME.  PLEASE READ OVER THE FOLLOWING INSTRUCTION SHEETS _________________________________________________________________________________                                          [facility] - PREPARING FOR SURGERY  Before surgery, you can play an important role.  Because skin is not sterile, your skin needs to be as free of germs as possible.  You can reduce the number of germs on your skin by washing with CHG (chlorahexidine gluconate) soap before surgery.  CHG is an antiseptic cleaner which kills germs and bonds with the skin to continue killing germs even after washing. Please DO NOT use if you have an allergy to CHG or antibacterial soaps.  If your skin becomes reddened/irritated stop using the CHG and inform your nurse when you arrive at Short Stay. Do not shave (including legs and underarms) for at least 48 hours prior to the first CHG shower.  You may shave your face. Please follow these instructions  carefully:   1.  Shower with CHG Soap the night before surgery and the  morning of Surgery.   2.  If you choose to wash your hair, wash your hair first as usual with your  normal  Shampoo.   3.  After you shampoo, rinse your hair and body thoroughly to remove the  shampoo.                                         4.  Use CHG as you would any other liquid soap.  You can apply chg directly  to the skin and wash . Gently wash with scrungie or clean wascloth    5.  Apply the CHG Soap to your body ONLY FROM THE NECK DOWN.   Do not use on open                           Wound or open sores. Avoid contact with eyes, ears mouth and genitals (private parts).                        Genitals (private parts) with your normal soap.  6.  Wash thoroughly, paying special attention to the area where your surgery  will be performed.   7.  Thoroughly rinse your body with warm water from the neck down.   8.  DO NOT shower/wash with your normal soap after using and rinsing off  the CHG Soap .                9.  Pat yourself dry with a clean towel.             10.  Wear clean night clothes to bed after shower             11.  Place clean sheets on your bed the night of your first shower and do not  sleep with pets.  Day of Surgery : Do not apply any lotions/deodorants the morning of surgery.  Please wear clean clothes to the hospital/surgery center.  FAILURE TO FOLLOW THESE INSTRUCTIONS MAY RESULT IN THE CANCELLATION OF YOUR SURGERY    PATIENT SIGNATURE_________________________________  ______________________________________________________________________     Adam Phenix  An incentive spirometer is a tool that can help keep your lungs clear and active. This tool measures how well you are filling your lungs with each breath. Taking long deep breaths may help reverse or decrease the chance of developing breathing (pulmonary) problems (especially infection) following:  A long  period of time when you are unable to move or be active. BEFORE THE PROCEDURE   If the spirometer includes an indicator to show your best effort, your nurse or respiratory therapist will set it to a desired goal.  If possible, sit up straight or lean slightly forward. Try not to slouch.  Hold the incentive spirometer in an upright position. INSTRUCTIONS FOR USE   Sit on the edge of your bed if possible, or sit up as far as you can in bed or on a chair.  Hold the incentive spirometer in an upright position.  Breathe out normally.  Place the mouthpiece in your mouth and seal your lips tightly around it.  Breathe in slowly and as deeply as possible, raising the piston or the ball toward the top of the column.  Hold your breath for 3-5 seconds or for as long as possible. Allow the piston or ball to fall to the bottom of the column.  Remove the mouthpiece from your mouth and breathe out normally.  Rest for a few seconds and repeat Steps 1 through 7 at least 10 times every 1-2 hours when you are awake. Take your time and take a few normal breaths between deep breaths.  The spirometer may include an indicator to show your best effort. Use the indicator as a goal to work toward during each repetition.  After each set of 10 deep breaths, practice coughing to be sure your lungs are clear. If you have an incision (the cut made at the time of surgery), support your incision when coughing by placing a pillow or rolled up towels firmly against it. Once you are able to get out of bed, walk around indoors and cough well. You may stop using the incentive spirometer when instructed by your caregiver.  RISKS AND COMPLICATIONS  Take your time so you do not get dizzy or light-headed.  If you are in pain, you may need to take or ask for pain medication before doing incentive spirometry. It is harder to take a deep breath if you are having pain. AFTER USE  Rest and breathe slowly and easily.  It can  be helpful to keep track of a log of your progress. Your caregiver can provide you with a simple table to help with this. If you are using the spirometer at home, follow these instructions: [place] IF:   You are having difficultly using the spirometer.  You have trouble using the spirometer as often as instructed.  Your pain medication is not giving enough relief while using the spirometer.  You develop fever of 100.5 F (38.1 C) or higher. SEEK IMMEDIATE MEDICAL CARE IF:   You cough up bloody sputum that had not been present before.  You develop fever of 102 F (38.9 C) or greater.  You develop worsening pain at or near the incision site. MAKE SURE YOU:   Understand these instructions.  Will watch your condition.  Will get help right away if you are not doing well or get worse. Document Released: 05/29/2006 Document Revised: 04/10/2011 Document Reviewed: 07/30/2006 [employer] Patient Information 2014 [employer], [place].   ________________________________________________________________________  WHAT IS A BLOOD TRANSFUSION? Blood Transfusion Information  A transfusion is the replacement of blood or some of its parts. Blood is made up of multiple cells which provide different functions.  Red blood cells carry oxygen and are used for blood loss replacement.  White blood cells fight against infection.  Platelets control bleeding.  Plasma helps clot blood.  Other blood products are available for specialized needs, such as hemophilia or other clotting disorders. BEFORE THE TRANSFUSION  Who gives blood for transfusions?   Healthy volunteers who are fully evaluated to make sure their blood is safe. This is blood bank blood. Transfusion therapy is the safest it has ever been in the practice of medicine. Before blood is taken from a donor, a complete history is taken to make sure that person has no history of diseases nor engages in risky social behavior (examples are  intravenous drug use or sexual activity with multiple partners). The donor's travel history is screened to minimize risk of transmitting infections, such as malaria. The donated blood is tested for signs of infectious diseases, such as HIV and hepatitis. The blood is then tested to be sure it is compatible with you in order to minimize the chance of a transfusion reaction. If you or a relative donates blood, this is often done in anticipation of surgery and is not appropriate for emergency situations. It takes many days to process the donated blood. RISKS AND COMPLICATIONS Although transfusion therapy is very safe and saves many lives, the main dangers of transfusion include:   Getting an infectious disease.  Developing a transfusion reaction. This is an allergic reaction to something in the blood you were given. Every precaution is taken to prevent this. The decision to have a blood transfusion has been considered carefully by your caregiver before blood is given. Blood is not given unless the benefits outweigh the risks. AFTER THE TRANSFUSION  Right after receiving a blood transfusion, you will usually feel much better and more energetic. This is especially true if your red blood cells have gotten low (anemic). The transfusion raises the level of the red blood cells which carry oxygen, and this usually causes an energy increase.  The nurse administering the transfusion will monitor you carefully for complications. HOME CARE INSTRUCTIONS  No special instructions are needed after a transfusion. You may find your energy is better. Speak with your caregiver about any limitations on activity for underlying diseases you may have. SEEK MEDICAL CARE IF:   Your  condition is not improving after your transfusion.  You develop redness or irritation at the intravenous (IV) site. SEEK IMMEDIATE MEDICAL CARE IF:  Any of the following symptoms occur over the next 12 hours:  Shaking chills.  You have a  temperature by mouth above 102 F (38.9 C), not controlled by medicine.  Chest, back, or muscle pain.  People around you feel you are not acting correctly or are confused.  Shortness of breath or difficulty breathing.  Dizziness and fainting.  You get a rash or develop hives.  You have a decrease in urine output.  Your urine turns a dark color or changes to pink, red, or brown. Any of the following symptoms occur over the next 10 days:  You have a temperature by mouth above 102 F (38.9 C), not controlled by medicine.  Shortness of breath.  Weakness after normal activity.  The white part of the eye turns yellow (jaundice).  You have a decrease in the amount of urine or are urinating less often.  Your urine turns a dark color or changes to pink, red, or brown. Document Released: 01/14/2000 Document Revised: 04/10/2011 Document Reviewed: 09/02/2007 [facility] Patient Information 2014 [place], [place].  _______________________________________________________________________

## 2014-11-09 ENCOUNTER — Encounter (HOSPITAL_COMMUNITY): Payer: Self-pay

## 2014-11-09 ENCOUNTER — Encounter (HOSPITAL_COMMUNITY)
Admission: RE | Admit: 2014-11-09 | Discharge: 2014-11-09 | Disposition: A | Discharge status: Home / Self Care | Payer: 59 | Source: Ambulatory Visit | Attending: Orthopedic Surgery | Admitting: Orthopedic Surgery

## 2014-11-09 DIAGNOSIS — M179 Osteoarthritis of knee, unspecified: Secondary | ICD-10-CM | POA: Insufficient documentation

## 2014-11-09 DIAGNOSIS — Z01818 Encounter for other preprocedural examination: Secondary | ICD-10-CM | POA: Diagnosis not present

## 2014-11-09 HISTORY — DX: Nocturia: R35.1

## 2014-11-09 HISTORY — DX: Sleep disorder, unspecified: G47.9

## 2014-11-09 HISTORY — DX: Personal history of other venous thrombosis and embolism: Z86.718

## 2014-11-09 HISTORY — DX: Other pulmonary embolism without acute cor pulmonale: I26.99

## 2014-11-09 HISTORY — DX: Frequency of micturition: R35.0

## 2014-11-09 LAB — URINALYSIS, ROUTINE W REFLEX MICROSCOPIC
BILIRUBIN URINE: NEGATIVE
Glucose, UA: NEGATIVE mg/dL
HGB URINE DIPSTICK: NEGATIVE
KETONES UR: NEGATIVE mg/dL
Leukocytes, UA: NEGATIVE
NITRITE: NEGATIVE
Protein, ur: NEGATIVE mg/dL
Specific Gravity, Urine: 1.022 (ref 1.005–1.030)
Urobilinogen, UA: 1 mg/dL (ref 0.0–1.0)
pH: 5.5 (ref 5.0–8.0)

## 2014-11-09 LAB — CBC
HCT: 44.5 % (ref 39.0–52.0)
Hemoglobin: 15.4 g/dL (ref 13.0–17.0)
MCH: 32.2 pg (ref 26.0–34.0)
MCHC: 34.6 g/dL (ref 30.0–36.0)
MCV: 93.1 fL (ref 78.0–100.0)
PLATELETS: UNDETERMINED 10*3/uL (ref 150–400)
RBC: 4.78 MIL/uL (ref 4.22–5.81)
RDW: 13.6 % (ref 11.5–15.5)
WBC: 6 10*3/uL (ref 4.0–10.5)

## 2014-11-09 LAB — COMPREHENSIVE METABOLIC PANEL
ALBUMIN: 4.1 g/dL (ref 3.5–5.0)
ALT: 11 U/L — ABNORMAL LOW (ref 17–63)
AST: 14 U/L — AB (ref 15–41)
Alkaline Phosphatase: 64 U/L (ref 38–126)
Anion gap: 8 (ref 5–15)
BUN: 12 mg/dL (ref 6–20)
CHLORIDE: 102 mmol/L (ref 101–111)
CO2: 27 mmol/L (ref 22–32)
Calcium: 9.1 mg/dL (ref 8.9–10.3)
Creatinine, Ser: 0.85 mg/dL (ref 0.61–1.24)
GFR calc Af Amer: >60 mL/min (ref >60)
GLUCOSE: 104 mg/dL — AB (ref 65–99)
POTASSIUM: 4.1 mmol/L (ref 3.5–5.1)
Sodium: 137 mmol/L (ref 135–145)
Total Bilirubin: 0.8 mg/dL (ref 0.3–1.2)
Total Protein: 7.9 g/dL (ref 6.5–8.1)

## 2014-11-09 LAB — PROTIME-INR
INR: 1.08 (ref 0.00–1.49)
PROTHROMBIN TIME: 14.2 s (ref 11.6–15.2)

## 2014-11-09 LAB — APTT: APTT: 31 s (ref 24–37)

## 2014-11-09 LAB — SURGICAL PCR SCREEN
MRSA, PCR: POSITIVE — AB
Staphylococcus aureus: POSITIVE — AB

## 2014-11-09 NOTE — Progress Notes (Signed)
+  PCR - MRSA and STAPH faxed to [facility] called to [facility] notified

## 2014-11-12 ENCOUNTER — Other Ambulatory Visit: Payer: Self-pay | Admitting: Radiology

## 2014-11-13 ENCOUNTER — Other Ambulatory Visit: Payer: Self-pay | Admitting: Radiology

## 2014-11-16 ENCOUNTER — Encounter (HOSPITAL_COMMUNITY): Payer: Self-pay

## 2014-11-16 ENCOUNTER — Ambulatory Visit (HOSPITAL_COMMUNITY)
Admission: RE | Admit: 2014-11-16 | Discharge: 2014-11-16 | Disposition: A | Discharge status: Home / Self Care | Payer: Medicare Other | Source: Ambulatory Visit | Attending: Neurology | Admitting: Neurology

## 2014-11-16 ENCOUNTER — Other Ambulatory Visit: Payer: Self-pay | Admitting: Physician Assistant

## 2014-11-16 ENCOUNTER — Ambulatory Visit (HOSPITAL_COMMUNITY)
Admission: RE | Admit: 2014-11-16 | Discharge: 2014-11-16 | Disposition: A | Discharge status: Home / Self Care | Payer: Medicare Other | Source: Ambulatory Visit | Attending: Orthopedic Surgery | Admitting: Orthopedic Surgery

## 2014-11-16 DIAGNOSIS — Z86718 Personal history of other venous thrombosis and embolism: Secondary | ICD-10-CM | POA: Diagnosis not present

## 2014-11-16 DIAGNOSIS — M1990 Unspecified osteoarthritis, unspecified site: Secondary | ICD-10-CM | POA: Insufficient documentation

## 2014-11-16 DIAGNOSIS — Z298 Encounter for other specified prophylactic measures: Secondary | ICD-10-CM | POA: Insufficient documentation

## 2014-11-16 DIAGNOSIS — Z7901 Long term (current) use of anticoagulants: Secondary | ICD-10-CM | POA: Diagnosis not present

## 2014-11-16 DIAGNOSIS — I2699 Other pulmonary embolism without acute cor pulmonale: Secondary | ICD-10-CM | POA: Insufficient documentation

## 2014-11-16 DIAGNOSIS — F17210 Nicotine dependence, cigarettes, uncomplicated: Secondary | ICD-10-CM | POA: Diagnosis not present

## 2014-11-16 DIAGNOSIS — Z86711 Personal history of pulmonary embolism: Secondary | ICD-10-CM | POA: Diagnosis not present

## 2014-11-16 DIAGNOSIS — N400 Enlarged prostate without lower urinary tract symptoms: Secondary | ICD-10-CM | POA: Diagnosis not present

## 2014-11-16 HISTORY — PX: IVC FILTER PLACEMENT (ARMC HX): HXRAD1551

## 2014-11-16 LAB — BASIC METABOLIC PANEL
ANION GAP: 8 (ref 5–15)
BUN: 15 mg/dL (ref 6–20)
CHLORIDE: 107 mmol/L (ref 101–111)
CO2: 25 mmol/L (ref 22–32)
Calcium: 9.1 mg/dL (ref 8.9–10.3)
Creatinine, Ser: 0.92 mg/dL (ref 0.61–1.24)
GFR calc non Af Amer: >60 mL/min (ref >60)
Glucose, Bld: 109 mg/dL — ABNORMAL HIGH (ref 65–99)
POTASSIUM: 3.9 mmol/L (ref 3.5–5.1)
SODIUM: 140 mmol/L (ref 135–145)

## 2014-11-16 LAB — PROTIME-INR
INR: 1.02 (ref 0.00–1.49)
Prothrombin Time: 13.6 seconds (ref 11.6–15.2)

## 2014-11-16 LAB — APTT: aPTT: 30 seconds (ref 24–37)

## 2014-11-16 LAB — CBC
HEMATOCRIT: 41.9 % (ref 39.0–52.0)
HEMOGLOBIN: 14.6 g/dL (ref 13.0–17.0)
MCH: 32.2 pg (ref 26.0–34.0)
MCHC: 34.8 g/dL (ref 30.0–36.0)
MCV: 92.3 fL (ref 78.0–100.0)
PLATELETS: UNDETERMINED 10*3/uL (ref 150–400)
RBC: 4.54 MIL/uL (ref 4.22–5.81)
RDW: 13.7 % (ref 11.5–15.5)
WBC: 5.1 10*3/uL (ref 4.0–10.5)

## 2014-11-16 MED ORDER — IOHEXOL 300 MG/ML  SOLN
30.0000 mL | Freq: Once | INTRAMUSCULAR | Status: DC | PRN
  Administered 2014-11-16: 30 mL via INTRAVENOUS
  Filled 2014-11-16: qty 30

## 2014-11-16 MED ORDER — MIDAZOLAM HCL 2 MG/2ML IJ SOLN
INTRAMUSCULAR | Status: AC
  Filled 2014-11-16: qty 6

## 2014-11-16 MED ORDER — LIDOCAINE HCL 1 % IJ SOLN
INTRAMUSCULAR | Status: AC
  Filled 2014-11-16: qty 20

## 2014-11-16 MED ORDER — IOHEXOL 300 MG/ML  SOLN
25.0000 mL | Freq: Once | INTRAMUSCULAR | Status: DC | PRN
  Administered 2014-11-16: 25 mL via INTRAVENOUS
  Filled 2014-11-16: qty 30

## 2014-11-16 MED ORDER — SODIUM CHLORIDE 0.9 % IV SOLN
Freq: Once | INTRAVENOUS | Status: AC
  Administered 2014-11-16: 09:00:00 via INTRAVENOUS

## 2014-11-16 MED ORDER — MIDAZOLAM HCL 2 MG/2ML IJ SOLN
INTRAMUSCULAR | Status: AC | PRN
  Administered 2014-11-16 (×2): 1 mg via INTRAVENOUS

## 2014-11-16 MED ORDER — FENTANYL CITRATE (PF) 100 MCG/2ML IJ SOLN
INTRAMUSCULAR | Status: AC
  Filled 2014-11-16: qty 4

## 2014-11-16 MED ORDER — FENTANYL CITRATE (PF) 100 MCG/2ML IJ SOLN
INTRAMUSCULAR | Status: AC | PRN
  Administered 2014-11-16: 50 ug via INTRAVENOUS

## 2014-11-16 NOTE — Discharge Instructions (Signed)
Moderate Conscious Sedation, Adult Sedation is the use of medicines to promote relaxation and relieve discomfort and anxiety. Moderate conscious sedation is a type of sedation. Under moderate conscious sedation you are less alert than normal but are still able to respond to instructions or stimulation. Moderate conscious sedation is used during short medical and dental procedures. It is milder than deep sedation or general anesthesia and allows you to return to your regular activities sooner. LET [facility] CARE PROVIDER KNOW ABOUT:   Any allergies you have.  All medicines you are taking, including vitamins, herbs, eye drops, creams, and over-the-counter medicines.  Use of steroids (by mouth or creams).  Previous problems you or members of your family have had with the use of anesthetics.  Any blood disorders you have.  Previous surgeries you have had.  Medical conditions you have.  Possibility of pregnancy, if this applies.  Use of cigarettes, alcohol, or illegal drugs. RISKS AND COMPLICATIONS Generally, this is a safe procedure. However, as with any procedure, problems can occur. Possible problems include:  Oversedation.  Trouble breathing on your own. You may need to have a breathing tube until you are awake and breathing on your own.  Allergic reaction to any of the medicines used for the procedure. BEFORE THE PROCEDURE  You may have blood tests done. These tests can help show how well your kidneys and liver are working. They can also show how well your blood clots.  A physical exam will be done.  Only take medicines as directed by your health care provider. You may need to stop taking medicines (such as blood thinners, aspirin, or nonsteroidal anti-inflammatory drugs) before the procedure.   Do not eat or drink at least 6 hours before the procedure or as directed by your health care provider.  Arrange for a responsible adult, family member, or friend to take you home  after the procedure. He or she should stay with you for at least 24 hours after the procedure, until the medicine has worn off. PROCEDURE   An intravenous (IV) catheter will be inserted into one of your veins. Medicine will be able to flow directly into your body through this catheter. You may be given medicine through this tube to help prevent pain and help you relax.  The medical or dental procedure will be done. AFTER THE PROCEDURE  You will stay in a recovery area until the medicine has worn off. Your blood pressure and pulse will be checked.   Depending on the procedure you had, you may be allowed to go home when you can tolerate liquids and your pain is under control.   This information is not intended to replace advice given to you by your health care provider. Make sure you discuss any questions you have with your health care provider.   Document Released: 10/11/2000 Document Revised: 02/06/2014 Document Reviewed: 09/23/2012 Elsevier Interactive Patient Education 2016 [employer] Inferior Vena Cava Filter Insertion Insertion of an inferior vena cava (IVC) filter is a procedure in which a filter is placed into the large vein in your abdomen that carries blood from the lower part of your body to your heart (inferior vena cava). Placement of the filter here helps prevent blood clots in the legs or pelvis from traveling to your lungs. A large blood clot in the lungs can cause death.  The filter is a small, metal device about an inch long. It is shaped like the spokes of an umbrella and is inserted through a  pathway created in your neck or groin. The risks of this procedure are usually small and easily managed. Inferior vena cava filters are only used when blood thinners (anticoagulants) cannot be used to prevent blood clots from forming. This may occur because of:  You have severe platelet problems or shortage.  You have had recent or current major bleeding that cannot be  treated.  You have bleeding associated with anticoagulants.  You have recurrence of blood clots while on anticoagulants.  You have a need for surgery in the near future.  You have bleeding in your head. EXPECTATIONS OF A FILTER  An IVC filter will reduce the risk of a large blood clot making its way to your lungs (pulmonary embolus, or PE). It cannot eliminate the risk completely, or prevent small PEs from occurring.  It does not stop blood clots from growing, recurring, or developing into postphlebitic syndrome. This is a condition that can occur after there is inflammation in a vein (phlebitis). LET [facility] CARE PROVIDER KNOW ABOUT:  Any allergies you have. This includes an allergy to iodine or contrast dye.  All medicines you are taking, including vitamins, herbs, eye drops, creams and over-the-counter medicines.  Previous problems you or members of your family have had with the use of anesthetics.  Any blood disorders you have.  Previous surgeries you had had.  Medical conditions you have.  Possibility of pregnancy, if this applies. RISKS AND COMPLICATIONS Generally, this is a safe procedure. However, as with any procedure, problems can occur. Possible problems include:  A small bruise around the needle insertion site. A larger pooling of blood called a hematoma may form. This is usually of no concern.  The filter can block the vena cava. This can cause some swelling of the legs.  The filter may eventually fail and not work properly.  Continued bleeding or infections (uncommon).  Damage to the vein by the catheter (rare). BEFORE THE PROCEDURE  Your health care provider may want you to have blood tests. These tests can help tell how well your kidneys and liver are working. They can also show how well your blood clots.  If you take blood thinners, ask your health care provider if and when you should stop taking them.  Do not eat or drink for 4 hours before the  procedure or as directed by your health care provider.  Make arrangements for someone to drive you home. Depending on the procedure, you may be able to go home the same day.  PROCEDURE   Insertion of a Vena Cava filter is performed by a vascular surgeon or cardiologist in a cardiac catheterization lab.  The procedure usually takes about 30 minutes to 1 hour. This can vary.  An IV needle will be inserted into one of your veins. Medicine will be able to flow directly into your body through this needle.  Medicines may given to help you relax and relieve anxiety (sedative).  The procedure is done through a large vein either in your neck or groin. The skin around this area is cleaned and shaved, as necessary.  The skin and deeper tissues over the vein will be made numb with a local anesthetic. You are awake during the procedure and can let your health care providers know if you have discomfort.  A needle is then put into the vein. A guide wire is placed through the needle and into the vein. This is used to help insert a catheter and the IVC filter into  your vein.  Contrast dye may be injected into the inferior vena cava to help guide the catheter and verify precise placement of the IVC filter. X-ray equipment may also be used to verify that the catheter and the wire are in the correct position.  The wire is then withdrawn.  The IVC filter is then passed over the catheter into the vein, and inserted into the correct location in the vena cava.  The catheter is then removed. Pressure will be kept on the needle insertion point for several minutes or until it is unlikely to bleed. AFTER THE PROCEDURE  You will stay in a recovery area until any sedation medicine you were given has worn off. Your blood pressure and pulse will be checked.  If there are no problems, you should be able to go home after the procedure.  You may feel sore at the area of the needle insertion for a few days.   This  information is not intended to replace advice given to you by your health care provider. Make sure you discuss any questions you have with your health care provider.  Inferior Vena Cava Filter Insertion, Care After Refer to this sheet in the next few weeks. These instructions provide you with information on caring for yourself after your procedure. Your health care provider may also give you more specific instructions. Your treatment has been planned according to current medical practices, but problems sometimes occur. Call your health care provider if you have any problems or questions after your procedure. WHAT TO EXPECT AFTER THE PROCEDURE After your procedure, it is typical to have the following:  Mild pain in the area where the filter was inserted.  Mild bruising in the area where the filter was inserted. HOME CARE INSTRUCTIONS  You will be given medicine to control pain. Only take over-the-counter or prescription medicines for pain, fever, or discomfort as directed by your health care provider.  A bandage (dressing) has been placed over the insertion site. Follow your health care provider's instructions on how to care for it.  Keep the insertion site clean and dry.  Do not soak in a bath tub or pool until the filter insertion site has healed.  Do not drive if you are taking narcotic pain medicines. Follow your health care provider's instructions about driving.  Do not return to work or school until your health care provider says it is okay.   Keep all follow-up appointments.  SEEK IMMEDIATE MEDICAL CARE IF:  You develop swelling and discoloration or pain in the legs.  Your legs become pale and cold or blue.  You develop shortness of breath, feel faint, or pass out.  You develop chest pain, a cough, or difficulty breathing.  You cough up blood.  You develop a rash or feel you are having problems that may be a side effect of medicines.  You develop weakness, difficulty  moving your arms or legs, or balance problems.  You develop problems with speech or vision.   This information is not intended to replace advice given to you by your health care provider. Make sure you discuss any questions you have with your health care provider.  May remove dressing and shower 24 to 48 hours after procedure.  Keep site clean and dry.  Moderate Conscious Sedation, Adult, Care After Refer to this sheet in the next few weeks. These instructions provide you with information on caring for yourself after your procedure. Your health care provider may also give you more  specific instructions. Your treatment has been planned according to current medical practices, but problems sometimes occur. Call your health care provider if you have any problems or questions after your procedure. WHAT TO EXPECT AFTER THE PROCEDURE  After your procedure:  You may feel sleepy, clumsy, and have poor balance for several hours.  Vomiting may occur if you eat too soon after the procedure. HOME CARE INSTRUCTIONS  Do not participate in any activities where you could become injured for at least 24 hours. Do not:  Drive.  Swim.  Ride a bicycle.  Operate heavy machinery.  Cook.  Use power tools.  Climb ladders.  Work from a high place.  Do not make important decisions or sign legal documents until you are improved.  If you vomit, drink water, juice, or soup when you can drink without vomiting. Make sure you have little or no nausea before eating solid foods.  Only take over-the-counter or prescription medicines for pain, discomfort, or fever as directed by your health care provider.  Make sure you and your family fully understand everything about the medicines given to you, including what side effects may occur.  You should not drink alcohol, take sleeping pills, or take medicines that cause drowsiness for at least 24 hours.  If you smoke, do not smoke without supervision.  If you  are feeling better, you may resume normal activities 24 hours after you were sedated.  Keep all appointments with your health care provider. SEEK MEDICAL CARE IF:  Your skin is pale or bluish in color.  You continue to feel nauseous or vomit.  Your pain is getting worse and is not helped by medicine.  You have bleeding or swelling.  You are still sleepy or feeling clumsy after 24 hours. SEEK IMMEDIATE MEDICAL CARE IF:  You develop a rash.  You have difficulty breathing.  You develop any type of allergic problem.  You have a fever. MAKE SURE YOU:  Understand these instructions.  Will watch your condition.  Will get help right away if you are not doing well or get worse.   This information is not intended to replace advice given to you by your health care provider. Make sure you discuss any questions you have with your health care provider.   Document Released: 11/06/2012 Document Revised: 02/06/2014 Document Reviewed: 11/06/2012 Elsevier Interactive Patient Education 2016 [employer] Released: 11/06/2012 Document Reviewed: 11/06/2012 Elsevier Interactive Patient Education 2016 [employer] Released: 03/08/2005 Document Revised: 02/06/2014 Document Reviewed: 09/23/2012 Elsevier Interactive Patient Education [employer].

## 2014-11-16 NOTE — Procedures (Signed)
Successful IVC FILTER INSERTION NO COMP STABLE FULL REPORT IN PACS

## 2014-11-16 NOTE — H&P (Signed)
Chief Complaint: Patient was seen in consultation today for IVC filter placement Referring Physician(s): Swinteck,Brian  History of Present Illness: [name] [name] is a 74 y.o. male with history of RLL PE /LLE DVT 03/01/14 and now with end stage arthritis of left knee on schedule for left total knee arthroplasty 11/17/14. He presents today for IVC filter placement preop.   Past Medical History  Diagnosis Date  . BPH (benign prostatic hypertrophy)   . Osteoarthritis   . Lumbar disc disease   . Nocturia   . Frequency of urination   . Difficulty sleeping   . History of DVT (deep vein thrombosis) JAN 2016  . PE (pulmonary embolism) JAN 2016    Past Surgical History  Procedure Laterality Date  . Lumbar laminectomy    . Intramedullary (im) nail intertrochanteric Left 01/22/2014    Procedure: INTRAMEDULLARY (IM) NAIL INTERTROCHANTRIC;  Surgeon: Melina Schools, MD;  Location: [place];  Service: Orthopedics;  Laterality: Left;    Allergies: Review of patient's allergies indicates no known allergies.  Medications: Prior to Admission medications   Medication Sig Start Date End Date Taking? Authorizing Provider  mupirocin nasal ointment (BACTROBAN) 2 % Place 1 application into the nose 2 (two) times daily. Use one-half of tube in each nostril twice daily for five (5) days. After application, press sides of nose together and gently massage.   Yes Historical Provider, MD  acetaminophen (TYLENOL) 325 MG tablet Take 2 tablets (650 mg total) by mouth every 6 (six) hours as needed for moderate pain (headache). Patient not taking: Reported on 10/30/2014 03/03/14   Geradine Girt, DO  oxyCODONE (OXY IR/ROXICODONE) 5 MG immediate release tablet Take one to two tablets by mouth every 4 hours as needed for pain Patient not taking: Reported on 10/30/2014 03/09/14   Lauree Chandler, NP  Rivaroxaban (XARELTO) 15 MG TABS tablet Take 1 tablet (15 mg total) by mouth 2 (two) times daily with a meal. For 35  more doses Patient not taking: Reported on 10/30/2014 03/07/14   Estill Dooms, MD  rivaroxaban Alveda Reasons) 20 MG TABS tablet First dose on Tue 03/24/14 at 1700 Patient not taking: Reported on 10/30/2014 03/07/14   Estill Dooms, MD     History reviewed. No pertinent family history.  Social History   Social History  . Marital Status: Married    Spouse Name: N/A  . Number of Children: N/A  . Years of Education: N/A   Social History Main Topics  . Smoking status: Current Every Day Smoker -- 0.25 packs/day    Types: Cigarettes  . Smokeless tobacco: Never Used  . Alcohol Use: No  . Drug Use: No  . Sexual Activity: No   Other Topics Concern  . None   Social History Narrative     Review of Systems  Constitutional: Negative for fever and chills.  Respiratory: Negative for cough and shortness of breath.   Cardiovascular: Negative for chest pain.  Gastrointestinal: Negative for nausea, vomiting, abdominal pain and blood in stool.  Genitourinary: Negative for dysuria and hematuria.       Nocturia  Musculoskeletal: Negative for back pain.       Left knee pain  Neurological: Negative for headaches.    Vital Signs: BP 156/81 mmHg  Pulse 80  Temp(Src) 97.9 F (36.6 C) (Oral)  Resp 16  SpO2 97%  Physical Exam  Constitutional: He is oriented to person, place, and time. He appears well-developed and well-nourished.  Cardiovascular: Normal  rate and regular rhythm.   Pulmonary/Chest: Effort normal.  Distant BS bilat  Abdominal: Soft. Bowel sounds are normal. There is no tenderness.  Musculoskeletal: He exhibits no edema.  Decreased ROM left knee  Neurological: He is alert and oriented to person, place, and time.    Mallampati Score:     Imaging: No results found.  Labs:  CBC:  Recent Labs  03/03/14 0318 03/03/14 1238 11/09/14 1120 11/16/14 0920  WBC 5.5 6.5 6.0 5.1  HGB 12.2* 12.2* 15.4 14.6  HCT 36.4* 35.9* 44.5 41.9  PLT PLATELET CLUMPS NOTED ON SMEAR, UNABLE  TO ESTIMATE PLATELET CLUMPS NOTED ON SMEAR, UNABLE TO ESTIMATE PLATELET CLUMPS NOTED ON SMEAR, UNABLE TO ESTIMATE PENDING    COAGS:  Recent Labs  01/22/14 1321  03/02/14 0903 03/02/14 1444 11/09/14 1120 11/16/14 0920  INR 1.03  --   --  1.63* 1.08 1.02  APTT  --   < > 43* 49* 31 30  < > = values in this interval not displayed.  BMP:  Recent Labs  02/28/14 1035 02/28/14 1631 03/01/14 2300 03/03/14 0318 11/09/14 1120  NA 137  --  136 138 137  K 3.9  --  3.8 4.0 4.1  CL 104  --  104 104 102  CO2 27  --  28 25 27   GLUCOSE 141*  --  106* 102* 104*  BUN 13  --  12 10 12   CALCIUM 8.7  --  9.1 8.9 9.1  CREATININE 0.93 0.79 0.88 0.88 0.85  GFRNONAA 81* 86* 83* 83* >60  GFRAA >90 >90 >90 >90 >60    LIVER FUNCTION TESTS:  Recent Labs  01/22/14 1321 11/09/14 1120  BILITOT 0.4 0.8  AST 15 14*  ALT 13 11*  ALKPHOS 49 64  PROT 7.5 7.9  ALBUMIN 3.8 4.1    TUMOR MARKERS: No results for input(s): AFPTM, CEA, CA199, CHROMGRNA in the last 8760 hours.  Assessment and Plan: [name] [name] is a 74 y.o. male with history of RLL PE /LLE DVT 03/01/14 and now with end stage arthritis of left knee on schedule for left total knee arthroplasty 11/17/14. He presents today for IVC filter placement preop. Risks and benefits discussed with the patient including, but not limited to bleeding, infection, contrast induced renal failure, filter fracture or migration which can lead to emergency surgery or even death, strut penetration with damage or irritation to adjacent structures and caval thrombosis.All of the patient's questions were answered, patient is agreeable to proceed. Consent signed and in chart.     Thank you for this interesting consult.  I greatly enjoyed meeting [name] [name] and look forward to participating in their care.  A copy of this report was sent to the requesting provider on this date.  Signed: D. Rowe Robert 11/16/2014, 10:03 AM   I spent a total of 15 minutes  in face to face in clinical consultation, greater than 50% of which was counseling/coordinating care for IVC filter placement

## 2014-11-17 ENCOUNTER — Encounter (HOSPITAL_COMMUNITY): Payer: Self-pay

## 2014-11-17 ENCOUNTER — Inpatient Hospital Stay (HOSPITAL_COMMUNITY)
Admission: RE | Admit: 2014-11-17 | Discharge: 2014-11-19 | DRG: 470 | Disposition: A | Discharge status: Home Health | Payer: 59 | Source: Ambulatory Visit | Attending: Orthopedic Surgery | Admitting: Orthopedic Surgery

## 2014-11-17 ENCOUNTER — Inpatient Hospital Stay (HOSPITAL_COMMUNITY): Payer: 59

## 2014-11-17 ENCOUNTER — Inpatient Hospital Stay (HOSPITAL_COMMUNITY): Payer: 59 | Admitting: Certified Registered"

## 2014-11-17 ENCOUNTER — Encounter (HOSPITAL_COMMUNITY)
Admission: RE | Disposition: A | Discharge status: Home Health | Payer: Self-pay | Source: Ambulatory Visit | Attending: Orthopedic Surgery

## 2014-11-17 DIAGNOSIS — Z96649 Presence of unspecified artificial hip joint: Secondary | ICD-10-CM | POA: Diagnosis present

## 2014-11-17 DIAGNOSIS — Z86718 Personal history of other venous thrombosis and embolism: Secondary | ICD-10-CM

## 2014-11-17 DIAGNOSIS — Z09 Encounter for follow-up examination after completed treatment for conditions other than malignant neoplasm: Secondary | ICD-10-CM

## 2014-11-17 DIAGNOSIS — M1712 Unilateral primary osteoarthritis, left knee: Secondary | ICD-10-CM | POA: Diagnosis present

## 2014-11-17 DIAGNOSIS — Z79899 Other long term (current) drug therapy: Secondary | ICD-10-CM

## 2014-11-17 DIAGNOSIS — Z01812 Encounter for preprocedural laboratory examination: Secondary | ICD-10-CM | POA: Diagnosis not present

## 2014-11-17 DIAGNOSIS — Z86711 Personal history of pulmonary embolism: Secondary | ICD-10-CM

## 2014-11-17 DIAGNOSIS — M25562 Pain in left knee: Secondary | ICD-10-CM | POA: Diagnosis present

## 2014-11-17 DIAGNOSIS — F17200 Nicotine dependence, unspecified, uncomplicated: Secondary | ICD-10-CM | POA: Diagnosis present

## 2014-11-17 HISTORY — PX: KNEE ARTHROPLASTY: SHX992

## 2014-11-17 LAB — TYPE AND SCREEN
ABO/RH(D): A POS
ANTIBODY SCREEN: NEGATIVE

## 2014-11-17 SURGERY — ARTHROPLASTY, KNEE, TOTAL, USING IMAGELESS COMPUTER-ASSISTED NAVIGATION
Anesthesia: Spinal | Site: Knee | Laterality: Left

## 2014-11-17 MED ORDER — VANCOMYCIN HCL 1000 MG IV SOLR: 1000.0000 mg | Freq: Once | INTRAVENOUS | Status: DC

## 2014-11-17 MED ORDER — PHENYLEPHRINE 40 MCG/ML (10ML) SYRINGE FOR IV PUSH (FOR BLOOD PRESSURE SUPPORT)
PREFILLED_SYRINGE | INTRAVENOUS | Status: AC
  Filled 2014-11-17: qty 10

## 2014-11-17 MED ORDER — HYDROGEN PEROXIDE 3 % EX SOLN
CUTANEOUS | Status: AC
  Filled 2014-11-17: qty 473

## 2014-11-17 MED ORDER — PROMETHAZINE HCL 25 MG/ML IJ SOLN: 6.2500 mg | INTRAMUSCULAR | Status: DC | PRN

## 2014-11-17 MED ORDER — PROPOFOL 10 MG/ML IV BOLUS
INTRAVENOUS | Status: AC
  Filled 2014-11-17: qty 20

## 2014-11-17 MED ORDER — BUPIVACAINE-EPINEPHRINE 0.25% -1:200000 IJ SOLN
INTRAMUSCULAR | Status: AC
  Filled 2014-11-17: qty 1

## 2014-11-17 MED ORDER — BUPIVACAINE LIPOSOME 1.3 % IJ SUSP
20.0000 mL | Freq: Once | INTRAMUSCULAR | Status: DC
  Filled 2014-11-17: qty 20

## 2014-11-17 MED ORDER — SODIUM CHLORIDE 0.9 % IJ SOLN
INTRAMUSCULAR | Status: AC
  Filled 2014-11-17: qty 50

## 2014-11-17 MED ORDER — KETOROLAC TROMETHAMINE 15 MG/ML IJ SOLN
15.0000 mg | Freq: Four times a day (QID) | INTRAMUSCULAR | Status: AC
  Administered 2014-11-17 – 2014-11-18 (×2): 15 mg via INTRAVENOUS
  Filled 2014-11-17 (×3): qty 1

## 2014-11-17 MED ORDER — APIXABAN 2.5 MG PO TABS
2.5000 mg | ORAL_TABLET | Freq: Two times a day (BID) | ORAL | Status: DC
  Administered 2014-11-18 – 2014-11-19 (×3): 2.5 mg via ORAL
  Filled 2014-11-17 (×5): qty 1

## 2014-11-17 MED ORDER — ROCURONIUM BROMIDE 100 MG/10ML IV SOLN
INTRAVENOUS | Status: AC
  Filled 2014-11-17: qty 1

## 2014-11-17 MED ORDER — DEXTROSE 5 % IV SOLN
500.0000 mg | Freq: Four times a day (QID) | INTRAVENOUS | Status: DC | PRN
  Administered 2014-11-17: 500 mg via INTRAVENOUS
  Filled 2014-11-17 (×2): qty 5

## 2014-11-17 MED ORDER — DOCUSATE SODIUM 100 MG PO CAPS
100.0000 mg | ORAL_CAPSULE | Freq: Two times a day (BID) | ORAL | Status: DC
  Administered 2014-11-17 – 2014-11-19 (×3): 100 mg via ORAL

## 2014-11-17 MED ORDER — PROPOFOL 500 MG/50ML IV EMUL
INTRAVENOUS | Status: DC | PRN
  Administered 2014-11-17: 75 ug/kg/min via INTRAVENOUS

## 2014-11-17 MED ORDER — CHLORHEXIDINE GLUCONATE 4 % EX LIQD: 60.0000 mL | Freq: Once | CUTANEOUS | Status: DC

## 2014-11-17 MED ORDER — BUPIVACAINE HCL (PF) 0.5 % IJ SOLN
INTRAMUSCULAR | Status: DC | PRN
  Administered 2014-11-17: 2.5 mL via INTRATHECAL

## 2014-11-17 MED ORDER — BISOPROLOL FUMARATE 5 MG PO TABS
5.0000 mg | ORAL_TABLET | Freq: Once | ORAL | Status: DC
  Filled 2014-11-17: qty 1

## 2014-11-17 MED ORDER — MENTHOL 3 MG MT LOZG: 1.0000 | LOZENGE | OROMUCOSAL | Status: DC | PRN

## 2014-11-17 MED ORDER — METOCLOPRAMIDE HCL 10 MG PO TABS: 5.0000 mg | ORAL_TABLET | Freq: Three times a day (TID) | ORAL | Status: DC | PRN

## 2014-11-17 MED ORDER — PHENOL 1.4 % MT LIQD: 1.0000 | OROMUCOSAL | Status: DC | PRN

## 2014-11-17 MED ORDER — SODIUM CHLORIDE 0.9 % IV SOLN
INTRAVENOUS | Status: DC
  Administered 2014-11-17 – 2014-11-18 (×2): via INTRAVENOUS

## 2014-11-17 MED ORDER — ACETAMINOPHEN 650 MG RE SUPP: 650.0000 mg | Freq: Four times a day (QID) | RECTAL | Status: DC | PRN

## 2014-11-17 MED ORDER — BUPIVACAINE HCL (PF) 0.25 % IJ SOLN
INTRAMUSCULAR | Status: AC
  Filled 2014-11-17: qty 30

## 2014-11-17 MED ORDER — BUPIVACAINE-EPINEPHRINE 0.25% -1:200000 IJ SOLN
INTRAMUSCULAR | Status: DC | PRN
  Administered 2014-11-17: 30 mL

## 2014-11-17 MED ORDER — HYDROCODONE-ACETAMINOPHEN 5-325 MG PO TABS
1.0000 | ORAL_TABLET | ORAL | Status: DC | PRN
  Administered 2014-11-17: 2 via ORAL
  Administered 2014-11-17 (×2): 1 via ORAL
  Administered 2014-11-18 – 2014-11-19 (×7): 2 via ORAL
  Filled 2014-11-17 (×7): qty 2
  Filled 2014-11-17: qty 1
  Filled 2014-11-17: qty 2
  Filled 2014-11-17: qty 1

## 2014-11-17 MED ORDER — MIDAZOLAM HCL 2 MG/2ML IJ SOLN
INTRAMUSCULAR | Status: DC | PRN
  Administered 2014-11-17: 2 mg via INTRAVENOUS

## 2014-11-17 MED ORDER — SODIUM CHLORIDE 0.9 % IJ SOLN
INTRAMUSCULAR | Status: DC | PRN
  Administered 2014-11-17: 30 mL

## 2014-11-17 MED ORDER — TRANEXAMIC ACID 1000 MG/10ML IV SOLN
2000.0000 mg | INTRAVENOUS | Status: DC
  Filled 2014-11-17: qty 20

## 2014-11-17 MED ORDER — SODIUM CHLORIDE 0.9 % IR SOLN
Status: DC | PRN
  Administered 2014-11-17: 1

## 2014-11-17 MED ORDER — CEFAZOLIN SODIUM-DEXTROSE 2-3 GM-% IV SOLR
INTRAVENOUS | Status: AC
  Filled 2014-11-17: qty 50

## 2014-11-17 MED ORDER — VANCOMYCIN HCL IN DEXTROSE 1-5 GM/200ML-% IV SOLN
1000.0000 mg | Freq: Once | INTRAVENOUS | Status: AC
  Administered 2014-11-17: 1000 mg via INTRAVENOUS

## 2014-11-17 MED ORDER — POVIDONE-IODINE 10 % EX SOLN
CUTANEOUS | Status: DC | PRN
  Administered 2014-11-17: 1 via TOPICAL

## 2014-11-17 MED ORDER — ONDANSETRON HCL 4 MG/2ML IJ SOLN: 4.0000 mg | Freq: Four times a day (QID) | INTRAMUSCULAR | Status: DC | PRN

## 2014-11-17 MED ORDER — DEXTROSE 5 % IV SOLN
10.0000 mg | INTRAVENOUS | Status: DC | PRN
  Administered 2014-11-17: 25 ug/min via INTRAVENOUS

## 2014-11-17 MED ORDER — ACETAMINOPHEN 325 MG PO TABS: 650.0000 mg | ORAL_TABLET | Freq: Four times a day (QID) | ORAL | Status: DC | PRN

## 2014-11-17 MED ORDER — VANCOMYCIN HCL IN DEXTROSE 1-5 GM/200ML-% IV SOLN
INTRAVENOUS | Status: AC
  Filled 2014-11-17: qty 200

## 2014-11-17 MED ORDER — ALUM & MAG HYDROXIDE-SIMETH 200-200-20 MG/5ML PO SUSP
30.0000 mL | ORAL | Status: DC | PRN
Start: 2014-11-17 — End: 2014-11-19

## 2014-11-17 MED ORDER — PHENYLEPHRINE HCL 10 MG/ML IJ SOLN
INTRAMUSCULAR | Status: DC | PRN
  Administered 2014-11-17 (×6): 80 ug via INTRAVENOUS

## 2014-11-17 MED ORDER — FENTANYL CITRATE (PF) 100 MCG/2ML IJ SOLN: 25.0000 ug | INTRAMUSCULAR | Status: DC | PRN

## 2014-11-17 MED ORDER — SODIUM CHLORIDE 0.9 % IV SOLN: INTRAVENOUS | Status: DC

## 2014-11-17 MED ORDER — KETOROLAC TROMETHAMINE 30 MG/ML IJ SOLN
INTRAMUSCULAR | Status: AC
  Filled 2014-11-17: qty 1

## 2014-11-17 MED ORDER — ONDANSETRON HCL 4 MG/2ML IJ SOLN
INTRAMUSCULAR | Status: AC
  Filled 2014-11-17: qty 2

## 2014-11-17 MED ORDER — MEPERIDINE HCL 50 MG/ML IJ SOLN: 6.2500 mg | INTRAMUSCULAR | Status: DC | PRN

## 2014-11-17 MED ORDER — KETAMINE HCL 10 MG/ML IJ SOLN
INTRAMUSCULAR | Status: AC
  Filled 2014-11-17: qty 1

## 2014-11-17 MED ORDER — ACETAMINOPHEN 10 MG/ML IV SOLN
INTRAVENOUS | Status: AC
  Filled 2014-11-17: qty 100

## 2014-11-17 MED ORDER — PHENYLEPHRINE HCL 10 MG/ML IJ SOLN
INTRAMUSCULAR | Status: AC
  Filled 2014-11-17: qty 2

## 2014-11-17 MED ORDER — SENNA 8.6 MG PO TABS
2.0000 | ORAL_TABLET | Freq: Every day | ORAL | Status: DC
  Administered 2014-11-17 – 2014-11-18 (×2): 17.2 mg via ORAL

## 2014-11-17 MED ORDER — LABETALOL HCL 5 MG/ML IV SOLN
INTRAVENOUS | Status: AC
  Filled 2014-11-17: qty 4

## 2014-11-17 MED ORDER — HYDROGEN PEROXIDE 3 % EX SOLN
CUTANEOUS | Status: DC | PRN
  Administered 2014-11-17: 1

## 2014-11-17 MED ORDER — DIPHENHYDRAMINE HCL 12.5 MG/5ML PO ELIX
12.5000 mg | ORAL_SOLUTION | ORAL | Status: DC | PRN
  Administered 2014-11-18: 25 mg via ORAL
  Filled 2014-11-17: qty 10

## 2014-11-17 MED ORDER — LACTATED RINGERS IV SOLN
INTRAVENOUS | Status: DC | PRN
  Administered 2014-11-17 (×2): via INTRAVENOUS

## 2014-11-17 MED ORDER — METHOCARBAMOL 500 MG PO TABS
500.0000 mg | ORAL_TABLET | Freq: Four times a day (QID) | ORAL | Status: DC | PRN
  Administered 2014-11-18 – 2014-11-19 (×3): 500 mg via ORAL
  Filled 2014-11-17 (×3): qty 1

## 2014-11-17 MED ORDER — CEFAZOLIN SODIUM-DEXTROSE 2-3 GM-% IV SOLR
2.0000 g | INTRAVENOUS | Status: AC
  Administered 2014-11-17: 2 g via INTRAVENOUS

## 2014-11-17 MED ORDER — HYDROMORPHONE HCL 1 MG/ML IJ SOLN
0.5000 mg | INTRAMUSCULAR | Status: DC | PRN
  Administered 2014-11-19: 0.5 mg via INTRAVENOUS
  Filled 2014-11-17: qty 1

## 2014-11-17 MED ORDER — VANCOMYCIN HCL IN DEXTROSE 1-5 GM/200ML-% IV SOLN
1000.0000 mg | Freq: Two times a day (BID) | INTRAVENOUS | Status: AC
  Administered 2014-11-17: 1000 mg via INTRAVENOUS
  Filled 2014-11-17: qty 200

## 2014-11-17 MED ORDER — ONDANSETRON HCL 4 MG/2ML IJ SOLN
INTRAMUSCULAR | Status: DC | PRN
  Administered 2014-11-17: 4 mg via INTRAVENOUS

## 2014-11-17 MED ORDER — DEXAMETHASONE SODIUM PHOSPHATE 10 MG/ML IJ SOLN
INTRAMUSCULAR | Status: DC | PRN
  Administered 2014-11-17: 10 mg via INTRAVENOUS

## 2014-11-17 MED ORDER — FENTANYL CITRATE (PF) 250 MCG/5ML IJ SOLN
INTRAMUSCULAR | Status: DC | PRN
  Administered 2014-11-17: 50 ug via INTRAVENOUS

## 2014-11-17 MED ORDER — DEXAMETHASONE SODIUM PHOSPHATE 10 MG/ML IJ SOLN
INTRAMUSCULAR | Status: AC
  Filled 2014-11-17: qty 1

## 2014-11-17 MED ORDER — FENTANYL CITRATE (PF) 250 MCG/5ML IJ SOLN
INTRAMUSCULAR | Status: AC
  Filled 2014-11-17: qty 25

## 2014-11-17 MED ORDER — ONDANSETRON HCL 4 MG PO TABS: 4.0000 mg | ORAL_TABLET | Freq: Four times a day (QID) | ORAL | Status: DC | PRN

## 2014-11-17 MED ORDER — MIDAZOLAM HCL 2 MG/2ML IJ SOLN
INTRAMUSCULAR | Status: AC
  Filled 2014-11-17: qty 4

## 2014-11-17 MED ORDER — ISOPROPYL ALCOHOL 70 % SOLN
Status: AC
  Filled 2014-11-17: qty 480

## 2014-11-17 MED ORDER — ISOPROPYL ALCOHOL (RUBBING) 70 % SOLN
Status: DC | PRN
  Administered 2014-11-17: 50 mL via TOPICAL

## 2014-11-17 MED ORDER — DEXAMETHASONE SODIUM PHOSPHATE 10 MG/ML IJ SOLN
10.0000 mg | Freq: Once | INTRAMUSCULAR | Status: DC
  Filled 2014-11-17: qty 1

## 2014-11-17 MED ORDER — ACETAMINOPHEN 10 MG/ML IV SOLN
1000.0000 mg | Freq: Once | INTRAVENOUS | Status: AC
  Administered 2014-11-17: 1000 mg via INTRAVENOUS

## 2014-11-17 MED ORDER — METOCLOPRAMIDE HCL 5 MG/ML IJ SOLN: 5.0000 mg | Freq: Three times a day (TID) | INTRAMUSCULAR | Status: DC | PRN

## 2014-11-17 MED ORDER — LIDOCAINE HCL (CARDIAC) 20 MG/ML IV SOLN
INTRAVENOUS | Status: AC
  Filled 2014-11-17: qty 5

## 2014-11-17 MED ORDER — KETOROLAC TROMETHAMINE 30 MG/ML IJ SOLN
INTRAMUSCULAR | Status: DC | PRN
  Administered 2014-11-17: 30 mg via INTRAVENOUS

## 2014-11-17 SURGICAL SUPPLY — 71 items
BAG SPEC THK2 15X12 ZIP CLS (MISCELLANEOUS) ×1
BAG ZIPLOCK 12X15 (MISCELLANEOUS) ×3 IMPLANT
BANDAGE ELASTIC 4 VELCRO ST LF (GAUZE/BANDAGES/DRESSINGS) ×3 IMPLANT
BANDAGE ELASTIC 6 VELCRO ST LF (GAUZE/BANDAGES/DRESSINGS) ×3 IMPLANT
BANDAGE ESMARK 6X9 LF (GAUZE/BANDAGES/DRESSINGS) ×1 IMPLANT
BLADE SAW RECIPROCATING 77.5 (BLADE) ×3 IMPLANT
BNDG CMPR 9X6 STRL LF SNTH (GAUZE/BANDAGES/DRESSINGS) ×1
BNDG ESMARK 6X9 LF (GAUZE/BANDAGES/DRESSINGS) ×3
BONE CEMENT SIMPLEX TOBRAMYCIN (Cement) ×9 IMPLANT
CAPT KNEE TOTAL 3 ×2 IMPLANT
CEMENT BONE SIMPLEX TOBRAMYCIN (Cement) ×3 IMPLANT
CHLORAPREP W/TINT 26ML (MISCELLANEOUS) ×6 IMPLANT
CUFF TOURN SGL QUICK 34 (TOURNIQUET CUFF) ×3
CUFF TRNQT CYL 34X4X40X1 (TOURNIQUET CUFF) ×1 IMPLANT
DECANTER SPIKE VIAL GLASS SM (MISCELLANEOUS) ×6 IMPLANT
DRAPE EXTREMITY T 121X128X90 (DRAPE) ×3 IMPLANT
DRAPE POUCH INSTRU U-SHP 10X18 (DRAPES) ×3 IMPLANT
DRAPE SHEET LG 3/4 BI-LAMINATE (DRAPES) ×6 IMPLANT
DRAPE U-SHAPE 47X51 STRL (DRAPES) ×3 IMPLANT
DRSG ADAPTIC 3X8 NADH LF (GAUZE/BANDAGES/DRESSINGS) ×3 IMPLANT
DRSG AQUACEL AG ADV 3.5X10 (GAUZE/BANDAGES/DRESSINGS) ×3 IMPLANT
DRSG TEGADERM 4X4.75 (GAUZE/BANDAGES/DRESSINGS) ×3 IMPLANT
ELECT PENCIL ROCKER SW 15FT (MISCELLANEOUS) ×3 IMPLANT
ELECT REM PT RETURN 9FT ADLT (ELECTROSURGICAL) ×3
ELECTRODE REM PT RTRN 9FT ADLT (ELECTROSURGICAL) ×1 IMPLANT
EVACUATOR 1/8 PVC DRAIN (DRAIN) ×3 IMPLANT
FACESHIELD WRAPAROUND (MASK) ×12 IMPLANT
FACESHIELD WRAPAROUND OR TEAM (MASK) ×4 IMPLANT
GAUZE SPONGE 4X4 12PLY STRL (GAUZE/BANDAGES/DRESSINGS) ×3 IMPLANT
GLOVE BIO SURGEON STRL SZ8.5 (GLOVE) ×6 IMPLANT
GLOVE BIOGEL PI IND STRL 8.5 (GLOVE) ×1 IMPLANT
GLOVE BIOGEL PI INDICATOR 8.5 (GLOVE) ×2
GOWN SPEC L3 XXLG W/TWL (GOWN DISPOSABLE) ×3 IMPLANT
HANDPIECE INTERPULSE COAX TIP (DISPOSABLE) ×3
HOOD PEEL AWAY FACE SHEILD DIS (HOOD) ×6 IMPLANT
IMMOBILIZER KNEE 20 (SOFTGOODS) ×3
IMMOBILIZER KNEE 20 THIGH 36 (SOFTGOODS) IMPLANT
KIT BASIN OR (CUSTOM PROCEDURE TRAY) ×3 IMPLANT
LIQUID BAND (GAUZE/BANDAGES/DRESSINGS) ×4 IMPLANT
NDL SPNL 18GX3.5 QUINCKE PK (NEEDLE) ×1 IMPLANT
NEEDLE SPNL 18GX3.5 QUINCKE PK (NEEDLE) ×3 IMPLANT
NS IRRIG 1000ML POUR BTL (IV SOLUTION) ×3 IMPLANT
PACK TOTAL JOINT (CUSTOM PROCEDURE TRAY) ×3 IMPLANT
PADDING CAST COTTON 6X4 STRL (CAST SUPPLIES) ×3 IMPLANT
PEN SKIN MARKING BROAD (MISCELLANEOUS) ×3 IMPLANT
POSITIONER SURGICAL ARM (MISCELLANEOUS) ×3 IMPLANT
SAW OSC TIP CART 19.5X105X1.3 (SAW) ×3 IMPLANT
SEALER BIPOLAR AQUA 6.0 (INSTRUMENTS) ×3 IMPLANT
SET HNDPC FAN SPRY TIP SCT (DISPOSABLE) ×1 IMPLANT
SET PAD KNEE POSITIONER (MISCELLANEOUS) ×3 IMPLANT
SOL PREP POV-IOD 4OZ 10% (MISCELLANEOUS) ×3 IMPLANT
SPONGE DRAIN TRACH 4X4 STRL 2S (GAUZE/BANDAGES/DRESSINGS) ×3 IMPLANT
SPONGE LAP 18X18 X RAY DECT (DISPOSABLE) ×3 IMPLANT
SUCTION FRAZIER 12FR DISP (SUCTIONS) ×3 IMPLANT
SUT MNCRL AB 3-0 PS2 18 (SUTURE) ×3 IMPLANT
SUT MON AB 2-0 CT1 36 (SUTURE) ×6 IMPLANT
SUT VIC AB 1 CT1 27 (SUTURE) ×9
SUT VIC AB 1 CT1 27XBRD ANTBC (SUTURE) ×3 IMPLANT
SUT VIC AB 1 CT1 36 (SUTURE) ×3 IMPLANT
SUT VIC AB 2-0 CT1 27 (SUTURE) ×3
SUT VIC AB 2-0 CT1 TAPERPNT 27 (SUTURE) ×1 IMPLANT
SUT VLOC 180 0 24IN GS25 (SUTURE) ×5 IMPLANT
SYR 50ML LL SCALE MARK (SYRINGE) ×3 IMPLANT
TOWEL OR 17X26 10 PK STRL BLUE (TOWEL DISPOSABLE) ×6 IMPLANT
TOWEL OR NON WOVEN STRL DISP B (DISPOSABLE) ×3 IMPLANT
TOWER CARTRIDGE SMART MIX (DISPOSABLE) ×3 IMPLANT
TRAY FOLEY W/METER SILVER 14FR (SET/KITS/TRAYS/PACK) ×1 IMPLANT
TRAY FOLEY W/METER SILVER 16FR (SET/KITS/TRAYS/PACK) ×3 IMPLANT
WATER STERILE IRR 1500ML POUR (IV SOLUTION) ×3 IMPLANT
WRAP KNEE MAXI GEL POST OP (GAUZE/BANDAGES/DRESSINGS) ×3 IMPLANT
YANKAUER SUCT BULB TIP 10FT TU (MISCELLANEOUS) ×3 IMPLANT

## 2014-11-17 NOTE — H&P (View-Only) (Signed)
TOTAL KNEE ADMISSION H&P  Patient is being admitted for left total knee arthroplasty.  Subjective:  Chief Complaint:left knee pain.  HPI: [name] [name], 74 y.o. male, has a history of pain and functional disability in the left knee due to arthritis and has failed non-surgical conservative treatments for greater than 12 weeks to includeNSAID's and/or analgesics, corticosteriod injections, flexibility and strengthening excercises, use of assistive devices, weight reduction as appropriate and activity modification.  Onset of symptoms was gradual, starting 5 years ago with gradually worsening course since that time. The patient noted previous left hip fracture nail on the left knee(s).  Patient currently rates pain in the left knee(s) at 10 out of 10 with activity. Patient has night pain, worsening of pain with activity and weight bearing, pain that interferes with activities of daily living, pain with passive range of motion and joint swelling.  Patient has evidence of subchondral cysts, subchondral sclerosis, periarticular osteophytes and joint space narrowing by imaging studies.There is no active infection.  Patient Active Problem List   Diagnosis Date Noted  . Constipation 03/19/2014  . DVT (deep venous thrombosis) 03/03/2014  . Acute pulmonary embolism 03/03/2014  . Syncopal episodes 02/28/2014  . Hip fracture requiring operative repair 01/22/2014  . Hip fracture, left 01/22/2014  . BPH (benign prostatic hypertrophy) 01/22/2014  . Hip fracture 01/22/2014   Past Medical History  Diagnosis Date  . BPH (benign prostatic hypertrophy)   . Osteoarthritis   . Lumbar disc disease     Past Surgical History  Procedure Laterality Date  . Lumbar laminectomy    . Intramedullary (im) nail intertrochanteric Left 01/22/2014    Procedure: INTRAMEDULLARY (IM) NAIL INTERTROCHANTRIC;  Surgeon: Melina Schools, MD;  Location: [place];  Service: Orthopedics;  Laterality: Left;     (Not in a hospital  admission) No Known Allergies  Social History  Substance Use Topics  . Smoking status: Current Every Day Smoker -- 1.50 packs/day    Types: Cigarettes  . Smokeless tobacco: Never Used  . Alcohol Use: No    No family history on file.   Review of Systems  Constitutional: Negative.   HENT: Negative.   Eyes: Negative.   Respiratory: Negative.   Cardiovascular: Negative.   Gastrointestinal: Negative.   Genitourinary: Negative.   Musculoskeletal: Positive for joint pain.  Skin: Negative.   Neurological: Negative.   Endo/Heme/Allergies: Negative.   Psychiatric/Behavioral: Negative.     Objective:  Physical Exam  Constitutional: He is oriented to person, place, and time. He appears well-developed and well-nourished.  HENT:  Head: Normocephalic and atraumatic.  Eyes: EOM are normal. Pupils are equal, round, and reactive to light.  Neck: Normal range of motion. Neck supple.  Cardiovascular: Normal rate, regular rhythm, normal heart sounds and intact distal pulses.   Respiratory: Effort normal and breath sounds normal.  GI: Soft. Bowel sounds are normal. He exhibits no distension. There is no tenderness.  Genitourinary:  deferred  Musculoskeletal:       Left knee: He exhibits decreased range of motion, swelling, effusion and deformity. Tenderness found. Medial joint line and lateral joint line tenderness noted.  Neurological: He is alert and oriented to person, place, and time. He has normal reflexes.  Skin: Skin is warm and dry.  Psychiatric: He has a normal mood and affect. His behavior is normal. Judgment and thought content normal.    Vital signs in last 24 hours: @VSRANGES @  Labs:   Estimated body mass index is 27.66 kg/(m^2) as calculated from the  following:   Height as of 03/04/14: 6' (1.829 m).   Weight as of 03/08/14: 92.534 kg (204 lb).   Imaging Review Plain radiographs demonstrate severe degenerative joint disease of the left knee(s). The overall alignment  ismild varus. The bone quality appears to be adequate for age and reported activity level.  Assessment/Plan:  End stage arthritis, left knee   The patient history, physical examination, clinical judgment of the provider and imaging studies are consistent with end stage degenerative joint disease of the left knee(s) and total knee arthroplasty is deemed medically necessary. The treatment options including medical management, injection therapy arthroscopy and arthroplasty were discussed at length. The risks and benefits of total knee arthroplasty were presented and reviewed. The risks due to aseptic loosening, infection, stiffness, patella tracking problems, thromboembolic complications and other imponderables were discussed. The patient acknowledged the explanation, agreed to proceed with the plan and consent was signed. Patient is being admitted for inpatient treatment for surgery, pain control, PT, OT, prophylactic antibiotics, VTE prophylaxis, progressive ambulation and ADL's and discharge planning. The patient is planning to be discharged home with home health services

## 2014-11-17 NOTE — Evaluation (Signed)
Physical Therapy Evaluation Patient Details Name: [name] [name] MRN: [id number] DOB: 1941-01-02 Today's Date: 11/17/2014   History of Present Illness  s/p L TKA  Clinical Impression  Pt admitted with above diagnosis. Pt currently with functional limitations due to the deficits listed below (see PT Problem List).  Pt will benefit from skilled PT to increase their independence and safety with mobility to allow discharge to the venue listed below. Pt doing very well today (post-op day 0), expect excellent progress; planning for HHPT     Follow Up Recommendations Home health PT    Equipment Recommendations  None recommended by PT    Recommendations for Other Services       Precautions / Restrictions Precautions Precautions: Knee;Fall Restrictions Other Position/Activity Restrictions: WBAT      Mobility  Bed Mobility Overal bed mobility: Needs Assistance Bed Mobility: Supine to Sit     Supine to sit: Supervision     General bed mobility comments: for lines and safety  Transfers Overall transfer level: Needs assistance Equipment used: Rolling walker (2 wheeled) Transfers: Sit to/from Stand Sit to Stand: Min assist         General transfer comment: cues for hand placement  Ambulation/Gait Ambulation/Gait assistance: Min assist;Min guard Ambulation Distance (Feet): 70 Feet Assistive device: Rolling walker (2 wheeled) Gait Pattern/deviations: Step-to pattern;Step-through pattern     General Gait Details: cues for RW safety and RW distance from self  Stairs            Wheelchair Mobility    Modified Rankin (Stroke Patients Only)       Balance Overall balance assessment: Needs assistance         Standing balance support: Bilateral upper extremity supported Standing balance-Leahy Scale: Poor                               Pertinent Vitals/Pain Pain Assessment: 0-10 Pain Score: 4  Pain Location: L knee Pain Descriptors /  Indicators: Grimacing;Aching Pain Intervention(s): Limited activity within patient's tolerance;Monitored during session;Premedicated before session;Repositioned    Home Living Family/patient expects to be discharged to:: Private residence Living Arrangements: Spouse/significant other Available Help at Discharge: Available PRN/intermittently Type of Home: House Home Access: Stairs to enter Entrance Stairs-Rails: Right;Left;Can reach both Technical brewer of Steps: 3-4 Home Layout: Two level Home Equipment: Environmental consultant - 2 wheels;Crutches Additional Comments: pt planning to sleep in recliner down stairs    Prior Function Level of Independence: Independent with assistive device(s)   Gait / Transfers Assistance Needed: amb with one crutch           Hand Dominance        Extremity/Trunk Assessment   Upper Extremity Assessment: Defer to OT evaluation           Lower Extremity Assessment: LLE deficits/detail   LLE Deficits / Details: knee AROM 10-65*; knee extension and hip flexion grossly 3/5; ankle WFL     Communication   Communication: No difficulties  Cognition Arousal/Alertness: Awake/alert Behavior During Therapy: WFL for tasks assessed/performed Overall Cognitive Status: Within Functional Limits for tasks assessed                      General Comments      Exercises Total Joint Exercises Ankle Circles/Pumps: AROM;Both;10 reps Heel Slides: AROM;Left;5 reps      Assessment/Plan    PT Assessment Patient needs continued PT services  PT Diagnosis Difficulty walking  PT Problem List Decreased strength;Decreased range of motion;Decreased activity tolerance;Decreased mobility;Decreased knowledge of use of DME;Pain;Decreased knowledge of precautions  PT Treatment Interventions DME instruction;Gait training;Functional mobility training;Therapeutic activities;Therapeutic exercise;Patient/family education;Stair training   PT Goals (Current goals can be  found in the Care Plan section) Acute Rehab PT Goals Patient Stated Goal: to get back to his motorcycle [employer] PT Goal Formulation: With patient Time For Goal Achievement: 11/20/14 Potential to Achieve Goals: Good    Frequency 7X/week   Barriers to discharge        Co-evaluation               End of Session Equipment Utilized During Treatment: Gait belt Activity Tolerance: Patient tolerated treatment well             Time: 1715-1735 PT Time Calculation (min) (ACUTE ONLY): 20 min   Charges:   PT Evaluation $Initial PT Evaluation Tier I: 1 Procedure     PT G CodesKenyon Ana December 06, 2014, 5:35 PM

## 2014-11-17 NOTE — Anesthesia Preprocedure Evaluation (Addendum)
Anesthesia Evaluation  Patient identified by MRN, date of birth, ID band Patient awake    Reviewed: Allergy & Precautions, NPO status , Patient's Chart, lab work & pertinent test results  Airway Mallampati: II  TM Distance: >3 FB Neck ROM: Full    Dental no notable dental hx. (+) Edentulous Upper, Edentulous Lower   Pulmonary Current Smoker, PE   Pulmonary exam normal breath sounds clear to auscultation       Cardiovascular negative cardio ROS Normal cardiovascular exam Rhythm:Regular Rate:Normal     Neuro/Psych negative neurological ROS  negative psych ROS   GI/Hepatic negative GI ROS, Neg liver ROS,   Endo/Other  negative endocrine ROS  Renal/GU negative Renal ROS  negative genitourinary   Musculoskeletal negative musculoskeletal ROS (+)   Abdominal   Peds negative pediatric ROS (+)  Hematology negative hematology ROS (+)   Anesthesia Other Findings   Reproductive/Obstetrics negative OB ROS                            Anesthesia Physical Anesthesia Plan  ASA: II  Anesthesia Plan: Spinal   Post-op Pain Management:    Induction:   Airway Management Planned: Simple Face Mask  Additional Equipment:   Intra-op Plan:   Post-operative Plan:   Informed Consent: I have reviewed the patients History and Physical, chart, labs and discussed the procedure including the risks, benefits and alternatives for the proposed anesthesia with the patient or authorized representative who has indicated his/her understanding and acceptance.   Dental advisory given  Plan Discussed with: CRNA  Anesthesia Plan Comments:         Anesthesia Quick Evaluation

## 2014-11-17 NOTE — Addendum Note (Signed)
Addendum  created 11/17/14 1415 by Pilar Grammes, CRNA   Modules edited: Anesthesia Flowsheet

## 2014-11-17 NOTE — Transfer of Care (Signed)
Immediate Anesthesia Transfer of Care Note  Patient: [name] [name]  Procedure(s) Performed: Procedure(s): COMPUTER ASSISTED TOTAL LEFT KNEE ARTHROPLASTY (Left)  Patient Location: PACU  Anesthesia Type:Spinal  Level of Consciousness: alert  and oriented  Airway & Oxygen Therapy: Patient connected to face mask oxygen  Post-op Assessment: Post -op Vital signs reviewed and stable  Post vital signs: stable  Last Vitals:  Filed Vitals:   11/17/14 0524  BP: 123/79  Pulse: 77  Temp: 36.4 C  Resp: 18    Complications: No apparent anesthesia complications

## 2014-11-17 NOTE — Op Note (Signed)
OPERATIVE REPORT  SURGEON: Rod Can, MD   ASSISTANT: Roberto Scales, RNFA.  PREOPERATIVE DIAGNOSIS: Left knee arthritis.   POSTOPERATIVE DIAGNOSIS: Left knee arthritis.   PROCEDURE: Left total knee arthroplasty.   IMPLANTS: Stryker Triathlon CR femur, size 6. Stryker universal tibia, size 6. X3 polyethelyene insert, size 9 mm, CR. 3 button asymmetric patella, size 40 mm. Simplex P bone cement.  ANESTHESIA:  Spinal  ESTIMATED BLOOD LOSS: 150 mL.  ANTIBIOTICS: 1g vancomycin.  DRAINS: Medium HV x1.  COMPLICATIONS: None   CONDITION: PACU - hemodynamically stable.   BRIEF CLINICAL NOTE: [name] [name] is a 74 y.o. male with a long-standing history of Left knee arthritis. After failing conservative management, the patient was indicated for total knee arthroplasty. The risks, benefits, and alternatives to the procedure were explained, and the patient elected to proceed.  PROCEDURE IN DETAIL: Spinal anesthesia was obtained in the pre-op holding area. Once inside the operative room, a foley catheter was inserted. The patient was then positioned, a nonsterile tourniquet was placed, and the lower extremity was prepped and draped in the normal sterile surgical fashion. A time-out was called verifying side and site of surgery. The patient received IV antibiotics within 60 minutes of beginning the procedure. The extremity was exsanguinated with an Esmarch, and the tourniquet was inflated.  An anterior approach to the knee was performed utilizing a medial parapatellar arthrotomy. A medial release was performed and the patellar fat pad was excised. Stryker navigation was used to cut the distal femur perpendicular to the mechanical axis. A freehand patellar resection was performed, and the patella was sized an prepared with 3 lug holes.  Nagivation was used to make a neutral proximal  tibia resection, taking 9 mm of bone from the less affected lateral side with 3 degrees of slope. The menisci were excised. A spacer block was placed, and the alignment and balance in extension were confirmed.   The distal femur was sized using the 3-degree external rotation guide referencing the posterior femoral cortex. The appropriate 4-in-1 cutting block was pinned into place. Rotation was checked using Whiteside's line, the epicondylar axis, and then confirmed with a spacer block in flexion. The remaining femoral cuts were performed, taking care to protect the MCL.  The tibia was sized and the trial tray was pinned into place. The remaining trail components were inserted. The knee was stable to varus and valgus stress through a full range of motion. The patella tracked centrally, and the PCL was well balanced. The trial components were removed, and the proximal tibial surface was prepared. Small drill holes were made in the sclerotic subchondral bone.The cut bony surfaces were irrigated with pulse lavage. Final components were cemented into place and excess cement was cleared. The trial insert was placed, and the knee was brought into extension while the cement polymerized. Once the cement was hard, the knee was tested for a final time and found to be well balanced. The trial insert was exchanged for the real polyethylene insert.  The wound was copiously irrigated with a dilute betadine solution followed by normal saline with pulse lavage. Marcaine solution was injected into the periarticular soft tissue. The wound was closed in layers using #1 Vicryl and V-Loc for the fascia, 2-0 Vicryl for the subcutaneous fat, 2-0 Monocryl for the deep dermal layer, 3-0 running Monocryl subcuticular Stitch, and Dermabond for the skin. Once the glue was fully dried, an Aquacell Ag and compressive dressing were applied. The tourniquet was let down, and the patient  was transported to the recovery room in stable  ondition. Sponge, needle, and instrument counts were correct at the end of the case x2. The patient tolerated the procedure well and there were no known complications.

## 2014-11-17 NOTE — Anesthesia Procedure Notes (Signed)
Spinal Patient location during procedure: OR Staffing Anesthesiologist: Montez Hageman Performed by: anesthesiologist  Preanesthetic Checklist Completed: patient identified, site marked, surgical consent, pre-op evaluation, timeout performed, IV checked, risks and benefits discussed and monitors and equipment checked Spinal Block Patient position: sitting Prep: Betadine Patient monitoring: heart rate, continuous pulse ox and blood pressure Approach: left paramedian Location: L3-4 Injection technique: single-shot Needle Needle type: Spinocan  Needle gauge: 22 G Needle length: 9 cm Additional Notes Difficult placement. 3 attempts. Very calcific ligaments.   Expiration date of kit checked and confirmed. Patient tolerated procedure well, without complications.

## 2014-11-17 NOTE — Interval H&P Note (Signed)
History and Physical Interval Note:  11/17/2014 7:07 AM  [name] [name]  has presented today for surgery, with the diagnosis of LEFT KNEE OA   The various methods of treatment have been discussed with the patient and family. After consideration of risks, benefits and other options for treatment, the patient has consented to  Procedure(s): COMPUTER ASSISTED TOTAL LEFT KNEE ARTHROPLASTY (Left) as a surgical intervention .  The patient's history has been reviewed, patient examined, no change in status, stable for surgery.  I have reviewed the patient's chart and labs.  Questions were answered to the patient's satisfaction.     Melonee Gerstel, Horald Pollen

## 2014-11-17 NOTE — Anesthesia Postprocedure Evaluation (Signed)
  Anesthesia Post-op Note  Patient: [name] [name]  Procedure(s) Performed: Procedure(s) (LRB): COMPUTER ASSISTED TOTAL LEFT KNEE ARTHROPLASTY (Left)  Patient Location: PACU  Anesthesia Type: Spinal  Level of Consciousness: awake and alert   Airway and Oxygen Therapy: Patient Spontanous Breathing  Post-op Pain: mild  Post-op Assessment: Post-op Vital signs reviewed, Patient's Cardiovascular Status Stable, Respiratory Function Stable, Patent Airway and No signs of Nausea or vomiting  Last Vitals:  Filed Vitals:   11/17/14 1345  BP: 121/74  Pulse: 70  Temp: 36.6 C  Resp: 16    Post-op Vital Signs: stable   Complications: No apparent anesthesia complications

## 2014-11-18 ENCOUNTER — Encounter (HOSPITAL_COMMUNITY): Payer: Self-pay | Admitting: Orthopedic Surgery

## 2014-11-18 LAB — CBC
HEMATOCRIT: 34.6 % — AB (ref 39.0–52.0)
HEMOGLOBIN: 11.8 g/dL — AB (ref 13.0–17.0)
MCH: 31.6 pg (ref 26.0–34.0)
MCHC: 34.1 g/dL (ref 30.0–36.0)
MCV: 92.5 fL (ref 78.0–100.0)
Platelets: 189 10*3/uL (ref 150–400)
RBC: 3.74 MIL/uL — ABNORMAL LOW (ref 4.22–5.81)
RDW: 13.7 % (ref 11.5–15.5)
WBC: 8.2 10*3/uL (ref 4.0–10.5)

## 2014-11-18 LAB — BASIC METABOLIC PANEL
ANION GAP: 7 (ref 5–15)
BUN: 17 mg/dL (ref 6–20)
CALCIUM: 8.4 mg/dL — AB (ref 8.9–10.3)
CO2: 25 mmol/L (ref 22–32)
Chloride: 102 mmol/L (ref 101–111)
Creatinine, Ser: 0.98 mg/dL (ref 0.61–1.24)
GFR calc non Af Amer: >60 mL/min (ref >60)
Glucose, Bld: 121 mg/dL — ABNORMAL HIGH (ref 65–99)
Potassium: 4.1 mmol/L (ref 3.5–5.1)
SODIUM: 134 mmol/L — AB (ref 135–145)

## 2014-11-18 MED ORDER — APIXABAN 2.5 MG PO TABS: 2.5000 mg | ORAL_TABLET | Freq: Two times a day (BID) | ORAL | Status: DC

## 2014-11-18 MED ORDER — METHOCARBAMOL 500 MG PO TABS: 500.0000 mg | ORAL_TABLET | Freq: Four times a day (QID) | ORAL | Status: DC | PRN

## 2014-11-18 MED ORDER — ONDANSETRON HCL 4 MG PO TABS: 4.0000 mg | ORAL_TABLET | Freq: Four times a day (QID) | ORAL | Status: DC | PRN

## 2014-11-18 MED ORDER — HYDROCODONE-ACETAMINOPHEN 5-325 MG PO TABS: 1.0000 | ORAL_TABLET | ORAL | Status: DC | PRN

## 2014-11-18 MED ORDER — DOCUSATE SODIUM 100 MG PO CAPS: 100.0000 mg | ORAL_CAPSULE | Freq: Two times a day (BID) | ORAL | Status: DC

## 2014-11-18 MED ORDER — SENNA 8.6 MG PO TABS: 2.0000 | ORAL_TABLET | Freq: Every day | ORAL | Status: DC

## 2014-11-18 NOTE — Progress Notes (Signed)
   Subjective:  Patient reports pain as mild to moderate.  No c/o. Denies N/V/CP/SOB.  Objective:   VITALS:   Filed Vitals:   11/17/14 1539 11/17/14 2225 11/18/14 0214 11/18/14 0450  BP: 124/74 118/62 114/63 125/65  Pulse: 73 68 76 69  Temp: 97.5 F (36.4 C) 98.5 F (36.9 C) 98.9 F (37.2 C) 98.4 F (36.9 C)  TempSrc: Axillary Oral Oral Oral  Resp: 16 16 16 16   Height:      Weight:      SpO2: 97% 94% 96% 94%    ABD soft Sensation intact distally Intact pulses distally Dorsiflexion/Plantar flexion intact Incision: dressing C/D/I Compartment soft   Lab Results  Component Value Date   WBC 8.2 11/18/2014   HGB 11.8* 11/18/2014   HCT 34.6* 11/18/2014   MCV 92.5 11/18/2014   PLT 189 11/18/2014   BMET    Component Value Date/Time   NA 134* 11/18/2014 0538   K 4.1 11/18/2014 0538   CL 102 11/18/2014 0538   CO2 25 11/18/2014 0538   GLUCOSE 121* 11/18/2014 0538   BUN 17 11/18/2014 0538   CREATININE 0.98 11/18/2014 0538   CALCIUM 8.4* 11/18/2014 0538   GFRNONAA >60 11/18/2014 0538   GFRAA >60 11/18/2014 0538     Assessment/Plan: 1 Day Post-Op   Principal Problem:   Primary osteoarthritis of left knee   WBAT with walker D/C HV drain PO pain control DVT ppx: apixaban, foot pumps, TEDs Advance diet Up with therapy D/C IV fluids Discharge home with home health, today vs tomorrow    Elie Goody 11/18/2014, 7:32 AM   Rod Can, MD Cell [phone]

## 2014-11-18 NOTE — Progress Notes (Signed)
Pt with little output after foley discontinuation. 117 ml scanned from bladder scanner. 50 ml urine output. IV fluids started back up and pt encouraged again to drink lots of water. Pt given full pitcher of water and is currently drinking water.

## 2014-11-18 NOTE — Evaluation (Signed)
Occupational Therapy Evaluation Patient Details Name: DESSIE DELCARLO MRN: [id number] DOB: [birth year] Today's Date: 11/18/2014    History of Present Illness s/p L TKA   Clinical Impression   Pt up with OT for toilet transfer and requires verbal cues for safety with walker. He states "I got this" several times during session but yet continues to need verbal cues for safety. Will follow on acute to progress safety and independence with self care tasks.     Follow Up Recommendations  No OT follow up;Supervision/Assistance - 24 hour    Equipment Recommendations  None recommended by OT (pt declines 3in1)    Recommendations for Other Services       Precautions / Restrictions Precautions Precautions: Knee;Fall Restrictions Weight Bearing Restrictions: No      Mobility Bed Mobility Overal bed mobility: Needs Assistance Bed Mobility: Supine to Sit;Sit to Supine     Supine to sit: Supervision Sit to supine: Supervision   General bed mobility comments: supervision for safety.  Transfers Overall transfer level: Needs assistance Equipment used: Rolling walker (2 wheeled) Transfers: Sit to/from Stand Sit to Stand: Min guard;Min assist         General transfer comment: min guard to stand from comfort height commode with bar. min assist to descend to commode. verbal cues for hand placement.     Balance                                            ADL Overall ADL's : Needs assistance/impaired Eating/Feeding: Independent;Sitting   Grooming: Wash/dry hands;Set up;Sitting   Upper Body Bathing: Set up;Sitting   Lower Body Bathing: Minimal assistance;Sit to/from stand   Upper Body Dressing : Set up;Sitting   Lower Body Dressing: Minimal assistance;Sit to/from stand   Toilet Transfer: Minimal assistance;Ambulation;Comfort height toilet;Grab bars   Toileting- Clothing Manipulation and Hygiene: Minimal assistance;Sit to/from stand         General  ADL Comments: Pt repeatedly stated, "I got this" whenever given instructions on safety with tasks but yet tends to use walker unsafely. He tends to push walker too far in front of him and picks up walker. Demonstrated shower transfer technique and discussed use of walker to step back over shower ledge and have seat toward the front of the shower. Pt has all AE and is familiar with use as he used AE prior to surgery for LB dressing. He states wife will be with him at home.      Vision     Perception     Praxis      Pertinent Vitals/Pain Pain Location: didnt rate. stated sore. Pain Descriptors / Indicators: Sore Pain Intervention(s): Repositioned;Ice applied     Hand Dominance     Extremity/Trunk Assessment Upper Extremity Assessment Upper Extremity Assessment: Overall WFL for tasks assessed           Communication Communication Communication: No difficulties   Cognition Arousal/Alertness: Awake/alert Behavior During Therapy: WFL for tasks assessed/performed Overall Cognitive Status: Within Functional Limits for tasks assessed                     General Comments       Exercises       Shoulder Instructions      Home Living Family/patient expects to be discharged to:: Private residence Living Arrangements: Spouse/significant other Available Help at Discharge: Available PRN/intermittently Type  of Home: House Home Access: Stairs to enter [employer] of Steps: 3-4 Entrance Stairs-Rails: Right;Left;Can reach both Home Layout: Two level Alternate Level Stairs-Number of Steps: 14 pt has chair lift   Bathroom Shower/Tub: Occupational psychologist: Handicapped height     Home Equipment: Environmental consultant - 2 wheels;Crutches;Adaptive equipment Adaptive Equipment: Reacher;Sock aid;Long-handled shoe horn;Long-handled sponge Additional Comments: pt planning to sleep in recliner down stairs      Prior Functioning/Environment Level of Independence:  Independent with assistive device(s)  Gait / Transfers Assistance Needed: amb with one crutch          OT Diagnosis: Generalized weakness   OT Problem List: Decreased strength;Decreased knowledge of use of DME or AE   OT Treatment/Interventions: Self-care/ADL training;Patient/family education;Therapeutic activities;DME and/or AE instruction    OT Goals(Current goals can be found in the care plan section) Acute Rehab OT Goals Patient Stated Goal: return to independence.  OT Goal Formulation: With patient Time For Goal Achievement: 11/25/14 Potential to Achieve Goals: Good  OT Frequency: Min 2X/week   Barriers to D/C:            Co-evaluation              End of Session Equipment Utilized During Treatment: Gait belt;Rolling walker  Activity Tolerance: Patient tolerated treatment well Patient left: in bed;with call bell/phone within reach;with bed alarm set (pt declined sitting up in chair.)   Time: 1102-1140 OT Time Calculation (min): 38 min Charges:  OT General Charges $OT Visit: 1 Procedure OT Evaluation $Initial OT Evaluation Tier I: 1 Procedure OT Treatments $Self Care/Home Management : 8-22 mins $Therapeutic Activity: 8-22 mins G-Codes:    Jules Schick  [phone] 11/18/2014, 12:15 PM

## 2014-11-18 NOTE — Progress Notes (Signed)
Utilization review completed.  

## 2014-11-18 NOTE — Progress Notes (Signed)
Physical Therapy Treatment Patient Details Name: SWANSON FARNELL MRN: [id number] DOB: 12-01-1940 Today's Date: 11/18/2014    History of Present Illness s/p L TKA    PT Comments    POD # 1 am session.  Assisted pt OOB to amb in hallway then returned to room to perform TKR TE's followed by ICE.   Follow Up Recommendations  Home health PT     Equipment Recommendations  None recommended by PT    Recommendations for Other Services       Precautions / Restrictions Precautions Precautions: Knee;Fall Restrictions Weight Bearing Restrictions: No Other Position/Activity Restrictions: WBAT    Mobility  Bed Mobility Overal bed mobility: Needs Assistance Bed Mobility: Supine to Sit     Supine to sit: Supervision     General bed mobility comments: supervision for safety.  Transfers Overall transfer level: Needs assistance Equipment used: Rolling walker (2 wheeled) Transfers: Sit to/from Stand Sit to Stand: Min guard         General transfer comment: 25% VC's on proper tech and hand placement as well as L LE advancement  Ambulation/Gait Ambulation/Gait assistance: Min guard;Min assist Ambulation Distance (Feet): 85 Feet Assistive device: Rolling walker (2 wheeled) Gait Pattern/deviations: Step-to pattern;Step-through pattern Gait velocity: WFL   General Gait Details: cues for RW safety and RW distance from self   Stairs            Wheelchair Mobility    Modified Rankin (Stroke Patients Only)       Balance                                    Cognition Arousal/Alertness: Awake/alert Behavior During Therapy: WFL for tasks assessed/performed Overall Cognitive Status: Within Functional Limits for tasks assessed                      Exercises   Total Knee Replacement TE's 10 reps B LE ankle pumps 10 reps towel squeezes 10 reps knee presses 10 reps heel slides  10 reps SAQ's 10 reps SLR's 10 reps ABD Followed by ICE      General Comments        Pertinent Vitals/Pain Pain Assessment: 0-10 Pain Score: 8  Pain Location: L knee Pain Descriptors / Indicators: Grimacing;Sore;Tightness Pain Intervention(s): Monitored during session;Repositioned;Ice applied;Premedicated before session    Home Living                      Prior Function            PT Goals (current goals can now be found in the care plan section) Progress towards PT goals: Progressing toward goals    Frequency       PT Plan Current plan remains appropriate    Co-evaluation             End of Session Equipment Utilized During Treatment: Gait belt Activity Tolerance: Patient tolerated treatment well       Time: 0932-3557 PT Time Calculation (min) (ACUTE ONLY): 25 min  Charges:  $Gait Training: 8-22 mins $Therapeutic Exercise: 8-22 mins                    G Codes:      Rica Koyanagi  PTA [facility]  Acute  Rehab Pager      [phone]

## 2014-11-18 NOTE — Discharge Instructions (Signed)
°Dr. Brian Swinteck °Total Joint Specialist °[facility] °[address] °[place], [place] [address] °[phone] ° °TOTAL KNEE REPLACEMENT POSTOPERATIVE DIRECTIONS ° ° ° °Knee Rehabilitation, Guidelines Following Surgery  °Results after knee surgery are often greatly improved when you follow the exercise, range of motion and muscle strengthening exercises prescribed by your doctor. Safety measures are also important to protect the knee from further injury. Any time any of these exercises cause you to have increased pain or swelling in your knee joint, decrease the amount until you are comfortable again and slowly increase them. If you have problems or questions, call your caregiver or physical therapist for advice.  ° °WEIGHT BEARING °Weight bearing as tolerated with assist device (walker, cane, etc) as directed, use it as long as suggested by your surgeon or therapist, typically at least 4-6 weeks. ° °HOME CARE INSTRUCTIONS  °Remove items at home which could result in a fall. This includes throw rugs or furniture in walking pathways.  °Continue medications as instructed at time of discharge. °You may have some home medications which will be placed on hold until you complete the course of blood thinner medication.  °You may start showering once you are discharged home but do not submerge the incision under water. Just pat the incision dry and apply a dry gauze dressing on daily. °Walk with walker as instructed.  °You may resume a sexual relationship in one month or when given the OK by your doctor.  °· Use walker as long as suggested by your caregivers. °· Avoid periods of inactivity such as sitting longer than an hour when not asleep. This helps prevent blood clots.  °You may put full weight on your legs and walk as much as is comfortable.  °You may return to work once you are cleared by your doctor.  °Do not drive a car for 6 weeks or until released by you surgeon.  °· Do not drive while  taking narcotics.  °Wear the elastic stockings for three weeks following surgery during the day but you may remove then at night. °Make sure you keep all of your appointments after your operation with all of your doctors and caregivers. You should call the office at the above phone number and make an appointment for approximately two weeks after the date of your surgery. °Do not remove your surgical dressing. The dressing is waterproof; you may take showers in 3 days, but do not take tub baths or submerge the dressing. °Please pick up a stool softener and laxative for home use as long as you are requiring pain medications. °· ICE to the affected knee every three hours for 30 minutes at a time and then as needed for pain and swelling.  Continue to use ice on the knee for pain and swelling from surgery. You may notice swelling that will progress down to the foot and ankle.  This is normal after surgery.  Elevate the leg when you are not up walking on it.   °It is important for you to complete the blood thinner medication as prescribed by your doctor. °· Continue to use the breathing machine which will help keep your temperature down.  It is common for your temperature to cycle up and down following surgery, especially at night when you are not up moving around and exerting yourself.  The breathing machine keeps your lungs expanded and your temperature down. ° °RANGE OF MOTION AND STRENGTHENING EXERCISES  °Rehabilitation of the knee is important following a   knee injury or an operation. After just a few days of immobilization, the muscles of the thigh which control the knee become weakened and shrink (atrophy). Knee exercises are designed to build up the tone and strength of the thigh muscles and to improve knee motion. Often times heat used for twenty to thirty minutes before working out will loosen up your tissues and help with improving the range of motion but do not use heat for the first two weeks following  surgery. These exercises can be done on a training (exercise) mat, on the floor, on a table or on a bed. Use what ever works the best and is most comfortable for you Knee exercises include:  Leg Lifts - While your knee is still immobilized in a splint or cast, you can do straight leg raises. Lift the leg to 60 degrees, hold for 3 sec, and slowly lower the leg. Repeat 10-20 times 2-3 times daily. Perform this exercise against resistance later as your knee gets better.  Quad and Hamstring Sets - Tighten up the muscle on the front of the thigh (Quad) and hold for 5-10 sec. Repeat this 10-20 times hourly. Hamstring sets are done by pushing the foot backward against an object and holding for 5-10 sec. Repeat as with quad sets.  A rehabilitation program following serious knee injuries can speed recovery and prevent re-injury in the future due to weakened muscles. Contact your doctor or a physical therapist for more information on knee rehabilitation.   SKILLED REHAB INSTRUCTIONS: If the patient is transferred to a skilled rehab facility following release from the hospital, a list of the current medications will be sent to the facility for the patient to continue.  When discharged from the skilled rehab facility, please have the facility set up the patient's [place] prior to being released. Also, the skilled facility will be responsible for providing the patient with their medications at time of release from the facility to include their pain medication, the muscle relaxants, and their blood thinner medication. If the patient is still at the rehab facility at time of the two week follow up appointment, the skilled rehab facility will also need to assist the patient in arranging follow up appointment in our office and any transportation needs.  MAKE SURE YOU:  Understand these instructions.  Will watch your condition.  Will get help right away if you are not doing well or get worse.     Pick up stool softner and laxative for home use following surgery while on pain medications. Do NOT remove your dressing. You may shower.  Do not take tub baths or submerge incision under water. May shower starting three days after surgery. Please use a clean towel to pat the incision dry following showers. Continue to use ice for pain and swelling after surgery. Do not use any lotions or creams on the incision until instructed by your surgeon.   Information on my medicine - ELIQUIS (apixaban) This medication education was reviewed with me or my healthcare representative as part of my discharge preparation.  The pharmacist that spoke with me during my hospital stay was:  Altha Harm PharmD  Why was Eliquis prescribed for you? Eliquis was prescribed for you to reduce the risk of blood clots forming after orthopedic surgery.    What do You need to know about Eliquis? Take your Eliquis TWICE DAILY - one tablet in the morning and one tablet in the evening with or without food.  It would  be best to take the dose about the same time each day.  If you have difficulty swallowing the tablet whole please discuss with your pharmacist how to take the medication safely.  Take Eliquis exactly as prescribed by your doctor and DO NOT stop taking Eliquis without talking to the doctor who prescribed the medication.  Stopping without other medication to take the place of Eliquis may increase your risk of developing a clot.  After discharge, you should have regular check-up appointments with your healthcare provider that is prescribing your Eliquis.  What do you do if you miss a dose? If a dose of ELIQUIS is not taken at the scheduled time, take it as soon as possible on the same day and twice-daily administration should be resumed.  The dose should not be doubled to make up for a missed dose.  Do not take more than one tablet of ELIQUIS at the same time.  Important Safety Information A  possible side effect of Eliquis is bleeding. You should call your healthcare provider right away if you experience any of the following: ? Bleeding from an injury or your nose that does not stop. ? Unusual colored urine (red or dark brown) or unusual colored stools (red or black). ? Unusual bruising for unknown reasons. ? A serious fall or if you hit your head (even if there is no bleeding).  Some medicines may interact with Eliquis and might increase your risk of bleeding or clotting while on Eliquis. To help avoid this, consult your healthcare provider or pharmacist prior to using any new prescription or non-prescription medications, including herbals, vitamins, non-steroidal anti-inflammatory drugs (NSAIDs) and supplements.  This website has more information on Eliquis (apixaban): http://www.eliquis.com/eliquis/home

## 2014-11-18 NOTE — Progress Notes (Signed)
Physical Therapy Treatment Patient Details Name: [name] [name] MRN: [id number] DOB: 1940/10/18 Today's Date: 11/18/2014    History of Present Illness s/p L TKA    PT Comments    POD # 1 pm session.  Assisted from recliner to amb to BR.  Assisted to amb a greater distance in hallway.  Assisted back to bed and applied ICE to L knee.  Pt plans to D/C to home tomorrow.   Follow Up Recommendations  Home health PT     Equipment Recommendations  None recommended by PT    Recommendations for Other Services       Precautions / Restrictions Precautions Precautions: Knee;Fall Restrictions Weight Bearing Restrictions: No Other Position/Activity Restrictions: WBAT    Mobility  Bed Mobility Overal bed mobility: Needs Assistance Bed Mobility: Supine to Sit     Supine to sit: Supervision Sit to supine: Min guard;Min assist   General bed mobility comments: assisted back to bed  Transfers Overall transfer level: Needs assistance Equipment used: Rolling walker (2 wheeled) Transfers: Sit to/from Stand Sit to Stand: Min guard         General transfer comment: 25% VC's on proper tech and hand placement as well as L LE advancement  Ambulation/Gait Ambulation/Gait assistance: Min guard;Min assist Ambulation Distance (Feet): 105 Feet Assistive device: Rolling walker (2 wheeled) Gait Pattern/deviations: Step-to pattern;Step-through pattern Gait velocity: WFL   General Gait Details: cues for RW safety and RW distance from self   Stairs            Wheelchair Mobility    Modified Rankin (Stroke Patients Only)       Balance                                    Cognition Arousal/Alertness: Awake/alert Behavior During Therapy: WFL for tasks assessed/performed Overall Cognitive Status: Within Functional Limits for tasks assessed                      Exercises      General Comments        Pertinent Vitals/Pain Pain Assessment:  0-10 Pain Score: 8  Pain Location: L knee Pain Descriptors / Indicators: Grimacing;Sore;Tightness Pain Intervention(s): Monitored during session;Repositioned;Ice applied;Premedicated before session    Home Living                      Prior Function            PT Goals (current goals can now be found in the care plan section) Progress towards PT goals: Progressing toward goals    Frequency       PT Plan Current plan remains appropriate    Co-evaluation             End of Session Equipment Utilized During Treatment: Gait belt Activity Tolerance: Patient tolerated treatment well       Time: 1340-1400 PT Time Calculation (min) (ACUTE ONLY): 20 min  Charges:  $Gait Training: 8-22 mins $Therapeutic Exercise: 8-22 mins                    G Codes:      [name] [name]  PTA [facility]  Acute  Rehab Pager      [phone]

## 2014-11-18 NOTE — Care Management Note (Signed)
Case Management Note  Patient Details  Name: [name] [name] MRN: [id number] Date of Birth: 11/12/1940  Subjective/Objective:                   COMPUTER ASSISTED TOTAL LEFT KNEE ARTHROPLASTY (Left) Action/Plan:  Discharge planning Expected Discharge Date:   11/19/14               Expected Discharge Plan:  Home w Home Health Services  In-House Referral:     Discharge planning Services  CM Consult  Post Acute Care Choice:  Home Health Choice offered to:  Patient  DME Arranged:  N/A DME Agency:     HH Arranged:  PT HH Agency:  [facility]  Status of Service:  Completed, signed off  Medicare Important Message Given:    Date Medicare IM Given:    Medicare IM give by:    Date Additional Medicare IM Given:    Additional Medicare Important Message give by:     If discussed at Long Length of Stay Meetings, dates discussed:    Additional Comments: CM met with pt in room to offer choice of home health agency.  Pt chooses [facility] to render HHPT.  Pt has rolling walker at home and denies need for 3n1.  Referral emailed to [facility] rep, Tim.  No other CM needs were communicated.   Jeffries, Sarah Christine, RN 11/18/2014, 12:29 PM  

## 2014-11-18 NOTE — Plan of Care (Signed)
Problem: Consults Goal: Diagnosis- Total Joint Replacement Outcome: Completed/Met Date Met:  11/18/14 Primary Total Knee LEFT  Problem: Phase III Progression Outcomes Goal: Anticoagulant follow-up in place Outcome: Completed/Met Date Met:  11/18/14 Primarily d/t previous PE.

## 2014-11-18 NOTE — Discharge Summary (Signed)
Physician Discharge Summary  Patient ID: [name] [name] MRN: [id number] DOB/AGE: 07-Dec-1940 78 y.o.  Admit date: 11/17/2014 Discharge date: 11/19/2014  Admission Diagnoses:  Primary osteoarthritis of left knee  Discharge Diagnoses:  Principal Problem:   Primary osteoarthritis of left knee   Past Medical History  Diagnosis Date  . BPH (benign prostatic hypertrophy)   . Osteoarthritis   . Lumbar disc disease   . Nocturia   . Frequency of urination   . Difficulty sleeping   . History of DVT (deep vein thrombosis) JAN 2016  . PE (pulmonary embolism) JAN 2016    Surgeries: Procedure(s): COMPUTER ASSISTED TOTAL LEFT KNEE ARTHROPLASTY on 11/17/2014   Consultants (if any):    Discharged Condition: Improved  Hospital Course: [name] [name] is an 74 y.o. male who was admitted 11/17/2014 with a diagnosis of Primary osteoarthritis of left knee and went to the operating room on 11/17/2014 and underwent the above named procedures.    He was given perioperative antibiotics:      Anti-infectives    Start     Dose/Rate Route Frequency Ordered Stop   11/17/14 1800  vancomycin (VANCOCIN) IVPB 1000 mg/200 mL premix     1,000 mg 200 mL/hr over 60 Minutes Intravenous Every 12 hours 11/17/14 1310 11/17/14 1914   11/17/14 0700  vancomycin (VANCOCIN) 1,000 mg in sodium chloride 0.9 % 250 mL IVPB  Status:  Discontinued     1,000 mg 250 mL/hr over 60 Minutes Intravenous  Once 11/17/14 0654 11/17/14 0656   11/17/14 0700  vancomycin (VANCOCIN) IVPB 1000 mg/200 mL premix     1,000 mg 200 mL/hr over 60 Minutes Intravenous  Once 11/17/14 0656 11/17/14 0806   11/17/14 0544  ceFAZolin (ANCEF) IVPB 2 g/50 mL premix     2 g 100 mL/hr over 30 Minutes Intravenous On call to O.R. 11/17/14 6712 11/17/14 0740    .  He was given sequential compression devices, early ambulation, and apixaban for DVT prophylaxis.  He benefited maximally from the hospital stay and there were no complications.    Recent  vital signs:  Filed Vitals:   11/19/14 0534  BP: 122/65  Pulse: 77  Temp: 98.2 F (36.8 C)  Resp: 18    Recent laboratory studies:  Lab Results  Component Value Date   HGB 11.2* 11/19/2014   HGB 11.8* 11/18/2014   HGB 14.6 11/16/2014   Lab Results  Component Value Date   WBC 5.8 11/19/2014   PLT 173 11/19/2014   Lab Results  Component Value Date   INR 1.02 11/16/2014   Lab Results  Component Value Date   NA 134* 11/18/2014   K 4.1 11/18/2014   CL 102 11/18/2014   CO2 25 11/18/2014   BUN 17 11/18/2014   CREATININE 0.98 11/18/2014   GLUCOSE 121* 11/18/2014    Discharge Medications:     Medication List    STOP taking these medications        acetaminophen 325 MG tablet  Commonly known as:  TYLENOL     mupirocin nasal ointment 2 %  Commonly known as:  BACTROBAN     oxyCODONE 5 MG immediate release tablet  Commonly known as:  Oxy IR/ROXICODONE     Rivaroxaban 15 MG Tabs tablet  Commonly known as:  XARELTO     rivaroxaban 20 MG Tabs tablet  Commonly known as:  XARELTO      TAKE these medications        apixaban 2.5 MG Tabs  tablet  Commonly known as:  ELIQUIS  Take 1 tablet (2.5 mg total) by mouth every 12 (twelve) hours.     docusate sodium 100 MG capsule  Commonly known as:  COLACE  Take 1 capsule (100 mg total) by mouth 2 (two) times daily.     HYDROcodone-acetaminophen 5-325 MG tablet  Commonly known as:  NORCO  Take 1-2 tablets by mouth every 4 (four) hours as needed for moderate pain.     methocarbamol 500 MG tablet  Commonly known as:  ROBAXIN  Take 1 tablet (500 mg total) by mouth every 6 (six) hours as needed for muscle spasms.     ondansetron 4 MG tablet  Commonly known as:  ZOFRAN  Take 1 tablet (4 mg total) by mouth every 6 (six) hours as needed for nausea.     senna 8.6 MG Tabs tablet  Commonly known as:  SENOKOT  Take 2 tablets (17.2 mg total) by mouth at bedtime.        Diagnostic Studies: Ir Ivc Filter Plmt / S&i /img  Guid/mod Sed  11/16/2014  CLINICAL DATA:  History of prior postoperative DVT and pulmonary embolus after hip replacement. Preoperative temporary filter insertion prior to bilateral knee replacement EXAM: ULTRASOUND GUIDANCE FOR VASCULAR ACCESS IVC CATHETERIZATION AND VENOGRAM IVC FILTER INSERTION Date:  10/17/201610/17/2016 12:10 pm Radiologist:  M. Daryll Brod, MD Guidance:  Fluoroscopic and ultrasound FLUOROSCOPY TIME:  1 minutes 12 seconds, 82 mGy MEDICATIONS AND MEDICAL HISTORY: 2 mg Versed, 50 mcg fentanyl ANESTHESIA/SEDATION: 10 minutes CONTRAST:  55 cc Omnipaque 010 COMPLICATIONS: None immediate PROCEDURE: Informed consent was obtained from the patient following explanation of the procedure, risks, benefits and alternatives. The patient understands, agrees and consents for the procedure. All questions were addressed. A time out was performed. Maximal barrier sterile technique utilized including caps, mask, sterile gowns, sterile gloves, large sterile drape, hand hygiene, and betadine prep. Under sterile condition and local anesthesia, right internal jugular venous access was performed with ultrasound. Over a guide wire, the IVC filter delivery sheath and inner dilator were advanced into the IVC just above the IVC bifurcation. Contrast injection was performed for an IVC venogram. IVC VENOGRAM: The IVC is patent. No evidence of thrombus, stenosis, or occlusion. No variant venous anatomy. The renal veins are identified at L1-2. IVC FILTER INSERTION: Through the delivery sheath, the Bard Denali IVC filter was deployed in the infrarenal IVC at the L3 level just below the renal veins and above the IVC bifurcation. Contrast injection confirmed position. There is good apposition of the filter against the IVC. The delivery sheath was removed and hemostasis was obtained with compression for 5 minutes. The patient tolerated the procedure well. No immediate complications. IMPRESSION: Ultrasound and fluoroscopically  guided infrarenal IVC filter insertion. Electronically Signed   By: Jerilynn Mages.  Shick M.D.   On: 11/16/2014 13:04   Dg Knee Left Port  11/17/2014  CLINICAL DATA:  Postop knee replacement EXAM: PORTABLE LEFT KNEE - 1-2 VIEW COMPARISON:  None. FINDINGS: Total knee replacement in satisfactory position and alignment. Joint effusion with soft tissue drain. No fracture or displacement of the prosthesis. Arterial calcification IMPRESSION: Satisfactory left knee replacement without acute complication. Electronically Signed   By: Franchot Gallo M.D.   On: 11/17/2014 12:37    Disposition: 01-Home or Self Care  Discharge Instructions    Call MD / Call 911    Complete by:  As directed   If you experience chest pain or shortness of breath, CALL 911 and  be transported to the hospital emergency room.  If you develope a fever above 101 F, pus (white drainage) or increased drainage or redness at the wound, or calf pain, call your surgeon's office.     Constipation Prevention    Complete by:  As directed   Drink plenty of fluids.  Prune juice may be helpful.  You may use a stool softener, such as Colace (over the counter) 100 mg twice a day.  Use MiraLax (over the counter) for constipation as needed.     Diet - low sodium heart healthy    Complete by:  As directed      Do not put a pillow under the knee. Place it under the heel.    Complete by:  As directed      Driving restrictions    Complete by:  As directed   No driving for 6 weeks     Increase activity slowly as tolerated    Complete by:  As directed      Lifting restrictions    Complete by:  As directed   No lifting for 6 weeks     TED hose    Complete by:  As directed   Use stockings (TED hose) for 2 weeks on both leg(s).  You may remove them at night for sleeping.           Follow-up Information    Follow up with Baptiste Littler, Horald Pollen, MD. Schedule an appointment as soon as possible for a visit in 2 weeks.   Specialty:  Orthopedic Surgery   Why:   For wound re-check   Contact information:   [address] [place] [address] [phone]       Follow up with [facility].   Why:  home health agency   Contact information:   [address] [place] [address] [phone]        Signed: Elie Goody 11/19/2014, 1:51 PM

## 2014-11-19 LAB — CBC
HEMATOCRIT: 33.3 % — AB (ref 39.0–52.0)
HEMOGLOBIN: 11.2 g/dL — AB (ref 13.0–17.0)
MCH: 31.4 pg (ref 26.0–34.0)
MCHC: 33.6 g/dL (ref 30.0–36.0)
MCV: 93.3 fL (ref 78.0–100.0)
Platelets: 173 10*3/uL (ref 150–400)
RBC: 3.57 MIL/uL — AB (ref 4.22–5.81)
RDW: 13.8 % (ref 11.5–15.5)
WBC: 5.8 10*3/uL (ref 4.0–10.5)

## 2014-11-19 NOTE — Progress Notes (Signed)
OT Cancellation Note  Patient Details Name: JONTAVIOUS COMMONS MRN: [id number] DOB: 02-14-1940   Cancelled Treatment:    Reason Eval/Treat Not Completed: Other (comment) Pt declines need to practice shower transfer or commode transfer for d/c today. He states, "this isnt the first time" I have done this. He states his pain is more today--notified nursing that pt requesting pain meds. Cautioned pt to not shower if hurting a lot at home--he states he will likely sponge bathe initially.  Jules Schick  [phone] 11/19/2014, 12:34 PM

## 2014-11-19 NOTE — Progress Notes (Signed)
   Subjective:  Patient reports pain as mild to moderate.  No c/o. Denies N/V/CP/SOB.  Objective:   VITALS:   Filed Vitals:   11/18/14 1130 11/18/14 1530 11/18/14 2248 11/19/14 0534  BP: 128/62 111/60 129/60 122/65  Pulse:  75 76 77  Temp:  98.3 F (36.8 C) 98 F (36.7 C) 98.2 F (36.8 C)  TempSrc:  Oral Oral Oral  Resp:  16 16 18   Height:      Weight:      SpO2:  96% 97% 98%    ABD soft Sensation intact distally Intact pulses distally Dorsiflexion/Plantar flexion intact Incision: dressing C/D/I Compartment soft   Lab Results  Component Value Date   WBC 5.8 11/19/2014   HGB 11.2* 11/19/2014   HCT 33.3* 11/19/2014   MCV 93.3 11/19/2014   PLT 173 11/19/2014   BMET    Component Value Date/Time   NA 134* 11/18/2014 0538   K 4.1 11/18/2014 0538   CL 102 11/18/2014 0538   CO2 25 11/18/2014 0538   GLUCOSE 121* 11/18/2014 0538   BUN 17 11/18/2014 0538   CREATININE 0.98 11/18/2014 0538   CALCIUM 8.4* 11/18/2014 0538   GFRNONAA >60 11/18/2014 0538   GFRAA >60 11/18/2014 0538     Assessment/Plan: 2 Days Post-Op   Principal Problem:   Primary osteoarthritis of left knee   WBAT with walker PO pain control DVT ppx: apixaban, foot pumps, TEDs Advance diet Up with therapy D/C IV fluids Discharge home with home health    Blakleigh Straw, Horald Pollen 11/19/2014, 7:33 AM   Rod Can, MD Cell [phone]

## 2014-11-19 NOTE — Progress Notes (Signed)
Physical Therapy Treatment Patient Details Name: [name] [name] MRN: [id number] DOB: 1940-03-03 Today's Date: 11/19/2014    History of Present Illness s/p L TKA    PT Comments    POD # 2 pm session.  Spouse present.  Family education, had spouse assist pt OOB, amb in hallway then practice steps.  Pt ready for D/C to home.   Follow Up Recommendations  Home health PT     Equipment Recommendations  None recommended by PT    Recommendations for Other Services       Precautions / Restrictions Precautions Precautions: Knee;Fall Restrictions Weight Bearing Restrictions: No Other Position/Activity Restrictions: WBAT    Mobility  Bed Mobility Overal bed mobility: Needs Assistance Bed Mobility: Supine to Sit     Supine to sit: Supervision     General bed mobility comments: increased time  Transfers Overall transfer level: Needs assistance Equipment used: Rolling walker (2 wheeled) Transfers: Sit to/from Stand Sit to Stand: Supervision;Min guard         General transfer comment: 25% VC's on proper tech and hand placement as well as L LE advancement  Ambulation/Gait Ambulation/Gait assistance: Min guard;Supervision Ambulation Distance (Feet): 12 Feet Assistive device: Rolling walker (2 wheeled) Gait Pattern/deviations: Step-to pattern;Decreased stance time - left Gait velocity: WFL   General Gait Details: cues for RW safety and RW distance from self   Stairs Stairs: Yes Stairs assistance: Min guard Stair Management: Two rails;Forwards;Step to pattern Number of Stairs: 2 General stair comments: 25% Vc's on proper sequencing with spouse present for education  Wheelchair Mobility    Modified Rankin (Stroke Patients Only)       Balance                                    Cognition                            Exercises      General Comments        Pertinent Vitals/Pain      Home Living                       Prior Function            PT Goals (current goals can now be found in the care plan section) Progress towards PT goals: Progressing toward goals    Frequency  7X/week    PT Plan Current plan remains appropriate    Co-evaluation             End of Session Equipment Utilized During Treatment: Gait belt Activity Tolerance: Patient tolerated treatment well Patient left: in bed;with call bell/phone within reach     Time: 1205-1220 PT Time Calculation (min) (ACUTE ONLY): 15 min  Charges:  $Gait Training: 8-22 mins                     G Codes:      Rica Koyanagi  PTA [facility]  Acute  Rehab Pager      [phone]

## 2014-11-19 NOTE — Progress Notes (Signed)
Physical Therapy Treatment Patient Details Name: [name] [name] MRN: [id number] DOB: 17-Jul-1940 Today's Date: 11/19/2014    History of Present Illness s/p L TKA    PT Comments    POD # 2 am session.  Max encouragement needed to participate with increased c/o knee pain.  Assisted OOB to amb to bathroom then in hallway.  Pt instructed to increase WBing thru L LE during gait.  Assisted back to bed per pt request.  Pt plans to D/C to home later today.    Follow Up Recommendations  Home health PT     Equipment Recommendations  None recommended by PT    Recommendations for Other Services       Precautions / Restrictions Precautions Precautions: Knee;Fall Restrictions Weight Bearing Restrictions: No Other Position/Activity Restrictions: WBAT    Mobility  Bed Mobility Overal bed mobility: Needs Assistance Bed Mobility: Supine to Sit     Supine to sit: Supervision     General bed mobility comments: increased time  Transfers Overall transfer level: Needs assistance Equipment used: Rolling walker (2 wheeled) Transfers: Sit to/from Stand Sit to Stand: Supervision;Min guard         General transfer comment: 25% VC's on proper tech and hand placement as well as L LE advancement  Ambulation/Gait Ambulation/Gait assistance: Min guard;Supervision Ambulation Distance (Feet): 85 Feet Assistive device: Rolling walker (2 wheeled) Gait Pattern/deviations: Step-to pattern;Decreased stance time - left Gait velocity: WFL   General Gait Details: cues for RW safety and RW distance from self   Stairs            Wheelchair Mobility    Modified Rankin (Stroke Patients Only)       Balance                                    Cognition                            Exercises      General Comments        Pertinent Vitals/Pain      Home Living                      Prior Function            PT Goals (current goals can now  be found in the care plan section) Progress towards PT goals: Progressing toward goals    Frequency  7X/week    PT Plan Current plan remains appropriate    Co-evaluation             End of Session Equipment Utilized During Treatment: Gait belt Activity Tolerance: Patient tolerated treatment well Patient left: in bed;with call bell/phone within reach     Time: 1000-1025 PT Time Calculation (min) (ACUTE ONLY): 25 min  Charges:  $Gait Training: 8-22 mins $Therapeutic Activity: 8-22 mins                    G Codes:      Rica Koyanagi  PTA [facility]  Acute  Rehab Pager      [phone]

## 2015-01-11 ENCOUNTER — Other Ambulatory Visit: Payer: Self-pay | Admitting: Radiology

## 2015-01-12 ENCOUNTER — Ambulatory Visit (HOSPITAL_COMMUNITY)
Admission: RE | Admit: 2015-01-12 | Discharge: 2015-01-12 | Disposition: A | Discharge status: Home / Self Care | Payer: Medicare Other | Source: Ambulatory Visit | Attending: Orthopedic Surgery | Admitting: Orthopedic Surgery

## 2015-01-12 ENCOUNTER — Ambulatory Visit (HOSPITAL_BASED_OUTPATIENT_CLINIC_OR_DEPARTMENT_OTHER)
Admission: RE | Admit: 2015-01-12 | Discharge: 2015-01-12 | Disposition: A | Discharge status: Home / Self Care | Payer: Medicare Other | Source: Ambulatory Visit | Attending: Radiology | Admitting: Radiology

## 2015-01-12 DIAGNOSIS — N400 Enlarged prostate without lower urinary tract symptoms: Secondary | ICD-10-CM | POA: Insufficient documentation

## 2015-01-12 DIAGNOSIS — Z86718 Personal history of other venous thrombosis and embolism: Secondary | ICD-10-CM | POA: Insufficient documentation

## 2015-01-12 DIAGNOSIS — Z95828 Presence of other vascular implants and grafts: Secondary | ICD-10-CM | POA: Insufficient documentation

## 2015-01-12 DIAGNOSIS — M179 Osteoarthritis of knee, unspecified: Secondary | ICD-10-CM | POA: Insufficient documentation

## 2015-01-12 DIAGNOSIS — I82402 Acute embolism and thrombosis of unspecified deep veins of left lower extremity: Secondary | ICD-10-CM | POA: Diagnosis not present

## 2015-01-12 DIAGNOSIS — Z86711 Personal history of pulmonary embolism: Secondary | ICD-10-CM | POA: Insufficient documentation

## 2015-01-12 DIAGNOSIS — Z4689 Encounter for fitting and adjustment of other specified devices: Secondary | ICD-10-CM | POA: Diagnosis present

## 2015-01-12 DIAGNOSIS — Z538 Procedure and treatment not carried out for other reasons: Secondary | ICD-10-CM | POA: Diagnosis not present

## 2015-01-12 DIAGNOSIS — Z7982 Long term (current) use of aspirin: Secondary | ICD-10-CM | POA: Insufficient documentation

## 2015-01-12 DIAGNOSIS — Z96652 Presence of left artificial knee joint: Secondary | ICD-10-CM | POA: Insufficient documentation

## 2015-01-12 DIAGNOSIS — Z79899 Other long term (current) drug therapy: Secondary | ICD-10-CM | POA: Insufficient documentation

## 2015-01-12 DIAGNOSIS — Z09 Encounter for follow-up examination after completed treatment for conditions other than malignant neoplasm: Secondary | ICD-10-CM

## 2015-01-12 DIAGNOSIS — I82441 Acute embolism and thrombosis of right tibial vein: Secondary | ICD-10-CM | POA: Insufficient documentation

## 2015-01-12 LAB — CBC
HCT: 39.6 % (ref 39.0–52.0)
HEMOGLOBIN: 13.4 g/dL (ref 13.0–17.0)
MCH: 31 pg (ref 26.0–34.0)
MCHC: 33.8 g/dL (ref 30.0–36.0)
MCV: 91.7 fL (ref 78.0–100.0)
Platelets: UNDETERMINED 10*3/uL (ref 150–400)
RBC: 4.32 MIL/uL (ref 4.22–5.81)
RDW: 13.5 % (ref 11.5–15.5)
WBC: 4.8 10*3/uL (ref 4.0–10.5)

## 2015-01-12 LAB — BASIC METABOLIC PANEL
Anion gap: 8 (ref 5–15)
BUN: 11 mg/dL (ref 6–20)
CALCIUM: 8.8 mg/dL — AB (ref 8.9–10.3)
CO2: 25 mmol/L (ref 22–32)
Chloride: 106 mmol/L (ref 101–111)
Creatinine, Ser: 0.8 mg/dL (ref 0.61–1.24)
GFR calc Af Amer: >60 mL/min (ref >60)
GLUCOSE: 106 mg/dL — AB (ref 65–99)
Potassium: 3.9 mmol/L (ref 3.5–5.1)
SODIUM: 139 mmol/L (ref 135–145)

## 2015-01-12 LAB — APTT: APTT: 29 s (ref 24–37)

## 2015-01-12 LAB — PROTIME-INR
INR: 1.07 (ref 0.00–1.49)
PROTHROMBIN TIME: 14.1 s (ref 11.6–15.2)

## 2015-01-12 MED ORDER — SODIUM CHLORIDE 0.9 % IV SOLN
Freq: Once | INTRAVENOUS | Status: AC
  Administered 2015-01-12: 12:00:00 via INTRAVENOUS

## 2015-01-12 MED ORDER — MIDAZOLAM HCL 2 MG/2ML IJ SOLN
INTRAMUSCULAR | Status: AC
  Filled 2015-01-12: qty 4

## 2015-01-12 MED ORDER — FENTANYL CITRATE (PF) 100 MCG/2ML IJ SOLN
INTRAMUSCULAR | Status: AC
  Filled 2015-01-12: qty 2

## 2015-01-12 MED ORDER — LIDOCAINE HCL 1 % IJ SOLN
INTRAMUSCULAR | Status: AC
  Filled 2015-01-12: qty 20

## 2015-01-12 NOTE — Progress Notes (Signed)
VASCULAR LAB PRELIMINARY  PRELIMINARY  PRELIMINARY  PRELIMINARY  Bilateral lower extremity venous duplex completed.    Preliminary report:  Right:  DVT noted coursing from the mid to proximal posterior tibial vein and proximal peroneal vein .  No evidence of superficial thrombosis.  No Baker's cyst. Left:  No evidence of DVT, superficial thrombosis, or Baker's cyst.  Denaisha Swango, RVS 01/12/2015, 3:55 PM

## 2015-01-12 NOTE — Progress Notes (Signed)
No procedure done today, IV removed, pt. To follow-up with primary doctor, pt. Ambulated to car.

## 2015-01-12 NOTE — Progress Notes (Signed)
Patient ID: [name] [name], male   DOB: Sep 12, 1940, 74 y.o.   MRN: [id number]    Referring Physician(s): Swinteck,Brian  Chief Complaint:  "I'm here to get my filter out"  Subjective: Pt familiar to IR service from IVC filter placement on 11/16/14. He has prior hx of RLL PE/LLE DVT on 03/01/14 and had IVC filter placed 11/16/14 prior to left knee arthroplasty which was performed on 11/17/14. Last dose of eliquis was over 1 month ago per pt. He presents today for IVC filter removal. He denies fever, HA,CP,dyspnea, cough, abd pain,N/V or bleeding. He does have chronic back pain and some decreased ROM left leg.   Allergies: Review of patient's allergies indicates no known allergies.  Medications: Prior to Admission medications   Medication Sig Start Date End Date Taking? Authorizing Provider  aspirin 325 MG tablet Take 325 mg by mouth daily.   Yes Historical Provider, MD  apixaban (ELIQUIS) 2.5 MG TABS tablet Take 1 tablet (2.5 mg total) by mouth every 12 (twelve) hours. 11/18/14   Rod Can, MD  docusate sodium (COLACE) 100 MG capsule Take 1 capsule (100 mg total) by mouth 2 (two) times daily. 11/18/14   Rod Can, MD  HYDROcodone-acetaminophen (NORCO) 5-325 MG tablet Take 1-2 tablets by mouth every 4 (four) hours as needed for moderate pain. 11/18/14   Rod Can, MD  methocarbamol (ROBAXIN) 500 MG tablet Take 1 tablet (500 mg total) by mouth every 6 (six) hours as needed for muscle spasms. 11/18/14   Rod Can, MD  ondansetron (ZOFRAN) 4 MG tablet Take 1 tablet (4 mg total) by mouth every 6 (six) hours as needed for nausea. 11/18/14   Rod Can, MD  senna (SENOKOT) 8.6 MG TABS tablet Take 2 tablets (17.2 mg total) by mouth at bedtime. 11/18/14   Rod Can, MD     Vital Signs: BP 122/67 mmHg  Pulse 95  Temp(Src) 97.8 F (36.6 C) (Oral)  Resp 18  Ht 6' (1.829 m)  Wt 210 lb (95.255 kg)  BMI 28.47 kg/m2  SpO2 98%  Physical Exam awake/alert; chest-  distant BS bilat; heart- RRR; abd- soft,+BS,NT; ext- no sig LE edema  Imaging: No results found.  Labs:  CBC:  Recent Labs  11/16/14 0920 11/18/14 0538 11/19/14 0527 01/12/15 1155  WBC 5.1 8.2 5.8 4.8  HGB 14.6 11.8* 11.2* 13.4  HCT 41.9 34.6* 33.3* 39.6  PLT PLATELET CLUMPS NOTED ON SMEAR, UNABLE TO ESTIMATE 189 173 PLATELET CLUMPS NOTED ON SMEAR, UNABLE TO ESTIMATE    COAGS:  Recent Labs  03/02/14 1444 11/09/14 1120 11/16/14 0920 01/12/15 1155  INR 1.63* 1.08 1.02 1.07  APTT 49* 31 30 29     BMP:  Recent Labs  11/09/14 1120 11/16/14 0920 11/18/14 0538 01/12/15 1155  NA 137 140 134* 139  K 4.1 3.9 4.1 3.9  CL 102 107 102 106  CO2 27 25 25 25   GLUCOSE 104* 109* 121* 106*  BUN 12 15 17 11   CALCIUM 9.1 9.1 8.4* 8.8*  CREATININE 0.85 0.92 0.98 0.80  GFRNONAA >60 >60 >60 >60  GFRAA >60 >60 >60 >60    LIVER FUNCTION TESTS:  Recent Labs  01/22/14 1321 11/09/14 1120  BILITOT 0.4 0.8  AST 15 14*  ALT 13 11*  ALKPHOS 49 64  PROT 7.5 7.9  ALBUMIN 3.8 4.1    Assessment and Plan: Pt with prior hx of RLL PE/LLE DVT on 03/01/14 initially treated with xarelto . IVC filter placed on 11/16/14 prior to left  knee arthroplasty for end stage OA which was performed on 11/17/14. Pt subsequently placed on eliquis afterwards and last dose was 1.5 months ago per pt. Request now received from Dr. Lyla Glassing for IVC filter removal. Details/risks of procedure, including but not limited to, internal bleeding, infection, inability to remove filter discussed with patient with his understanding and consent. Prior to filter removal we will obtain follow-up lower extremity venous duplex study to rule out residual or new DVT.  Signed: D. Rowe Robert 01/12/2015, 1:12 PM   I spent a total of 15 minutes at the the patient's bedside AND on the patient's hospital floor or unit, greater than 50% of which was counseling/coordinating care for IVC filter removal

## 2015-01-12 NOTE — Progress Notes (Addendum)
Patient ID: [name] [name], male   DOB: January 27, 1941, 74 y.o.   MRN: [id number] Pt noted to have new RLE DVT on today's venous doppler study. IVC filter removal cancelled for today. Pt's primary care physician's (Dr. Lujean [phone]) office notified of results and will initiate resumption of anticoagulation. They will schedule pt for f/u visit.

## 2015-01-19 ENCOUNTER — Telehealth: Payer: Self-pay | Admitting: Hematology

## 2015-01-19 NOTE — Telephone Encounter (Signed)
S/W PT; IN RE TO NP APPT. ON 02/15/15@1 :30 REFERRING DR Dorthy Cooler DX-DVT

## 2015-01-28 ENCOUNTER — Other Ambulatory Visit (HOSPITAL_COMMUNITY): Payer: Self-pay | Admitting: Diagnostic Radiology

## 2015-02-15 ENCOUNTER — Encounter: Payer: Self-pay | Admitting: Hematology

## 2015-02-15 ENCOUNTER — Ambulatory Visit (HOSPITAL_BASED_OUTPATIENT_CLINIC_OR_DEPARTMENT_OTHER): Payer: Medicare Other | Admitting: Hematology

## 2015-02-15 ENCOUNTER — Telehealth: Payer: Self-pay | Admitting: Hematology

## 2015-02-15 ENCOUNTER — Ambulatory Visit (HOSPITAL_BASED_OUTPATIENT_CLINIC_OR_DEPARTMENT_OTHER): Payer: Medicare Other

## 2015-02-15 VITALS — BP 129/71 | HR 91 | Temp 97.5°F | Resp 16

## 2015-02-15 DIAGNOSIS — I824Z2 Acute embolism and thrombosis of unspecified deep veins of left distal lower extremity: Secondary | ICD-10-CM

## 2015-02-15 DIAGNOSIS — I82432 Acute embolism and thrombosis of left popliteal vein: Secondary | ICD-10-CM

## 2015-02-15 DIAGNOSIS — I2699 Other pulmonary embolism without acute cor pulmonale: Secondary | ICD-10-CM

## 2015-02-15 LAB — CBC & DIFF AND RETIC
BASO%: 0.4 % (ref 0.0–2.0)
BASOS ABS: 0 10*3/uL (ref 0.0–0.1)
EOS%: 1.5 % (ref 0.0–7.0)
Eosinophils Absolute: 0.1 10*3/uL (ref 0.0–0.5)
HEMATOCRIT: 43.6 % (ref 38.4–49.9)
HGB: 14.8 g/dL (ref 13.0–17.1)
IMMATURE RETIC FRACT: 2.9 % — AB (ref 3.00–10.60)
LYMPH#: 1.4 10*3/uL (ref 0.9–3.3)
LYMPH%: 25.3 % (ref 14.0–49.0)
MCH: 31.1 pg (ref 27.2–33.4)
MCHC: 33.9 g/dL (ref 32.0–36.0)
MCV: 91.6 fL (ref 79.3–98.0)
MONO#: 0.5 10*3/uL (ref 0.1–0.9)
MONO%: 8.8 % (ref 0.0–14.0)
NEUT#: 3.4 10*3/uL (ref 1.5–6.5)
NEUT%: 64 % (ref 39.0–75.0)
PLATELETS: 188 10*3/uL (ref 140–400)
RBC: 4.76 10*6/uL (ref 4.20–5.82)
RDW: 13.7 % (ref 11.0–14.6)
RETIC CT ABS: 31.42 10*3/uL — AB (ref 34.80–93.90)
Retic %: 0.66 % — ABNORMAL LOW (ref 0.80–1.80)
WBC: 5.4 10*3/uL (ref 4.0–10.3)

## 2015-02-15 LAB — COMPREHENSIVE METABOLIC PANEL
ALK PHOS: 72 U/L (ref 40–150)
ANION GAP: 10 meq/L (ref 3–11)
AST: 9 U/L (ref 5–34)
Albumin: 3.9 g/dL (ref 3.5–5.0)
BILIRUBIN TOTAL: 0.44 mg/dL (ref 0.20–1.20)
BUN: 12.6 mg/dL (ref 7.0–26.0)
CALCIUM: 9.3 mg/dL (ref 8.4–10.4)
CO2: 26 mEq/L (ref 22–29)
CREATININE: 1 mg/dL (ref 0.7–1.3)
Chloride: 105 mEq/L (ref 98–109)
EGFR: 84 mL/min/{1.73_m2} — AB (ref >90)
Glucose: 100 mg/dl (ref 70–140)
Potassium: 4.6 mEq/L (ref 3.5–5.1)
Sodium: 141 mEq/L (ref 136–145)
TOTAL PROTEIN: 8.1 g/dL (ref 6.4–8.3)

## 2015-02-15 NOTE — Progress Notes (Signed)
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  HISTORY OF PRESENTING ILLNESS:   [name] [name] is a wonderful 75 y.o. 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  Patient has a history of BPH, tobacco smoking, osteoarthritis, lumbar disc disease who apparently had a fall on 01/22/2014 and fractured his left hip. He had IM nailing for intertrochanteric fracture of his left hip on 01/22/2014. Patient notes that he was then discharged to rehabilitation on prophylactic anticoagulation but was noted to develop a left lower extremity (branch of the left popliteal vein) DVT and right lower lobe segmental pulmonary embolism on 02/18/2014. He was told that he was in the suboptimal dose of blood thinners for DVT prophylaxis postoperatively. Patient notes that he was treated with Rivaroxaban for 6 months. He had no issues with bleeding. His pulmonary embolism was noted incidentally on CTA of the chest and he did not have any chest pain or shortness of breath related to that. Patient got of his blood thinners after 6 months of treatment.  Patient notes that in October 2016 he had a fall with buckling of his left knee resulting in left knee injury which accentuated his discomfort from his osteoarthritis. He subsequently had a total knee arthroplasty on the left side on 11/17/2014. Patient reports that prior to his surgery he electively had an IVC filter placed. Patient notes that he was not on any prophylactic anticoagulation after his knee surgery and was in rehabilitation a few weeks. He returned to have his IVC filter removed in December and had an ultrasound of bilateral lower extremities prior to filter removal and was noted to have an  age indeterminate right lower extremity posterior tibial and proximal peroneal vein DVT. His IVC filter removal was apparently delayed a result. Patient has been on anticoagulation since mid December 2016 without any concerns of bleeding. Patient notes that he did not have any symptoms related to his right lower extremity DVT. Currently he has no issues with walking with no symptoms suggestive of post thrombotic syndrome. No chest pain. No shortness of breath. No abdominal pain.  He is keen to have his IVC filter removed as soon as possible.  He is still smoking half to 1 pack of cigarettes daily and understands that this is an ongoing risk factor for venous thromboembolism in addition to other arterial atherosclerotic disease.  No other acute symptoms or concerns at this time.  MEDICAL HISTORY:  Past Medical History  Diagnosis Date  . BPH (benign prostatic hypertrophy)   . Osteoarthritis   . Lumbar disc disease   . Nocturia   . Frequency of urination   . Difficulty sleeping   . History of DVT (deep vein thrombosis) JAN 2016  . PE (pulmonary embolism) JAN 2016    SURGICAL HISTORY: Past Surgical History  Procedure Laterality Date  . Lumbar laminectomy    . Intramedullary (im) nail intertrochanteric Left 01/22/2014    Procedure: INTRAMEDULLARY (IM) NAIL INTERTROCHANTRIC;  Surgeon: Melina Schools, MD;  Location: [place];  Service: Orthopedics;  Laterality: Left;  . Ivc filter placement ([facility] hx)  11/16/14  . Knee arthroplasty Left 11/17/2014    Procedure: Total knee Arthroplasty;  Surgeon: Rod Can, MD;  Location: [facility];  Service: Orthopedics;  Laterality:  Left;    SOCIAL HISTORY: Social History   Social History  . Marital Status: Married    Spouse Name: N/A  . Number of Children: N/A  . Years of Education: N/A   Occupational History  . Not on file.   Social History Main Topics  . Smoking status: Current Every Day Smoker -- 0.25 packs/day    Types: Cigarettes  .  Smokeless tobacco: Never Used  . Alcohol Use: No  . Drug Use: No  . Sexual Activity: No   Other Topics Concern  . Not on file   Social History Narrative   Patient reports to me that he has been smoking half to 1 pack of cigarettes daily. Denies significant alcohol use. FAMILY HISTORY: History reviewed. No pertinent family history.  ALLERGIES:  has No Known Allergies.  MEDICATIONS:  Current Outpatient Prescriptions  Medication Sig Dispense Refill  . rivaroxaban (XARELTO) 20 MG TABS tablet Take 20 mg by mouth daily with supper.     No current facility-administered medications for this visit.    REVIEW OF SYSTEMS:    10 Point review of Systems was done is negative except as noted above.  PHYSICAL EXAMINATION: ECOG PERFORMANCE STATUS: 1 - Symptomatic but completely ambulatory  . Filed Vitals:   02/15/15 1358  BP: 129/71  Pulse: 91  Temp: 97.5 F (36.4 C)  Resp: 16   There were no vitals filed for this visit. .There is no weight on file to calculate BMI.  GENERAL:alert, in no acute distress and comfortable SKIN: skin color, texture, turgor are normal, no rashes or significant lesions EYES: normal, conjunctiva are pink and non-injected, sclera clear OROPHARYNX:no exudate, no erythema and lips, buccal mucosa, and tongue normal  NECK: supple, no JVD, thyroid normal size, non-tender, without nodularity LYMPH:  no palpable lymphadenopathy in the cervical, axillary or inguinal LUNGS: clear to auscultation with normal respiratory effort HEART: regular rate & rhythm,  no murmurs and no lower extremity edema ABDOMEN: abdomen soft, non-tender, normoactive bowel sounds  Musculoskeletal: no cyanosis of digits and no clubbing  PSYCH: alert & oriented x 3 with fluent speech NEURO: no focal motor/sensory deficits  LABORATORY DATA:  I have reviewed the data as listed  . CBC Latest Ref Rng 01/12/2015 11/19/2014 11/18/2014  WBC 4.0 - 10.5 K/uL 4.8 5.8 8.2  Hemoglobin 13.0 -  17.0 g/dL 13.4 11.2(L) 11.8(L)  Hematocrit 39.0 - 52.0 % 39.6 33.3(L) 34.6(L)  Platelets 150 - 400 K/uL PLATELET CLUMPS NOTED ON SMEAR, UNABLE TO ESTIMATE 173 189    . CMP Latest Ref Rng 01/12/2015 11/18/2014 11/16/2014  Glucose 65 - 99 mg/dL 106(H) 121(H) 109(H)  BUN 6 - 20 mg/dL 11 17 15   Creatinine 0.61 - 1.24 mg/dL 0.80 0.98 0.92  Sodium 135 - 145 mmol/L 139 134(L) 140  Potassium 3.5 - 5.1 mmol/L 3.9 4.1 3.9  Chloride 101 - 111 mmol/L 106 102 107  CO2 22 - 32 mmol/L 25 25 25   Calcium 8.9 - 10.3 mg/dL 8.8(L) 8.4(L) 9.1  Total Protein 6.5 - 8.1 g/dL - - -  Total Bilirubin 0.3 - 1.2 mg/dL - - -  Alkaline Phos 38 - 126 U/L - - -  AST 15 - 41 U/L - - -  ALT 17 - 63 U/L - - -     RADIOGRAPHIC STUDIES: I have personally reviewed the radiological images as listed and agreed with the findings in the report. No results found.   [place] lower extremity veins 03/02/2014: Summary: Findings of DVT  in a branch of the left popliteal vein. No DVT in the right lower extremity.  Baker&'s cyst noted on the left. Other specific details can be found in the table(s) above. Prepared and Electronically Authenticated by  Gae Gallop MD 2016-02-02T10:21:48  Ultrasound lower extremity veins 01/12/2015 Summary: - Positive for an age indeterminate DVT of the right posterior  tibial vein coursing from mid calf to proximal calf. Also noted  is an age indeterminate DVT of the proximal peroneal on the  right. - No evidence of deep vein thrombosis involving the left lower  extremity. - No evidence of superficial thrombosis of the right or left lower  extremity.  Other specific details can be found in the table(s) above. Prepared and Electronically Authenticated by  Tinnie Gens MD 2016-12-14T12:11:34  ASSESSMENT & PLAN:   75 year old male with  #1 left lower extremity popliteal DVT in January 2016 within a month of having had a left hip surgery for a traumatic fracture from a  fall. Unclear as to the extent of postoperative DVT prophylaxis with the patient notes that he was told by his physicians that it was suboptimal. Patient completed 6 months of Rivaroxaban for this surgically provoked DVT. He was also noted to have an incidental asymptomatic right lower lobe segmental pulmonary embolism.  #2 right lower extremity posterior tibial and proximal peroneal DVT incidentally noted on ultrasound done prior to IVC filter removal. Patient was asymptomatic from these clots. This was noted in December 2016 after having had a left total knee arthroplasty in October 2016. Patient has been on Rivaroxaban for about 1 months without any bleeding issues.  No focal symptoms suggestive of malignancy. No family history or previous personal history suggestive of an inherited thrombophilia. Both of these events sound clearly provoked due to surgical intervention and relative immobility. Patient's significant ongoing smoking half to 1 pack per day of cigarettes is certainly an ongoing acquired risk factor.  Plan -The patient's venous thrombolic events both have obvious provoking events including surgery, his age and ongoing smoking. -Discussed the rationale for against some hypercoagulable workup. We decided to look for only the common low-dose thrombophilia is including factor V Leiden mutation and prothrombin gene mutation and to rule out APLA syndrome, though this workup would be likely low yield. -in the absence of any additional findings on his thrombophilia workup would complete the patient's current Rivaroxaban for a total of 6 months for the second provoked event causing low-risk DVT below the knee. After this would switch the patient to aspirin 325 mg by mouth daily for risk reduction for venous thromboembolism.  -Based on current understanding would not recommend lifelong anticoagulation. Patient does understand that his risk would certainly be higher than the general population for  recurrent venous thromboembolism given his previous events although likely triggered. -he will need aggressive peri-procedure DVT prophylaxis in the future, and DVT prophylaxis with other acquired risk factors such as hospitalization and significant infections. -he was encouraged to maintain an active lifestyle and ambulate around every 45 minutes to an hour with long-distance travel. -He was strongly recommended to pursue complete smoking cessation since this would be a strong risk factor for recurrent venous thrombo-embolism. -No clear role for maintaining his IVC filter in the absence of contraindications to anticoagulation. Would recommend removal of IVC filter as soon as possible by interventional radiology since this itself could potentially have thrombogenic influence over time.  Return to care with primary care physician. We'll call patient with results once available. Return to  care with Dr. Irene Limbo on an as-needed basis if any other questions or concerns arise    All of the patients questions were answered with apparent satisfaction. The patient knows to call the clinic with any problems, questions or concerns.  I spent 50 minutes counseling the patient face to face. The total time spent in the appointment was 60 minutes and more than 50% was on counseling and direct patient cares.    Sullivan Lone MD [place] AAHIVMS [facility] Hematology/Oncology Physician [facility]  (Office):       [phone] (Work cell):  [phone] (Fax):           [phone]  02/15/2015 2:56 PM

## 2015-02-15 NOTE — Telephone Encounter (Signed)
Patient sent back to lab - no f/u at this time.

## 2015-02-16 ENCOUNTER — Other Ambulatory Visit (HOSPITAL_COMMUNITY): Payer: Self-pay | Admitting: Interventional Radiology

## 2015-02-16 DIAGNOSIS — Z95828 Presence of other vascular implants and grafts: Secondary | ICD-10-CM

## 2015-02-17 LAB — BETA-2-GLYCOPROTEIN I ABS, IGG/M/A
BETA 2 GLYCO 1 IGM: 60 GPI IgM units — AB (ref 0–32)
Beta-2 Glyco 1 IgA: <9 GPI IgA units (ref 0–25)
Beta-2 Glycoprotein I Ab, IgG: <9 GPI IgG units (ref 0–20)

## 2015-02-17 LAB — LUPUS ANTICOAGULANT PANEL
DRVVT MIX: 74.7 s — AB (ref 0.0–44.0)
HEXAGONAL PHASE PHOSPHOLIPID: 10 s (ref 0–11)
PTT-LA Mix: 44 s — ABNORMAL HIGH (ref 0.0–40.6)
PTT-LA: 47.7 s — ABNORMAL HIGH (ref 0.0–40.6)
dRVVT Confirm: 1.6 ratio — ABNORMAL HIGH (ref 0.8–1.2)
dRVVT: 110.9 s — ABNORMAL HIGH (ref 0.0–44.0)

## 2015-02-18 LAB — FACTOR 5 LEIDEN

## 2015-02-19 LAB — PROTHROMBIN GENE MUTATION

## 2015-02-24 ENCOUNTER — Ambulatory Visit
Admission: RE | Admit: 2015-02-24 | Discharge: 2015-02-24 | Disposition: A | Discharge status: Home / Self Care | Payer: 59 | Source: Ambulatory Visit | Attending: Interventional Radiology | Admitting: Interventional Radiology

## 2015-02-24 ENCOUNTER — Other Ambulatory Visit (HOSPITAL_COMMUNITY): Payer: Self-pay | Admitting: Interventional Radiology

## 2015-02-24 ENCOUNTER — Ambulatory Visit
Admission: RE | Admit: 2015-02-24 | Discharge: 2015-02-24 | Disposition: A | Discharge status: Home / Self Care | Payer: Medicare Other | Source: Ambulatory Visit | Attending: Interventional Radiology | Admitting: Interventional Radiology

## 2015-02-24 DIAGNOSIS — Z95828 Presence of other vascular implants and grafts: Secondary | ICD-10-CM

## 2015-02-24 NOTE — Progress Notes (Signed)
Patient ID: [name] [name], male   DOB: 1941/01/31, 75 y.o.   MRN: [id number]    Chief Complaint: IVC filter retrieval evaluation. History of prior DVT. Filter placed preoperatively prior to left knee replacement. Subsequent encounter.  Referring Physician(s): Swinteck, kale  History of Present Illness: [name] [name] is a 75 y.o. male who had a prior history of DVT and small pulmonary embolus postoperatively. Prior to his recent left knee arthroplasty, he had a preoperative IVC filter placed 11/16/2014. Left knee replacement performed the following day. He returned on 01/12/2015 for filter removal. Prior to removing the filter, repeat bilateral lower extremity duplex ultrasound was performed. This demonstrated a new right calf tibial and peroneal DVT. Left lower extremity was negative for DVT. Because of this, he was placed on Xarelto. Filter was not removed on 01/12/2015. He returns for outpatient evaluation.  Repeat ultrasound today demonstrates no evidence of significant residual DVT. Patient is ambulating very well. He continues to recover from the left knee replacement. At this point, he is ready for filter removal. The procedure, risks, benefits and alternatives were reviewed. He understands the procedure and does wish to proceed with scheduling this electively as an outpatient in the next few weeks. Past Medical History  Diagnosis Date  . BPH (benign prostatic hypertrophy)   . Osteoarthritis   . Lumbar disc disease   . Nocturia   . Frequency of urination   . Difficulty sleeping   . History of DVT (deep vein thrombosis) JAN 2016  . PE (pulmonary embolism) JAN 2016    Past Surgical History  Procedure Laterality Date  . Lumbar laminectomy    . Intramedullary (im) nail intertrochanteric Left 01/22/2014    Procedure: INTRAMEDULLARY (IM) NAIL INTERTROCHANTRIC;  Surgeon: Melina Schools, MD;  Location: [place];  Service: Orthopedics;  Laterality: Left;  . Ivc filter placement ([facility] hx)   11/16/14  . Knee arthroplasty Left 11/17/2014    Procedure: Total knee Arthroplasty;  Surgeon: Rod Can, MD;  Location: [facility];  Service: Orthopedics;  Laterality: Left;    Allergies: Review of patient's allergies indicates no known allergies.  Medications: Prior to Admission medications   Medication Sig Start Date End Date Taking? Authorizing Provider  rivaroxaban (XARELTO) 20 MG TABS tablet Take 20 mg by mouth daily with supper.    Historical Provider, MD     No family history on file.  Social History   Social History  . Marital Status: Married    Spouse Name: N/A  . Number of Children: N/A  . Years of Education: N/A   Social History Main Topics  . Smoking status: Current Every Day Smoker -- 0.25 packs/day    Types: Cigarettes  . Smokeless tobacco: Never Used  . Alcohol Use: No  . Drug Use: No  . Sexual Activity: No   Other Topics Concern  . Not on file   Social History Narrative    Review of Systems: A 12 point ROS discussed and pertinent positives are indicated in the HPI above.  All other systems are negative.  Review of Systems  Vital Signs: BP 135/78 mmHg  Pulse 77  Temp(Src) 97.7 F (36.5 C) (Oral)  Resp 14  Ht 6' (1.829 m)  Wt 210 lb (95.255 kg)  BMI 28.47 kg/m2  SpO2 98%  Physical Exam  Constitutional: He appears well-developed and well-nourished. No distress.  Cardiovascular: Normal rate and regular rhythm.   No murmur heard. Pulmonary/Chest: Effort normal and breath sounds normal. No respiratory distress. He has  no wheezes.  Abdominal: Soft. Bowel sounds are normal. He exhibits no distension.  Musculoskeletal: He exhibits no edema or tenderness.  Skin: Skin is warm and dry. No rash noted. He is not diaphoretic. No erythema.    Mallampati Score:    2 Imaging: US Venous Img Lower Bilateral  02/24/2015  CLINICAL DATA:  History of right calf tibial peroneal DVT postop. Recent left knee replacement 11/17/2014. Patient has a retrievable  filter. Assess for residual DVT prior to removal. He is now on Xarelto. EXAM: BILATERAL LOWER EXTREMITY VENOUS DOPPLER ULTRASOUND TECHNIQUE: Gray-scale sonography with graded compression, as well as color Doppler and duplex ultrasound were performed to evaluate the lower extremity deep venous systems from the level of the common femoral vein and including the common femoral, femoral, profunda femoral, popliteal and calf veins including the posterior tibial, peroneal and gastrocnemius veins when visible. The superficial great saphenous vein was also interrogated. Spectral Doppler was utilized to evaluate flow at rest and with distal augmentation maneuvers in the common femoral, femoral and popliteal veins. COMPARISON:  None. FINDINGS: RIGHT LOWER EXTREMITY Common Femoral Vein: No evidence of thrombus. Normal compressibility, respiratory phasicity and response to augmentation. Saphenofemoral Junction: No evidence of thrombus. Normal compressibility and flow on color Doppler imaging. Profunda Femoral Vein: No evidence of thrombus. Normal compressibility and flow on color Doppler imaging. Femoral Vein: No evidence of thrombus. Normal compressibility, respiratory phasicity and response to augmentation. Popliteal Vein: No evidence of thrombus. Normal compressibility, respiratory phasicity and response to augmentation. Calf Veins: No evidence of thrombus. Normal compressibility and flow on color Doppler imaging. Superficial Great Saphenous Vein: No evidence of thrombus. Normal compressibility and flow on color Doppler imaging. Venous Reflux:  None. Other Findings:  None. LEFT LOWER EXTREMITY Common Femoral Vein: No evidence of thrombus. Normal compressibility, respiratory phasicity and response to augmentation. Saphenofemoral Junction: No evidence of thrombus. Normal compressibility and flow on color Doppler imaging. Profunda Femoral Vein: No evidence of thrombus. Normal compressibility and flow on color Doppler imaging.  Femoral Vein: No evidence of thrombus. Normal compressibility, respiratory phasicity and response to augmentation. Popliteal Vein: No evidence of thrombus. Normal compressibility, respiratory phasicity and response to augmentation. Calf Veins: No evidence of thrombus. Normal compressibility and flow on color Doppler imaging. Superficial Great Saphenous Vein: No evidence of thrombus. Normal compressibility and flow on color Doppler imaging. Venous Reflux:  None. Other Findings:  None. IMPRESSION: No significant DVT demonstrated in either extremity. Electronically Signed   By: Jerilynn Mages.  Darionna Banke M.D.   On: 02/24/2015 16:09    Labs:  CBC:  Recent Labs  11/18/14 0538 11/19/14 0527 01/12/15 1155 02/15/15 1515  WBC 8.2 5.8 4.8 5.4  HGB 11.8* 11.2* 13.4 14.8  HCT 34.6* 33.3* 39.6 43.6  PLT 189 173 PLATELET CLUMPS NOTED ON SMEAR, UNABLE TO ESTIMATE 188    COAGS:  Recent Labs  03/02/14 1444 11/09/14 1120 11/16/14 0920 01/12/15 1155  INR 1.63* 1.08 1.02 1.07  APTT 49* 31 30 29     BMP:  Recent Labs  11/09/14 1120 11/16/14 0920 11/18/14 0538 01/12/15 1155 02/15/15 1515  NA 137 140 134* 139 141  K 4.1 3.9 4.1 3.9 4.6  CL 102 107 102 106  --   CO2 27 25 25 25 26   GLUCOSE 104* 109* 121* 106* 100  BUN 12 15 17 11  12.6  CALCIUM 9.1 9.1 8.4* 8.8* 9.3  CREATININE 0.85 0.92 0.98 0.80 1.0  GFRNONAA >60 >60 >60 >60  --   GFRAA >60 >60 >60 >  60  --     LIVER FUNCTION TESTS:  Recent Labs  11/09/14 1120 02/15/15 1515  BILITOT 0.8 0.44  AST 14* 9  ALT 11* <9  ALKPHOS 64 72  PROT 7.9 8.1  ALBUMIN 4.1 3.9    TUMOR MARKERS: No results for input(s): AFPTM, CEA, CA199, CHROMGRNA in the last 8760 hours.  Assessment and Plan:  Removable IVC filter placed 11/16/2014 prior to left knee arthroplasty. Postop recovery complicated by a right lower extremity calf DVT (01/12/2015). Patient is now on Xarelto. Repeat lower extremity ultrasound today demonstrates no significant residual DVT in  either extremity. Patient is ambulating very well. He continues to recover from left knee replacement surgery. Agree with filter removal at this point.  Plan: Schedule outpatient IVC venogram and attempted filter removal the next few weeks.  Thank you for this interesting consult.  I greatly enjoyed meeting [name] [name] and look forward to participating in their care.  A copy of this report was sent to the requesting provider on this date.  Electronically Signed: Greggory Keen 02/24/2015, 4:31 PM   I spent a total of    25 Minutes in face to face in clinical consultation, greater than 50% of which was counseling/coordinating care for with a removable IVC filter.

## 2015-02-25 ENCOUNTER — Other Ambulatory Visit: Payer: Self-pay | Admitting: Interventional Radiology

## 2015-02-25 DIAGNOSIS — I82409 Acute embolism and thrombosis of unspecified deep veins of unspecified lower extremity: Secondary | ICD-10-CM

## 2015-02-26 ENCOUNTER — Other Ambulatory Visit: Payer: Self-pay | Admitting: General Surgery

## 2015-03-01 ENCOUNTER — Ambulatory Visit (HOSPITAL_COMMUNITY)
Admission: RE | Admit: 2015-03-01 | Discharge: 2015-03-01 | Disposition: A | Discharge status: Home / Self Care | Payer: Medicare Other | Source: Ambulatory Visit | Attending: Interventional Radiology | Admitting: Interventional Radiology

## 2015-03-01 ENCOUNTER — Encounter (HOSPITAL_COMMUNITY): Payer: Self-pay

## 2015-03-01 DIAGNOSIS — Z86718 Personal history of other venous thrombosis and embolism: Secondary | ICD-10-CM | POA: Insufficient documentation

## 2015-03-01 DIAGNOSIS — Z96652 Presence of left artificial knee joint: Secondary | ICD-10-CM | POA: Diagnosis not present

## 2015-03-01 DIAGNOSIS — I82409 Acute embolism and thrombosis of unspecified deep veins of unspecified lower extremity: Secondary | ICD-10-CM

## 2015-03-01 DIAGNOSIS — Z95828 Presence of other vascular implants and grafts: Secondary | ICD-10-CM | POA: Insufficient documentation

## 2015-03-01 DIAGNOSIS — Z4689 Encounter for fitting and adjustment of other specified devices: Secondary | ICD-10-CM | POA: Diagnosis present

## 2015-03-01 DIAGNOSIS — Z7901 Long term (current) use of anticoagulants: Secondary | ICD-10-CM | POA: Insufficient documentation

## 2015-03-01 DIAGNOSIS — Z86711 Personal history of pulmonary embolism: Secondary | ICD-10-CM | POA: Insufficient documentation

## 2015-03-01 LAB — CBC WITH DIFFERENTIAL/PLATELET
BASOS ABS: 0 10*3/uL (ref 0.0–0.1)
Basophils Relative: 1 %
EOS PCT: 2 %
Eosinophils Absolute: 0.1 10*3/uL (ref 0.0–0.7)
HEMATOCRIT: 42 % (ref 39.0–52.0)
Hemoglobin: 13.6 g/dL (ref 13.0–17.0)
LYMPHS PCT: 23 %
Lymphs Abs: 1.1 10*3/uL (ref 0.7–4.0)
MCH: 30 pg (ref 26.0–34.0)
MCHC: 32.4 g/dL (ref 30.0–36.0)
MCV: 92.5 fL (ref 78.0–100.0)
MONOS PCT: 11 %
Monocytes Absolute: 0.5 10*3/uL (ref 0.1–1.0)
NEUTROS ABS: 3.1 10*3/uL (ref 1.7–7.7)
Neutrophils Relative %: 63 %
Platelets: ADEQUATE 10*3/uL (ref 150–400)
RBC: 4.54 MIL/uL (ref 4.22–5.81)
RDW: 14.1 % (ref 11.5–15.5)
WBC: 4.8 10*3/uL (ref 4.0–10.5)

## 2015-03-01 LAB — BASIC METABOLIC PANEL
ANION GAP: 9 (ref 5–15)
BUN: 17 mg/dL (ref 6–20)
CHLORIDE: 105 mmol/L (ref 101–111)
CO2: 24 mmol/L (ref 22–32)
Calcium: 8.9 mg/dL (ref 8.9–10.3)
Creatinine, Ser: 0.83 mg/dL (ref 0.61–1.24)
GFR calc Af Amer: >60 mL/min (ref >60)
GLUCOSE: 115 mg/dL — AB (ref 65–99)
POTASSIUM: 4.3 mmol/L (ref 3.5–5.1)
Sodium: 138 mmol/L (ref 135–145)

## 2015-03-01 LAB — PROTIME-INR
INR: 2.46 — ABNORMAL HIGH (ref 0.00–1.49)
Prothrombin Time: 25.6 seconds — ABNORMAL HIGH (ref 11.6–15.2)

## 2015-03-01 MED ORDER — FENTANYL CITRATE (PF) 100 MCG/2ML IJ SOLN
INTRAMUSCULAR | Status: AC
  Filled 2015-03-01: qty 2

## 2015-03-01 MED ORDER — MIDAZOLAM HCL 2 MG/2ML IJ SOLN
INTRAMUSCULAR | Status: AC | PRN
  Administered 2015-03-01 (×2): 0.5 mg via INTRAVENOUS
  Administered 2015-03-01: 1 mg via INTRAVENOUS

## 2015-03-01 MED ORDER — MIDAZOLAM HCL 2 MG/2ML IJ SOLN
INTRAMUSCULAR | Status: AC
  Filled 2015-03-01: qty 4

## 2015-03-01 MED ORDER — SODIUM CHLORIDE 0.9 % IV SOLN
INTRAVENOUS | Status: DC
  Administered 2015-03-01: 12:00:00 via INTRAVENOUS

## 2015-03-01 MED ORDER — DIPHENHYDRAMINE HCL 50 MG/ML IJ SOLN: 12.5000 mg | Freq: Four times a day (QID) | INTRAMUSCULAR | Status: DC | PRN

## 2015-03-01 MED ORDER — SODIUM CHLORIDE 0.9% FLUSH: 9.0000 mL | INTRAVENOUS | Status: DC | PRN

## 2015-03-01 MED ORDER — LIDOCAINE HCL 1 % IJ SOLN
INTRAMUSCULAR | Status: AC
  Filled 2015-03-01: qty 20

## 2015-03-01 MED ORDER — DIPHENHYDRAMINE HCL 12.5 MG/5ML PO ELIX
12.5000 mg | ORAL_SOLUTION | Freq: Four times a day (QID) | ORAL | Status: DC | PRN
  Filled 2015-03-01: qty 5

## 2015-03-01 MED ORDER — NALOXONE HCL 0.4 MG/ML IJ SOLN: 0.4000 mg | INTRAMUSCULAR | Status: DC | PRN

## 2015-03-01 MED ORDER — HYDROMORPHONE 1 MG/ML IV SOLN: INTRAVENOUS | Status: DC

## 2015-03-01 MED ORDER — ONDANSETRON HCL 4 MG/2ML IJ SOLN: 4.0000 mg | Freq: Four times a day (QID) | INTRAMUSCULAR | Status: DC | PRN

## 2015-03-01 MED ORDER — IOHEXOL 300 MG/ML  SOLN
60.0000 mL | Freq: Once | INTRAMUSCULAR | Status: AC | PRN
  Administered 2015-03-01: 60 mL via INTRAVENOUS

## 2015-03-01 MED ORDER — FENTANYL CITRATE (PF) 100 MCG/2ML IJ SOLN
INTRAMUSCULAR | Status: AC | PRN
  Administered 2015-03-01: 50 ug via INTRAVENOUS

## 2015-03-01 NOTE — Discharge Instructions (Signed)
Call [facility] for any bleeding, pain not relieved by Tylenol, fever, or any questions or problems.leave dressing on and in place for 24 hours then may remove and bathe as usual. Quiet rest remainder of day. No change in diet or medications.   Incision Care An incision is when a surgeon cuts into your body. After surgery, the incision needs to be cared for properly to prevent infection.  HOW TO CARE FOR YOUR INCISION  Take medicines only as directed by your health care provider.  There are many different ways to close and cover an incision, including stitches, skin glue, and adhesive strips. Follow your health care provider's instructions on:  Incision care.  Bandage (dressing) changes and removal.  Incision closure removal.  Do not take baths, swim, or use a hot tub until your health care provider approves. You may shower as directed by your health care provider.  Resume your normal diet and activities as directed.  Use anti-itch medicine (such as an antihistamine) as directed by your health care provider. The incision may itch while it is healing. Do not pick or scratch at the incision.  Drink enough fluid to keep your urine clear or pale yellow. SEEK MEDICAL CARE IF:   You have drainage, redness, swelling, or pain at your incision site.  You have muscle aches, chills, or a general ill feeling.  You notice a bad smell coming from the incision or dressing.  Your incision edges separate after the sutures, staples, or skin adhesive strips have been removed.  You have persistent nausea or vomiting.  You have a fever.  You are dizzy. SEEK IMMEDIATE MEDICAL CARE IF:   You have a rash.  You faint.  You have difficulty breathing. MAKE SURE YOU:   Understand these instructions.  Will watch your condition.  Will get help right away if you are not doing well or get worse.   This information is not intended to replace advice given to you by your health care  provider. Make sure you discuss any questions you have with your health care provider.   Document Released: 08/05/2004 Document Revised: 02/06/2014 Document Reviewed: 03/12/2013 Elsevier Interactive Patient Education 2016 Elsevier Inc. Moderate Conscious Sedation, Adult Sedation is the use of medicines to promote relaxation and relieve discomfort and anxiety. Moderate conscious sedation is a type of sedation. Under moderate conscious sedation you are less alert than normal but are still able to respond to instructions or stimulation. Moderate conscious sedation is used during short medical and dental procedures. It is milder than deep sedation or general anesthesia and allows you to return to your regular activities sooner. LET [facility] CARE PROVIDER KNOW ABOUT:   Any allergies you have.  All medicines you are taking, including vitamins, herbs, eye drops, creams, and over-the-counter medicines.  Use of steroids (by mouth or creams).  Previous problems you or members of your family have had with the use of anesthetics.  Any blood disorders you have.  Previous surgeries you have had.  Medical conditions you have.  Possibility of pregnancy, if this applies.  Use of cigarettes, alcohol, or illegal drugs. RISKS AND COMPLICATIONS Generally, this is a safe procedure. However, as with any procedure, problems can occur. Possible problems include:  Oversedation.  Trouble breathing on your own. You may need to have a breathing tube until you are awake and breathing on your own.  Allergic reaction to any of the medicines used for the procedure. BEFORE THE PROCEDURE  You may have  blood tests done. These tests can help show how well your kidneys and liver are working. They can also show how well your blood clots.  A physical exam will be done.  Only take medicines as directed by your health care provider. You may need to stop taking medicines (such as blood thinners, aspirin, or  nonsteroidal anti-inflammatory drugs) before the procedure.   Do not eat or drink at least 6 hours before the procedure or as directed by your health care provider.  Arrange for a responsible adult, family member, or friend to take you home after the procedure. He or she should stay with you for at least 24 hours after the procedure, until the medicine has worn off. PROCEDURE   An intravenous (IV) catheter will be inserted into one of your veins. Medicine will be able to flow directly into your body through this catheter. You may be given medicine through this tube to help prevent pain and help you relax.  The medical or dental procedure will be done. AFTER THE PROCEDURE  You will stay in a recovery area until the medicine has worn off. Your blood pressure and pulse will be checked.   Depending on the procedure you had, you may be allowed to go home when you can tolerate liquids and your pain is under control.   This information is not intended to replace advice given to you by your health care provider. Make sure you discuss any questions you have with your health care provider.   Document Released: 10/11/2000 Document Revised: 02/06/2014 Document Reviewed: 09/23/2012 Elsevier Interactive Patient Education [employer].

## 2015-03-01 NOTE — Sedation Documentation (Signed)
Patient denies pain and is resting comfortably.  

## 2015-03-01 NOTE — Procedures (Signed)
IVC filter retrieval No comp/EBL 

## 2015-03-01 NOTE — Procedures (Deleted)
B UAE No comp/EBL 

## 2015-03-01 NOTE — Progress Notes (Signed)
Patient ID: [name] [name], male   DOB: [birth year], 75 y.o.   MRN: [id number]    Referring Physician(s): Swinteck,B  Chief Complaint:  "I'm here to get my filter out"  Subjective: [name] [name] is a 75 y.o. male who had a prior history of DVT and small pulmonary embolus postoperatively. Prior to his recent left knee arthroplasty, he had a preoperative IVC filter placed 11/16/2014. Left knee replacement performed the following day. He returned on 01/12/2015 for filter removal. Prior to removing the filter, repeat bilateral lower extremity duplex ultrasound was performed. This demonstrated a new right calf tibial and peroneal DVT. Left lower extremity was negative for DVT. Because of this, he was placed on Xarelto. Filter was not removed on 01/12/2015. He was seen by Dr. Annamaria Boots on 02/24/15 to assess for IVC filter removal. Repeat LE venous ultrasound on 02/24/15 demonstrated no evidence of significant residual DVT. Patient is ambulating very well. He continues to recover from the left knee replacement. He presents today for filter removal. He currently denies fever, headache, chest pain, dyspnea, cough, abdominal/back pain, nausea/vomiting or bleeding.   Allergies: Review of patient's allergies indicates no known allergies.  Medications: Prior to Admission medications   Medication Sig Start Date End Date Taking? Authorizing Provider  rivaroxaban (XARELTO) 20 MG TABS tablet Take 20 mg by mouth daily with supper.   Yes Historical Provider, MD     Vital Signs: Blood pressure 138/80, heart rate 93, temperature 97.8, respirations 20, oxygen saturation is 99% room air  Physical Exam   patient awake, alert. Chest with distant breath sounds bilaterally; heart with regular rate /rhythm. Abdomen soft, positive bowel sounds, nontender. Lower extremities with no significant edema.  Imaging: No results found.  Labs:  CBC:  Recent Labs  11/19/14 0527 01/12/15 1155 02/15/15 1515 03/01/15 1150    WBC 5.8 4.8 5.4 4.8  HGB 11.2* 13.4 14.8 13.6  HCT 33.3* 39.6 43.6 42.0  PLT 173 PLATELET CLUMPS NOTED ON SMEAR, UNABLE TO ESTIMATE 188 PLATELET CLUMPS NOTED ON SMEAR, COUNT APPEARS ADEQUATE    COAGS:  Recent Labs  03/02/14 1444 11/09/14 1120 11/16/14 0920 01/12/15 1155 03/01/15 1150  INR 1.63* 1.08 1.02 1.07 2.46*  APTT 49* 31 30 29   --     BMP:  Recent Labs  11/16/14 0920 11/18/14 0538 01/12/15 1155 02/15/15 1515 03/01/15 1150  NA 140 134* 139 141 138  K 3.9 4.1 3.9 4.6 4.3  CL 107 102 106  --  105  CO2 25 25 25 26 24   GLUCOSE 109* 121* 106* 100 115*  BUN 15 17 11  12.6 17  CALCIUM 9.1 8.4* 8.8* 9.3 8.9  CREATININE 0.92 0.98 0.80 1.0 0.83  GFRNONAA >60 >60 >60  --  >60  GFRAA >60 >60 >60  --  >60    LIVER FUNCTION TESTS:  Recent Labs  11/09/14 1120 02/15/15 1515  BILITOT 0.8 0.44  AST 14* 9  ALT 11* <9  ALKPHOS 64 72  PROT 7.9 8.1  ALBUMIN 4.1 3.9    Assessment and Plan: [name] [name] is a 75 y.o. male who had a prior history of DVT and small pulmonary embolus postoperatively. Prior to his recent left knee arthroplasty, he had a preoperative IVC filter placed 11/16/2014. Left knee replacement performed the following day. He returned on 01/12/2015 for filter removal. Prior to removing the filter, repeat bilateral lower extremity duplex ultrasound was performed. This demonstrated a new right calf tibial and peroneal DVT. Left lower extremity was  negative for DVT. Because of this, he was placed on Xarelto. Filter was not removed on 01/12/2015. He was seen by Dr. Annamaria Boots on 02/24/15 to assess for IVC filter removal. Repeat LE venous ultrasound on 02/24/15 demonstrated no evidence of significant residual DVT. Patient is ambulating very well. He continues to recover from the left knee replacement. He presents today for filter removal. Details/risks of procedure, including but not limited to, internal bleeding, infection, inability to remove filter, contrast  nephropathy, discussed with patient with his understanding and consent.   Electronically Signed: D. Rowe Robert 03/01/2015, 12:53 PM   I spent a total of 15 minutes at the the patient's bedside AND on the patient's hospital floor or unit, greater than 50% of which was counseling/coordinating care for IVC filter removal

## 2016-12-18 IMAGING — DX DG FEMUR 2+V*L*
4 series · 4 of 4 positions shown · non-contrast
Comparison: 01/23/2014

CLINICAL DATA: Intra medullary nail placement in the left femur

EXAM:
DG FEMUR 2+V*L*

[femur ap (1 of 2)]
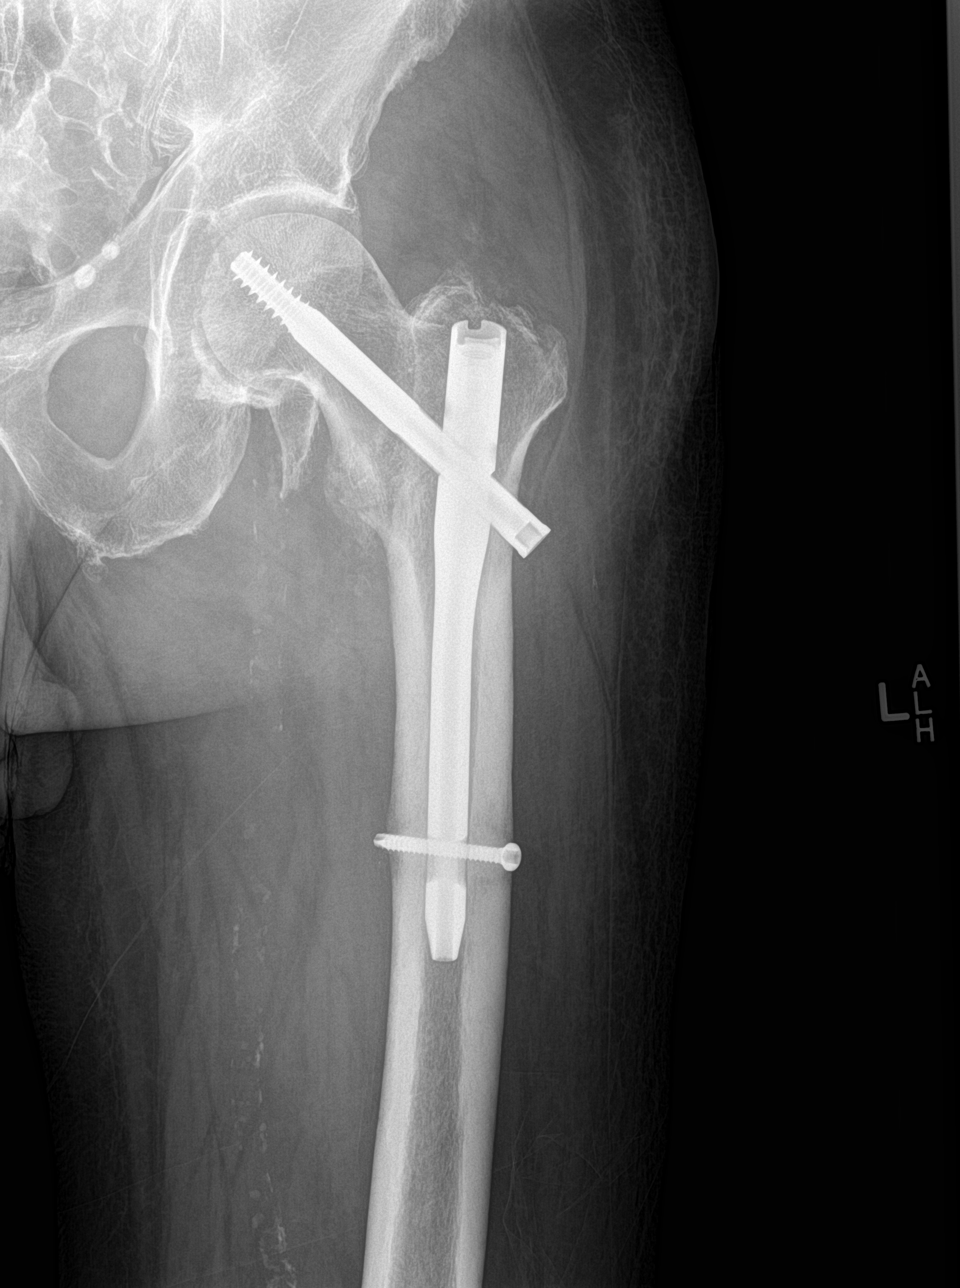

[femur ap (2 of 2)]
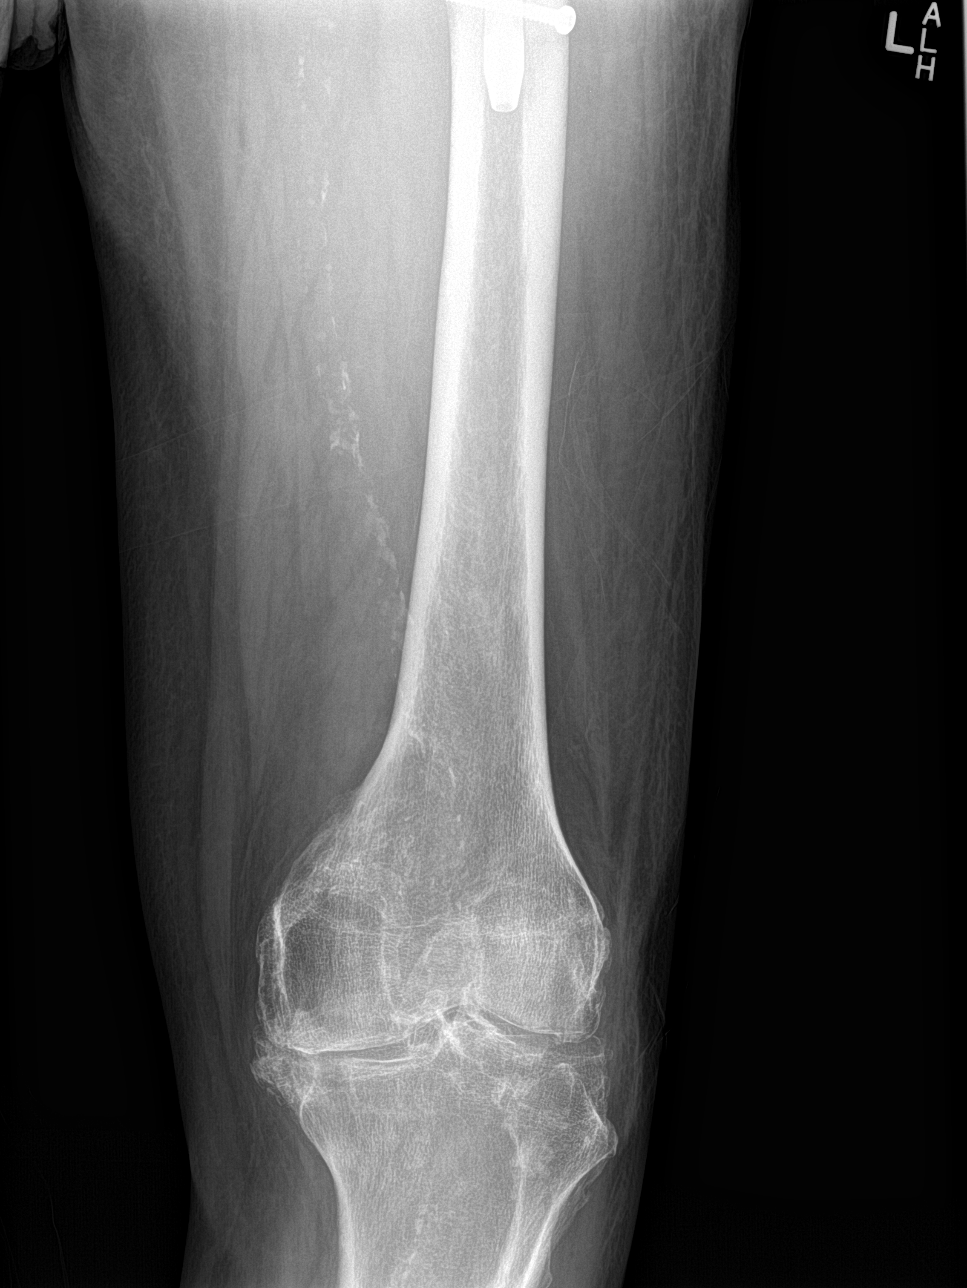

[femur lat (1 of 2)]
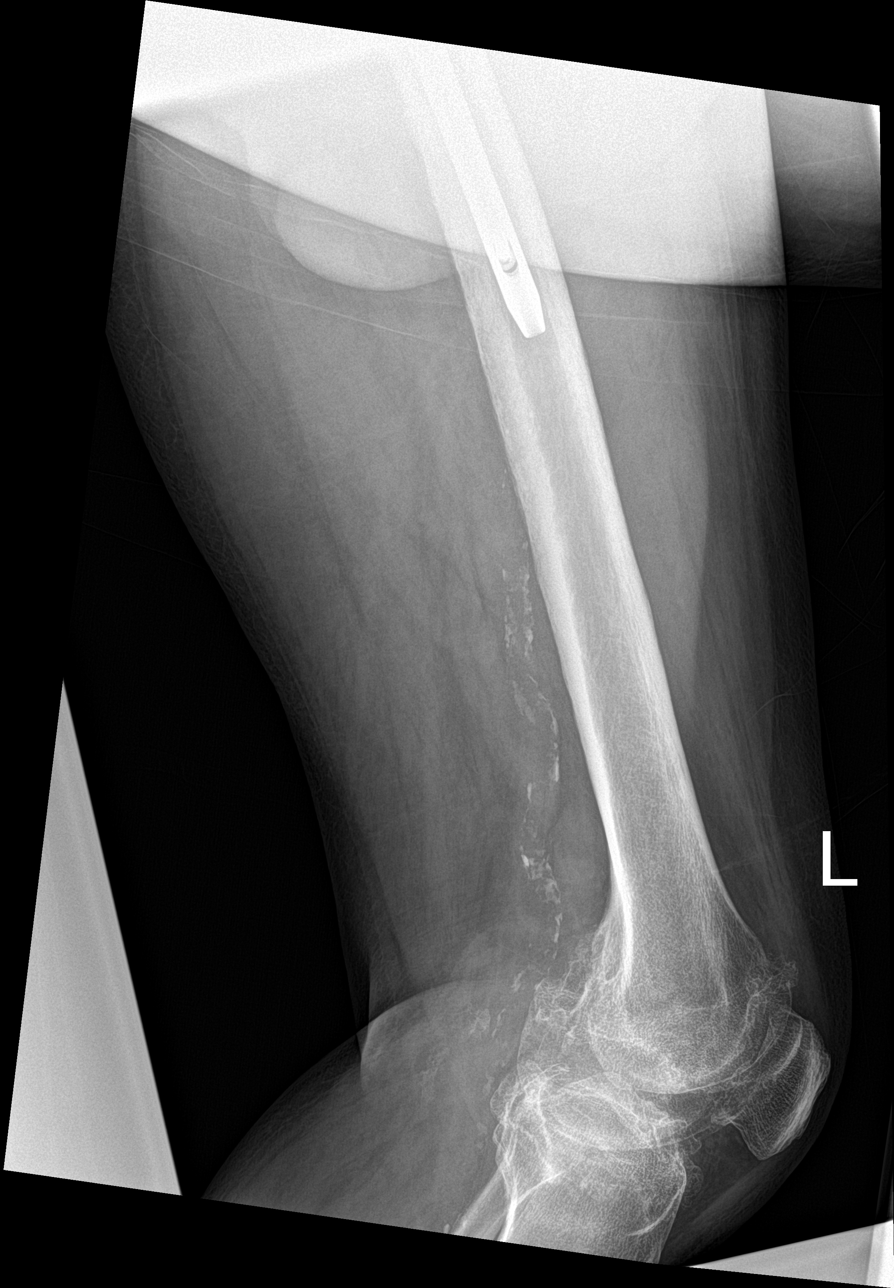

[femur lat (2 of 2)]
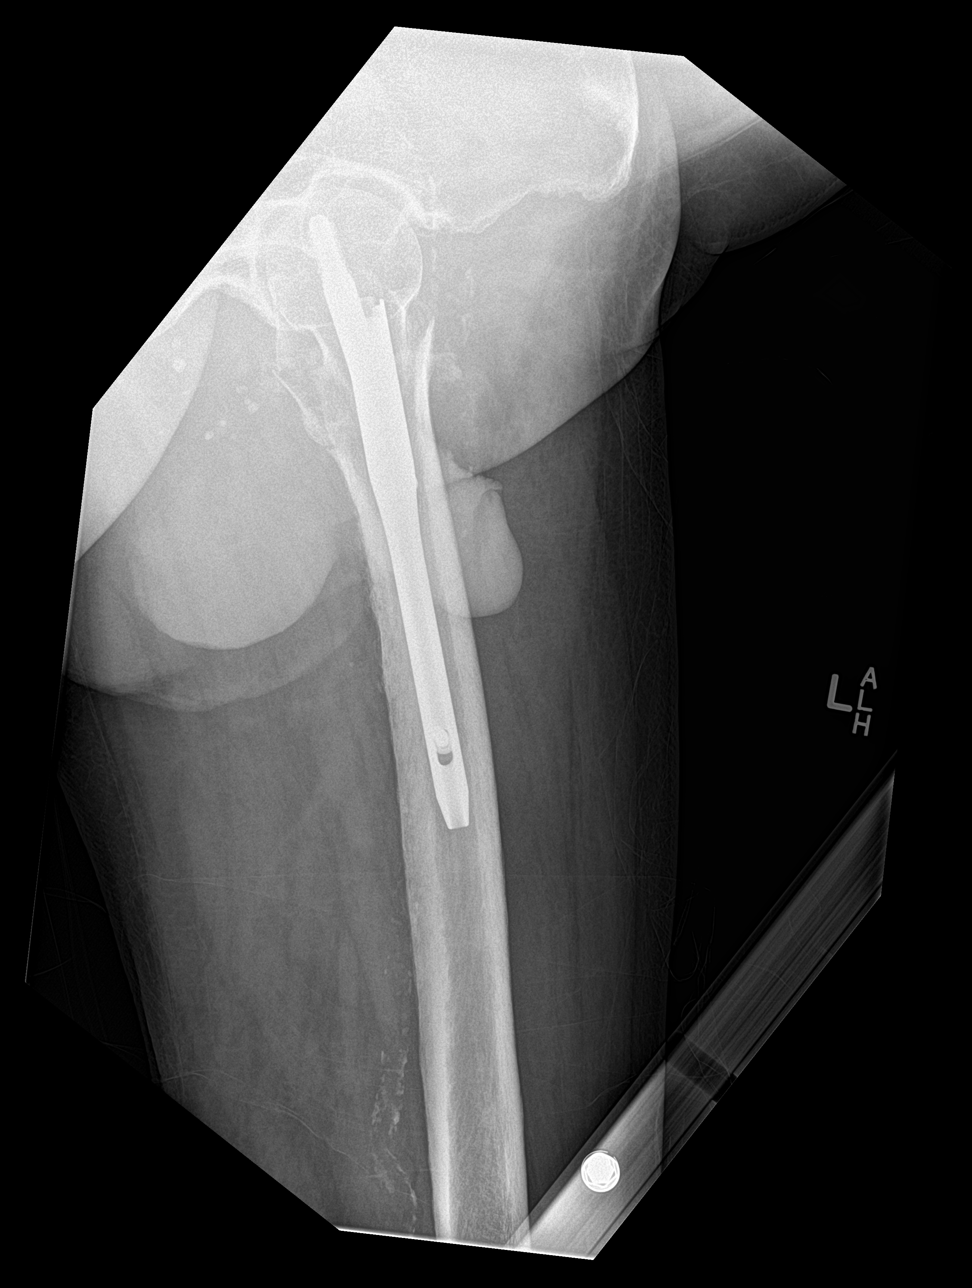

[4 of 4 positions shown; findings below may reference images not displayed]

FINDINGS: There is a left intertrochanteric fracture transfixed with an intra
medullary nail and cannulated femoral neck screw without failure or
complication. There is medial displacement of the lesser trochanter.
The intertrochanteric fracture cleft persists. There is generalized
osteopenia. There is peripheral vascular atherosclerotic disease.
IMPRESSION: Ununited left intertrochanteric fracture transfixed with an
intramedullary nail.

## 2020-03-17 DIAGNOSIS — Z636 Dependent relative needing care at home: Secondary | ICD-10-CM | POA: Diagnosis not present

## 2020-03-17 DIAGNOSIS — I499 Cardiac arrhythmia, unspecified: Secondary | ICD-10-CM | POA: Diagnosis not present

## 2020-06-07 DIAGNOSIS — G5601 Carpal tunnel syndrome, right upper limb: Secondary | ICD-10-CM | POA: Diagnosis not present

## 2020-06-07 DIAGNOSIS — Z79899 Other long term (current) drug therapy: Secondary | ICD-10-CM | POA: Diagnosis not present

## 2020-06-07 DIAGNOSIS — E7800 Pure hypercholesterolemia, unspecified: Secondary | ICD-10-CM | POA: Diagnosis not present

## 2020-06-07 DIAGNOSIS — M1711 Unilateral primary osteoarthritis, right knee: Secondary | ICD-10-CM | POA: Diagnosis not present

## 2020-06-07 DIAGNOSIS — I714 Abdominal aortic aneurysm, without rupture: Secondary | ICD-10-CM | POA: Diagnosis not present

## 2020-06-07 DIAGNOSIS — Z0001 Encounter for general adult medical examination with abnormal findings: Secondary | ICD-10-CM | POA: Diagnosis not present

## 2020-06-07 DIAGNOSIS — Z86711 Personal history of pulmonary embolism: Secondary | ICD-10-CM | POA: Diagnosis not present

## 2020-06-08 ENCOUNTER — Other Ambulatory Visit: Payer: Self-pay | Admitting: Family Medicine

## 2020-07-30 ENCOUNTER — Other Ambulatory Visit: Payer: Self-pay | Admitting: Family Medicine

## 2020-07-30 DIAGNOSIS — I714 Abdominal aortic aneurysm, without rupture, unspecified: Secondary | ICD-10-CM

## 2020-08-05 ENCOUNTER — Ambulatory Visit
Admission: RE | Admit: 2020-08-05 | Discharge: 2020-08-05 | Disposition: A | Discharge status: Home / Self Care | Payer: Medicare Other | Source: Ambulatory Visit | Attending: Family Medicine | Admitting: Family Medicine

## 2020-08-05 DIAGNOSIS — I714 Abdominal aortic aneurysm, without rupture, unspecified: Secondary | ICD-10-CM

## 2020-08-26 DIAGNOSIS — U071 COVID-19: Secondary | ICD-10-CM | POA: Diagnosis not present

## 2021-03-30 DIAGNOSIS — I491 Atrial premature depolarization: Secondary | ICD-10-CM | POA: Diagnosis not present

## 2021-04-07 ENCOUNTER — Ambulatory Visit: Payer: Medicare Other | Admitting: Podiatry

## 2021-04-07 ENCOUNTER — Other Ambulatory Visit: Payer: Self-pay

## 2021-04-07 DIAGNOSIS — D239 Other benign neoplasm of skin, unspecified: Secondary | ICD-10-CM | POA: Diagnosis not present

## 2021-04-07 DIAGNOSIS — R0989 Other specified symptoms and signs involving the circulatory and respiratory systems: Secondary | ICD-10-CM

## 2021-04-07 DIAGNOSIS — Z720 Tobacco use: Secondary | ICD-10-CM | POA: Diagnosis not present

## 2021-04-08 ENCOUNTER — Ambulatory Visit (HOSPITAL_COMMUNITY)
Admission: RE | Admit: 2021-04-08 | Discharge: 2021-04-08 | Disposition: A | Discharge status: Home / Self Care | Payer: Medicare Other | Source: Ambulatory Visit | Attending: Podiatry | Admitting: Podiatry

## 2021-04-08 DIAGNOSIS — R0989 Other specified symptoms and signs involving the circulatory and respiratory systems: Secondary | ICD-10-CM | POA: Diagnosis not present

## 2021-04-08 DIAGNOSIS — Z720 Tobacco use: Secondary | ICD-10-CM | POA: Diagnosis not present

## 2021-04-11 ENCOUNTER — Encounter: Payer: Self-pay | Admitting: Podiatry

## 2021-04-11 NOTE — Progress Notes (Signed)
?  Subjective:  ?Patient ID: [name] [name], male    DOB: 12-Jul-1940,  MRN: [id number] ? ?Chief Complaint  ?Patient presents with  ? Callouses  ?  Corn lesion on right foot- couple years now- mentioned he tries to debride it but it comes back and painful  ? ? ?81 y.o. male presents with the above complaint. History confirmed with patient.  Feels like it is slowly been getting larger over the last year or 2 ? ?Objective:  ?Physical Exam: ?warm, good capillary refill, no trophic changes or ulcerative lesions, difficult to palpate DP and PT pulses, normal sensory exam, and large verrucous hyperkeratotic lesion medial right first MTPJ, debridement of the hyperkeratotic areas there is papillary ingrowth of bleeding tissue. ? ?Assessment:  ? ?1. Verrucous papilloma   ?2. Tobacco use   ?3. Decreased pedal pulses   ? ? ? ?Plan:  ?Patient was evaluated and treated and all questions answered. ? ?Discussed with him I do not think this is a routine hyperkeratotic lesion.  I would recommend excisional biopsy.  I recommended ABIs prior to planning for surgical excision, his pedal pulses were difficult to palpate and he is an active smoker and has been for many years.  I will see him back following the ABIs for surgical consult. ? ?No follow-ups on file.  ? ?

## 2021-05-09 ENCOUNTER — Ambulatory Visit (HOSPITAL_COMMUNITY)
Admission: EM | Admit: 2021-05-09 | Discharge: 2021-05-09 | Disposition: A | Discharge status: Home / Self Care | Payer: Medicare Other | Attending: Sports Medicine | Admitting: Sports Medicine

## 2021-05-09 ENCOUNTER — Encounter (HOSPITAL_COMMUNITY): Payer: Self-pay | Admitting: Emergency Medicine

## 2021-05-09 ENCOUNTER — Ambulatory Visit: Payer: Medicare Other | Admitting: Podiatry

## 2021-05-09 ENCOUNTER — Ambulatory Visit (INDEPENDENT_AMBULATORY_CARE_PROVIDER_SITE_OTHER): Payer: Medicare Other

## 2021-05-09 DIAGNOSIS — D239 Other benign neoplasm of skin, unspecified: Secondary | ICD-10-CM | POA: Diagnosis not present

## 2021-05-09 DIAGNOSIS — S42222A 2-part displaced fracture of surgical neck of left humerus, initial encounter for closed fracture: Secondary | ICD-10-CM

## 2021-05-09 DIAGNOSIS — S42212A Unspecified displaced fracture of surgical neck of left humerus, initial encounter for closed fracture: Secondary | ICD-10-CM | POA: Diagnosis not present

## 2021-05-09 DIAGNOSIS — M25512 Pain in left shoulder: Secondary | ICD-10-CM | POA: Diagnosis not present

## 2021-05-09 DIAGNOSIS — W19XXXA Unspecified fall, initial encounter: Secondary | ICD-10-CM

## 2021-05-09 MED ORDER — CLOBETASOL PROPIONATE 0.05 % EX OINT: 1.0000 "application " | TOPICAL_OINTMENT | Freq: Two times a day (BID) | CUTANEOUS | 0 refills | Status: DC

## 2021-05-09 MED ORDER — HYDROCODONE-ACETAMINOPHEN 5-325 MG PO TABS: 1.0000 | ORAL_TABLET | Freq: Four times a day (QID) | ORAL | 0 refills | Status: AC | PRN

## 2021-05-09 NOTE — ED Triage Notes (Signed)
Patient c/o Lft shoulder injury x 1 day.  ? ?Patient endorses a fall after tripping over a chair in the dark. Patient endorses " I put my hands in front of my when I fell".  ? ?Patient has left arm in sling upon arrival.  ? ?Patient denies LOC or hitting head.  ? ?Patient has taken Advil with no relief of symptoms.  ?

## 2021-05-09 NOTE — ED Provider Notes (Signed)
?[employer] ? ? ? ?CSN: [id number] ?Arrival date & time: 05/09/21  1042 ? ? ?  ? ?History   ?Chief Complaint ?Chief Complaint  ?Patient presents with  ? Shoulder Injury  ? ? ?HPI ?[name] [name] is a 81 y.o. male here for left shoulder pain. ? ? ?Shoulder Injury ?Pertinent negatives include no headaches.  ? ?Patient was moving around in the dark last night when he tripped over a chair, and tried to brace himself by putting his arms out in front of him.  He landed on his left hand and shoulder and felt a sharp pain at the left aspect of the shoulder.  He was unable to move the arm following the incident.  His wife went out and got a sling for him over-the-counter and he has been using this during this time.  He denied hitting his head, no loss of consciousness, no open wound.  He does note some swelling, pain and some bruising over the left proximal shoulder.  He has been taking Advil without relief of symptoms.  He states his pain is quite severe.  Denies any previous fracture or injury to the shoulder or arm before. ? ?No longer on xarelto --> only took for about one month. No longer taking.  He is unsure why he took this, although on chart review it does appear he has a history of PE and DVT.  He currently states he is not taking Xarelto. ? ? ? ?Past Medical History:  ?Diagnosis Date  ? BPH (benign prostatic hypertrophy)   ? Difficulty sleeping   ? Frequency of urination   ? History of DVT (deep vein thrombosis) JAN 2016  ? Lumbar disc disease   ? Nocturia   ? Osteoarthritis   ? PE (pulmonary embolism) JAN 2016  ? ? ?Patient Active Problem List  ? Diagnosis Date Noted  ? Presence of IVC filter   ? S/P insertion of IVC (inferior vena caval) filter   ? Primary osteoarthritis of left knee 11/17/2014  ? Pulmonary embolus ([place])   ? Constipation 03/19/2014  ? DVT (deep venous thrombosis) ([place]) 03/03/2014  ? Acute pulmonary embolism ([place]) 03/03/2014  ? Syncopal episodes 02/28/2014  ? Hip fracture requiring  operative repair ([place]) 01/22/2014  ? Hip fracture, left ([place]) 01/22/2014  ? BPH (benign prostatic hypertrophy) 01/22/2014  ? Hip fracture ([place]) 01/22/2014  ? ? ?Past Surgical History:  ?Procedure Laterality Date  ? INTRAMEDULLARY (IM) NAIL INTERTROCHANTERIC Left 01/22/2014  ? Procedure: INTRAMEDULLARY (IM) NAIL INTERTROCHANTRIC;  Surgeon: Melina Schools, MD;  Location: [place];  Service: Orthopedics;  Laterality: Left;  ? IVC FILTER PLACEMENT ([facility])  11/16/14  ? KNEE ARTHROPLASTY Left 11/17/2014  ? Procedure: Total knee Arthroplasty;  Surgeon: Rod Can, MD;  Location: [facility];  Service: Orthopedics;  Laterality: Left;  ? LUMBAR LAMINECTOMY    ? ? ? ? ? ?Home Medications   ? ?Prior to Admission medications   ?Medication Sig Start Date End Date Taking? Authorizing Provider  ?HYDROcodone-acetaminophen (NORCO/VICODIN) 5-325 MG tablet Take 1-2 tablets by mouth every 6 (six) hours as needed for up to 3 days. 05/09/21 05/12/21 Yes Elba Barman, DO  ?clobetasol ointment (TEMOVATE) 7.42 % Apply 1 application. topically 2 (two) times daily. 05/09/21   Criselda Peaches, DPM  ?rivaroxaban (XARELTO) 20 MG TABS tablet Take 20 mg by mouth daily with supper. ?Patient not taking: Reported on 05/09/2021    [provider]  ? ? ?Family History ?History reviewed. No pertinent  family history. ? ?Social History ?Social History  ? ?Tobacco Use  ? Smoking status: Every Day  ?  Packs/day: 0.25  ?  Types: Cigarettes  ? Smokeless tobacco: Never  ?Substance Use Topics  ? Alcohol use: No  ?  Alcohol/week: 0.0 standard drinks  ? Drug use: No  ? ? ? ?Allergies   ?Patient has no known allergies. ? ? ?Review of Systems ?Review of Systems  ?Constitutional:  Negative for activity change.  ?Musculoskeletal:  Positive for arthralgias (left shoulder) and joint swelling.  ?Skin:  Positive for color change (bruising).  ?Neurological:  Negative for dizziness and headaches.  ? ? ?Physical Exam ?Triage Vital Signs ?ED Triage Vitals  ?Enc Vitals  Group  ?   BP 05/09/21 1115 115/67  ?   Pulse Rate 05/09/21 1115 97  ?   Resp 05/09/21 1115 16  ?   Temp 05/09/21 1115 98.1 ?F (36.7 ?C)  ?   Temp Source 05/09/21 1115 Oral  ?   SpO2 05/09/21 1115 95 %  ?   Weight --   ?   Height --   ?   Head Circumference --   ?   Peak Flow --   ?   Pain Score 05/09/21 1114 10  ?   Pain Loc --   ?   Pain Edu? --   ?   Excl. in [place]? --   ? ?No data found. ? ?Updated Vital Signs ?BP 115/67 (BP Location: Right Arm)   Pulse 97   Temp 98.1 ?F (36.7 ?C) (Oral)   Resp 16   SpO2 95%  ? ?Physical Exam ?Gen: Well-appearing, in no acute distress; non-toxic ?CV: Regular Rate. Well-perfused. Warm.  ?Resp: Breathing unlabored on room air; no wheezing. ?Psych: Fluid speech in conversation; appropriate affect; normal thought process ?Neuro: Sensation intact throughout. No gross coordination deficits.  ?MSK:  ?- LUE/shoulder: + TTP generally throughout the proximal humerus.  There is no AC joint TTP.  No scapular TTP.  There is no TTP of the elbow or any bony TTP distally.  Full range of motion of the wrist and fingers.  There is some ecchymosis noted on the anterior proximal shoulder with soft tissue swelling noted.  Patient is immobilized in a sling. Patient any attempt at movement about the shoulder does cause quite significant pain.  He is able to gently flex and extend the elbow without too much pain. Sensation to light touch intact throughout the deltoid and dorsal and volar aspect of the upper extremity.  Cap refill less than 2 seconds, radial pulse 2/4.  Neurovascular intact distally. ? ? ?UC Treatments / Results  ?Labs ?(all labs ordered are listed, but only abnormal results are displayed) ?Labs Reviewed - No data to display ? ?EKG ? ? ?Radiology ?DG Shoulder Left ? ?Result Date: 05/09/2021 ?CLINICAL DATA:  Fall, pain EXAM: LEFT SHOULDER - 2+ VIEW COMPARISON:  None. FINDINGS: Mildly displaced, impacted fracture of the surgical neck of the left humerus and likely the greater tuberosity.  Mild acromioclavicular and glenohumeral arthrosis. Partially imaged left chest is unremarkable. IMPRESSION: Mildly displaced, impacted fracture of the surgical neck of the left humerus and likely the greater tuberosity. Consider CT to further evaluate proximal humerus fracture anatomy. Electronically Signed   By: Delanna Ahmadi M.D.   On: 05/09/2021 11:41   ? ?Procedures ?Procedures (including critical care time) ? ?Medications Ordered in UC ?Medications - No data to display ? ?Initial Impression / Assessment and Plan / UC Course  ?  I have reviewed the triage vital signs and the nursing notes. ? ?Pertinent labs & imaging results that were available during my care of the patient were reviewed by me and considered in my medical decision making (see chart for details). ? ?  ? ?Acute fracture of surgical neck of humerus, left ?Fall, initial encounter ?Left shoulder pain ? ?Patient presents to the urgent care after a fall last night onto an outstretched hand.  X-ray demonstrates acute fracture of the surgical neck of the humerus and likely a greater tuberosity fracture.  There is mild displacement of the surgical neck, no significant angulation or shortening.  We will keep the patient immobilized in a sling.  I did provide him a short course of Norco for 2 days for breakthrough pain until he is able to see orthopedics.  I did provide information for him to call local orthopedic to get in in the next 1-2 days.  He is to remain in the sling, icing until that time.  Strict return precautions were provided.  Follow-up with orthopedics. ?Final Clinical Impressions(s) / UC Diagnoses  ? ?Final diagnoses:  ?2-part displaced fracture of surgical neck of left humerus, initial encounter for closed fracture  ?Acute pain of left shoulder  ? ? ? ?Discharge Instructions   ? ?  ?Remain in sling ?Ice for pain control ? ?May take Norco (1 or 2 tablets) every 6 hours as needed ? ?Schedule appt with Dr. Tamera Punt or colleague at [facility] ? ? ? ? ?ED Prescriptions   ? ? Medication Sig Dispense Auth. Provider  ? HYDROcodone-acetaminophen (NORCO/VICODIN) 5-325 MG tablet Take 1-2 tablets by mouth every 6 (six) hours as needed for up to 3 days.

## 2021-05-09 NOTE — Progress Notes (Signed)
?  Subjective:  ?Patient ID: [name] [name], male    DOB: [birth year],  MRN: [id number] ? ?Chief Complaint  ?Patient presents with  ? Plantar Warts  ?  Right foot medial aspect patient reports some discomfort and states last visit the provider shaved the area down and it felt much better.   ? ? ?81 y.o. male presents with the above complaint. History confirmed with patient.  Returns for follow-up after having his circulation testing the lesion is slightly uncomfortable again but feels much better than it previously ? ?Objective:  ?Physical Exam: ?warm, good capillary refill, no trophic changes or ulcerative lesions, difficult to palpate DP and PT pulses, normal sensory exam, and large verrucous hyperkeratotic lesion medial right first MTPJ, debridement of the hyperkeratotic areas there is papillary ingrowth of bleeding tissue. ? ?Assessment:  ? ?1. Verrucous papilloma   ? ? ? ?Plan:  ?Patient was evaluated and treated and all questions answered. ? ?Has improved with debridement.  I debrided the lesion again today.  We also discussed excisional biopsy which I had recommended.  He would like to hold off on this for now.  I did discuss treating this such as lichen simplex chronicus and prescribed him Temovate ointment.  He will apply this daily.  He will return to see me as needed.  Advised on signs and symptoms of worsening or transformation of the skin that would necessitate a more urgent biopsy. ? ?Return if symptoms worsen or fail to improve.  ? ?

## 2021-05-09 NOTE — Discharge Instructions (Addendum)
Remain in sling ?Ice for pain control ? ?May take Norco (1 or 2 tablets) every 6 hours as needed ? ?Schedule appt with Dr. Tamera Punt or colleague at [facility] ?

## 2021-05-10 ENCOUNTER — Telehealth (HOSPITAL_COMMUNITY): Payer: Self-pay

## 2021-05-10 NOTE — Telephone Encounter (Signed)
Patient had called on 05/08/21 requesting xray CD for Ortho. Ive made the CD and informed patient it's at the front desk.  ?

## 2021-05-11 DIAGNOSIS — S42232A 3-part fracture of surgical neck of left humerus, initial encounter for closed fracture: Secondary | ICD-10-CM | POA: Diagnosis not present

## 2021-05-11 DIAGNOSIS — M25512 Pain in left shoulder: Secondary | ICD-10-CM | POA: Diagnosis not present

## 2021-05-25 DIAGNOSIS — M25512 Pain in left shoulder: Secondary | ICD-10-CM | POA: Diagnosis not present

## 2021-05-25 DIAGNOSIS — Z9889 Other specified postprocedural states: Secondary | ICD-10-CM | POA: Diagnosis not present

## 2021-06-15 DIAGNOSIS — M25512 Pain in left shoulder: Secondary | ICD-10-CM | POA: Diagnosis not present

## 2022-01-20 DIAGNOSIS — I714 Abdominal aortic aneurysm, without rupture, unspecified: Secondary | ICD-10-CM | POA: Diagnosis not present

## 2022-01-20 DIAGNOSIS — Z0001 Encounter for general adult medical examination with abnormal findings: Secondary | ICD-10-CM | POA: Diagnosis not present

## 2022-01-20 DIAGNOSIS — R7303 Prediabetes: Secondary | ICD-10-CM | POA: Diagnosis not present

## 2022-01-20 DIAGNOSIS — E7800 Pure hypercholesterolemia, unspecified: Secondary | ICD-10-CM | POA: Diagnosis not present

## 2022-01-20 DIAGNOSIS — Z79899 Other long term (current) drug therapy: Secondary | ICD-10-CM | POA: Diagnosis not present

## 2022-01-20 DIAGNOSIS — Z23 Encounter for immunization: Secondary | ICD-10-CM | POA: Diagnosis not present

## 2022-03-20 DIAGNOSIS — H5203 Hypermetropia, bilateral: Secondary | ICD-10-CM | POA: Diagnosis not present

## 2022-03-20 DIAGNOSIS — H2513 Age-related nuclear cataract, bilateral: Secondary | ICD-10-CM | POA: Diagnosis not present

## 2022-03-20 DIAGNOSIS — H524 Presbyopia: Secondary | ICD-10-CM | POA: Diagnosis not present

## 2022-03-30 DIAGNOSIS — H25812 Combined forms of age-related cataract, left eye: Secondary | ICD-10-CM | POA: Diagnosis not present

## 2022-03-30 DIAGNOSIS — H353132 Nonexudative age-related macular degeneration, bilateral, intermediate dry stage: Secondary | ICD-10-CM | POA: Diagnosis not present

## 2022-05-12 DIAGNOSIS — H269 Unspecified cataract: Secondary | ICD-10-CM | POA: Diagnosis not present

## 2022-05-12 DIAGNOSIS — H25812 Combined forms of age-related cataract, left eye: Secondary | ICD-10-CM | POA: Diagnosis not present

## 2022-05-26 DIAGNOSIS — H25811 Combined forms of age-related cataract, right eye: Secondary | ICD-10-CM | POA: Diagnosis not present

## 2022-05-26 DIAGNOSIS — H269 Unspecified cataract: Secondary | ICD-10-CM | POA: Diagnosis not present

## 2022-10-06 DIAGNOSIS — H353132 Nonexudative age-related macular degeneration, bilateral, intermediate dry stage: Secondary | ICD-10-CM | POA: Diagnosis not present

## 2023-01-03 ENCOUNTER — Other Ambulatory Visit: Payer: Self-pay | Admitting: Family Medicine

## 2023-01-03 DIAGNOSIS — Z79899 Other long term (current) drug therapy: Secondary | ICD-10-CM | POA: Diagnosis not present

## 2023-01-03 DIAGNOSIS — I491 Atrial premature depolarization: Secondary | ICD-10-CM | POA: Diagnosis not present

## 2023-01-03 DIAGNOSIS — F17210 Nicotine dependence, cigarettes, uncomplicated: Secondary | ICD-10-CM | POA: Diagnosis not present

## 2023-01-03 DIAGNOSIS — Z86711 Personal history of pulmonary embolism: Secondary | ICD-10-CM | POA: Diagnosis not present

## 2023-01-03 DIAGNOSIS — J449 Chronic obstructive pulmonary disease, unspecified: Secondary | ICD-10-CM | POA: Diagnosis not present

## 2023-01-03 DIAGNOSIS — Z0001 Encounter for general adult medical examination with abnormal findings: Secondary | ICD-10-CM | POA: Diagnosis not present

## 2023-01-03 DIAGNOSIS — I714 Abdominal aortic aneurysm, without rupture, unspecified: Secondary | ICD-10-CM

## 2023-01-03 DIAGNOSIS — E7800 Pure hypercholesterolemia, unspecified: Secondary | ICD-10-CM | POA: Diagnosis not present

## 2023-01-03 DIAGNOSIS — R7303 Prediabetes: Secondary | ICD-10-CM | POA: Diagnosis not present

## 2023-04-11 ENCOUNTER — Emergency Department (HOSPITAL_COMMUNITY)

## 2023-04-11 ENCOUNTER — Observation Stay (HOSPITAL_COMMUNITY)
Admission: EM | Admit: 2023-04-11 | Discharge: 2023-04-13 | Disposition: A | Discharge status: Home Health | Attending: Emergency Medicine | Admitting: Emergency Medicine

## 2023-04-11 DIAGNOSIS — L851 Acquired keratosis [keratoderma] palmaris et plantaris: Secondary | ICD-10-CM | POA: Diagnosis not present

## 2023-04-11 DIAGNOSIS — J9811 Atelectasis: Secondary | ICD-10-CM | POA: Diagnosis not present

## 2023-04-11 DIAGNOSIS — W19XXXA Unspecified fall, initial encounter: Secondary | ICD-10-CM | POA: Diagnosis not present

## 2023-04-11 DIAGNOSIS — J441 Chronic obstructive pulmonary disease with (acute) exacerbation: Secondary | ICD-10-CM | POA: Diagnosis not present

## 2023-04-11 DIAGNOSIS — F17210 Nicotine dependence, cigarettes, uncomplicated: Secondary | ICD-10-CM | POA: Insufficient documentation

## 2023-04-11 DIAGNOSIS — E785 Hyperlipidemia, unspecified: Secondary | ICD-10-CM | POA: Insufficient documentation

## 2023-04-11 DIAGNOSIS — E872 Acidosis, unspecified: Secondary | ICD-10-CM

## 2023-04-11 DIAGNOSIS — J101 Influenza due to other identified influenza virus with other respiratory manifestations: Principal | ICD-10-CM | POA: Insufficient documentation

## 2023-04-11 DIAGNOSIS — L84 Corns and callosities: Secondary | ICD-10-CM | POA: Insufficient documentation

## 2023-04-11 DIAGNOSIS — M799 Soft tissue disorder, unspecified: Secondary | ICD-10-CM

## 2023-04-11 DIAGNOSIS — R509 Fever, unspecified: Secondary | ICD-10-CM | POA: Insufficient documentation

## 2023-04-11 DIAGNOSIS — Z86718 Personal history of other venous thrombosis and embolism: Secondary | ICD-10-CM | POA: Insufficient documentation

## 2023-04-11 DIAGNOSIS — A419 Sepsis, unspecified organism: Secondary | ICD-10-CM | POA: Diagnosis not present

## 2023-04-11 DIAGNOSIS — R0689 Other abnormalities of breathing: Secondary | ICD-10-CM | POA: Diagnosis not present

## 2023-04-11 DIAGNOSIS — A4189 Other specified sepsis: Secondary | ICD-10-CM

## 2023-04-11 DIAGNOSIS — R1111 Vomiting without nausea: Secondary | ICD-10-CM | POA: Diagnosis not present

## 2023-04-11 DIAGNOSIS — N400 Enlarged prostate without lower urinary tract symptoms: Secondary | ICD-10-CM | POA: Diagnosis not present

## 2023-04-11 DIAGNOSIS — R000 Tachycardia, unspecified: Secondary | ICD-10-CM | POA: Diagnosis not present

## 2023-04-11 DIAGNOSIS — R0902 Hypoxemia: Secondary | ICD-10-CM | POA: Diagnosis not present

## 2023-04-11 DIAGNOSIS — Z96652 Presence of left artificial knee joint: Secondary | ICD-10-CM | POA: Insufficient documentation

## 2023-04-11 DIAGNOSIS — R062 Wheezing: Secondary | ICD-10-CM | POA: Diagnosis not present

## 2023-04-11 DIAGNOSIS — R0602 Shortness of breath: Secondary | ICD-10-CM | POA: Diagnosis present

## 2023-04-11 DIAGNOSIS — Z86711 Personal history of pulmonary embolism: Secondary | ICD-10-CM | POA: Insufficient documentation

## 2023-04-11 DIAGNOSIS — I700 Atherosclerosis of aorta: Secondary | ICD-10-CM | POA: Diagnosis not present

## 2023-04-11 DIAGNOSIS — J09X2 Influenza due to identified novel influenza A virus with other respiratory manifestations: Secondary | ICD-10-CM

## 2023-04-11 LAB — CBC WITH DIFFERENTIAL/PLATELET
Abs Immature Granulocytes: 0.04 10*3/uL (ref 0.00–0.07)
Basophils Absolute: 0 10*3/uL (ref 0.0–0.1)
Basophils Relative: 1 %
Eosinophils Absolute: 0 10*3/uL (ref 0.0–0.5)
Eosinophils Relative: 0 %
HCT: 44.6 % (ref 39.0–52.0)
Hemoglobin: 14.1 g/dL (ref 13.0–17.0)
Immature Granulocytes: 1 %
Lymphocytes Relative: 7 %
Lymphs Abs: 0.4 10*3/uL — ABNORMAL LOW (ref 0.7–4.0)
MCH: 30.9 pg (ref 26.0–34.0)
MCHC: 31.6 g/dL (ref 30.0–36.0)
MCV: 97.6 fL (ref 80.0–100.0)
Monocytes Absolute: 0.5 10*3/uL (ref 0.1–1.0)
Monocytes Relative: 7 %
Neutro Abs: 5.7 10*3/uL (ref 1.7–7.7)
Neutrophils Relative %: 84 %
Platelets: UNDETERMINED 10*3/uL (ref 150–400)
RBC: 4.57 MIL/uL (ref 4.22–5.81)
RDW: 13.5 % (ref 11.5–15.5)
WBC: 6.7 10*3/uL (ref 4.0–10.5)
nRBC: 0 % (ref 0.0–0.2)

## 2023-04-11 LAB — COMPREHENSIVE METABOLIC PANEL
ALT: 20 U/L (ref 0–44)
AST: 59 U/L — ABNORMAL HIGH (ref 15–41)
Albumin: 3.4 g/dL — ABNORMAL LOW (ref 3.5–5.0)
Alkaline Phosphatase: 42 U/L (ref 38–126)
Anion gap: 14 (ref 5–15)
BUN: 30 mg/dL — ABNORMAL HIGH (ref 8–23)
CO2: 24 mmol/L (ref 22–32)
Calcium: 8.3 mg/dL — ABNORMAL LOW (ref 8.9–10.3)
Chloride: 100 mmol/L (ref 98–111)
Creatinine, Ser: 1.3 mg/dL — ABNORMAL HIGH (ref 0.61–1.24)
GFR, Estimated: 55 mL/min — ABNORMAL LOW (ref >60)
Glucose, Bld: 182 mg/dL — ABNORMAL HIGH (ref 70–99)
Potassium: 4.1 mmol/L (ref 3.5–5.1)
Sodium: 138 mmol/L (ref 135–145)
Total Bilirubin: 0.7 mg/dL (ref 0.0–1.2)
Total Protein: 7.9 g/dL (ref 6.5–8.1)

## 2023-04-11 LAB — LACTIC ACID, PLASMA: Lactic Acid, Venous: 5.1 mmol/L (ref 0.5–1.9)

## 2023-04-11 LAB — RESP PANEL BY RT-PCR (RSV, FLU A&B, COVID)  RVPGX2
Influenza A by PCR: POSITIVE — AB
Influenza B by PCR: NEGATIVE
Resp Syncytial Virus by PCR: NEGATIVE
SARS Coronavirus 2 by RT PCR: NEGATIVE

## 2023-04-11 MED ORDER — VANCOMYCIN HCL 2000 MG/400ML IV SOLN
2000.0000 mg | Freq: Once | INTRAVENOUS | Status: AC
  Administered 2023-04-11: 2000 mg via INTRAVENOUS
  Filled 2023-04-11: qty 400

## 2023-04-11 MED ORDER — IPRATROPIUM-ALBUTEROL 0.5-2.5 (3) MG/3ML IN SOLN
3.0000 mL | Freq: Once | RESPIRATORY_TRACT | Status: AC
  Administered 2023-04-11: 3 mL via RESPIRATORY_TRACT
  Filled 2023-04-11: qty 3

## 2023-04-11 MED ORDER — OSELTAMIVIR PHOSPHATE 75 MG PO CAPS
75.0000 mg | ORAL_CAPSULE | Freq: Once | ORAL | Status: AC
  Administered 2023-04-12: 75 mg via ORAL
  Filled 2023-04-11: qty 1

## 2023-04-11 MED ORDER — VANCOMYCIN HCL IN DEXTROSE 1-5 GM/200ML-% IV SOLN: 1000.0000 mg | Freq: Once | INTRAVENOUS | Status: DC

## 2023-04-11 MED ORDER — CEFEPIME HCL 2 G IV SOLR
2.0000 g | Freq: Once | INTRAVENOUS | Status: AC
  Administered 2023-04-11: 2 g via INTRAVENOUS
  Filled 2023-04-11: qty 12.5

## 2023-04-11 MED ORDER — SODIUM CHLORIDE 0.9 % IV BOLUS
1000.0000 mL | Freq: Once | INTRAVENOUS | Status: AC
  Administered 2023-04-11: 1000 mL via INTRAVENOUS

## 2023-04-11 NOTE — Progress Notes (Addendum)
 ED Pharmacy Antibiotic Sign Off An antibiotic consult was received from an ED provider for Cefepime + Vancomycin per pharmacy dosing for sepsis. A chart review was completed to assess appropriateness.   The following one time order(s) were placed:  Cefepime 2gm IV Vancomycin 2gm IV  Further antibiotic and/or antibiotic pharmacy consults should be ordered by the admitting provider if indicated.   Thank you for allowing pharmacy to be a part of this patient's care.   Junita Push, [facility]  Clinical Pharmacist 04/11/23 10:44 PM

## 2023-04-11 NOTE — ED Triage Notes (Signed)
 Patient arrived with complaints of a fall. Reporting NV earlier this week. Patient shob, diaphoretic, and tachycardic on EMS arrival.  Given 1000 LR, 125 solumedrol, albuterol/ atrovent prior to arrival

## 2023-04-11 NOTE — ED Provider Notes (Signed)
 [place] EMERGENCY DEPARTMENT AT [facility] Provider Note   CSN: [id number] Arrival date & time: 04/11/23  2154     History  Chief Complaint  Patient presents with   Shortness of Breath   Fall    [name] [name] is a 83 y.o. male.  The history is provided by the patient and medical records.  Shortness of Breath Associated symptoms: fever   Fall Associated symptoms include shortness of breath.   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  Initial EMS call out was for a fall where he slid out of a chair and landed on his butt.  There was no head injury no loss of consciousness.  Patient was found to be tachycardic and short of breath.  He reports he and his wife have both had some sort of "bug", suspected it was from hot dogs that they ate.  Both were having diarrhea and some intermittent vomiting earlier in the week, none today. He denies any known fever but has had chills throughout the week.  He did have liter of fluids, 125 of Solu-Medrol and albuterol/Atrovent en route.  No known covid/flu contacts.  Home Medications Prior to Admission medications   Medication Sig Start Date End Date Taking? Authorizing Provider  clobetasol ointment (TEMOVATE) 0.05 % Apply 1 application. topically 2 (two) times daily. 05/09/21   McDonald, Rachelle Hora, DPM  rivaroxaban (XARELTO) 20 MG TABS tablet Take 20 mg by mouth daily with supper. Patient not taking: Reported on 05/09/2021    [provider]      Allergies    Patient has no known allergies.    Review of Systems   Review of Systems  Constitutional:  Positive for chills and fever.  Respiratory:  Positive for shortness of breath.   All other systems reviewed and are negative.   Physical Exam Updated Vital Signs BP (!) 164/90   Pulse (!) 130   Temp 99.5 F (37.5 C) (Oral)   Resp (!) 34   SpO2 93%   Physical Exam Vitals and  nursing note reviewed.  Constitutional:      Appearance: He is well-developed.  HENT:     Head: Normocephalic and atraumatic.  Eyes:     Conjunctiva/sclera: Conjunctivae normal.     Pupils: Pupils are equal, round, and reactive to light.  Cardiovascular:     Rate and Rhythm: Regular rhythm. Tachycardia present.     Heart sounds: Normal heart sounds.     Comments: Tachycardia 130s on exam Pulmonary:     Effort: Pulmonary effort is normal.     Breath sounds: Normal breath sounds. No wheezing.     Comments: Some increased WOB, seems to have some intermixed wheezes/rhonchi on exam Abdominal:     General: Bowel sounds are normal.     Palpations: Abdomen is soft.  Musculoskeletal:        General: Normal range of motion.     Cervical back: Normal range of motion.  Skin:    General: Skin is warm and dry.  Neurological:     Mental Status: He is alert and oriented to person, place, and time.     ED Results / Procedures / Treatments   Labs (all labs ordered are listed, but only abnormal results are displayed) Labs Reviewed  RESP PANEL BY RT-PCR (RSV, FLU A&B, COVID)  RVPGX2 - Abnormal; Notable for the following components:  Result Value   Influenza A by PCR POSITIVE (*)    All other components within normal limits  CBC WITH DIFFERENTIAL/PLATELET - Abnormal; Notable for the following components:   Lymphs Abs 0.4 (*)    All other components within normal limits  COMPREHENSIVE METABOLIC PANEL - Abnormal; Notable for the following components:   Glucose, Bld 182 (*)    BUN 30 (*)    Creatinine, Ser 1.30 (*)    Calcium 8.3 (*)    Albumin 3.4 (*)    AST 59 (*)    GFR, Estimated 55 (*)    All other components within normal limits  LACTIC ACID, PLASMA - Abnormal; Notable for the following components:   Lactic Acid, Venous 5.1 (*)    All other components within normal limits  CULTURE, BLOOD (ROUTINE X 2)  CULTURE, BLOOD (ROUTINE X 2)  URINALYSIS, W/ REFLEX TO CULTURE (INFECTION  SUSPECTED)  LACTIC ACID, PLASMA    EKG EKG Interpretation Date/Time:  Wednesday April 11 2023 22:01:02 EDT Ventricular Rate:  128 PR Interval:  129 QRS Duration:  108 QT Interval:  316 QTC Calculation: 462 R Axis:   115  Text Interpretation: Sinus tachycardia Right axis deviation ST depression, probably rate related No significant change since last tracing Confirmed by Gwyneth Sprout ([id number]) on 04/11/2023 10:40:51 PM  Radiology DG Chest Port 1 View Result Date: 04/11/2023 CLINICAL DATA:  Tachycardia sepsis EXAM: PORTABLE CHEST 1 VIEW COMPARISON:  09/17/2014 FINDINGS: Minimal atelectasis at the lingula. No focal consolidation, pleural effusion or pneumothorax. Aortic atherosclerosis IMPRESSION: No active disease. Minimal lingular atelectasis. Electronically Signed   By: Jasmine Pang M.D.   On: 04/11/2023 23:54    Procedures Procedures    CRITICAL CARE Performed by: Garlon Hatchet   Total critical care time: 45 minutes  Critical care time was exclusive of separately billable procedures and treating other patients.  Critical care was necessary to treat or prevent imminent or life-threatening deterioration.  Critical care was time spent personally by me on the following activities: development of treatment plan with patient and/or surrogate as well as nursing, discussions with consultants, evaluation of patient's response to treatment, examination of patient, obtaining history from patient or surrogate, ordering and performing treatments and interventions, ordering and review of laboratory studies, ordering and review of radiographic studies, pulse oximetry and re-evaluation of patient's condition.   Medications Ordered in ED Medications  vancomycin (VANCOREADY) IVPB 2000 mg/400 mL (2,000 mg Intravenous New Bag/Given 04/11/23 2334)  oseltamivir (TAMIFLU) capsule 75 mg (has no administration in time range)  sodium chloride 0.9 % bolus 1,000 mL (0 mLs Intravenous Stopped 04/11/23  2336)  ipratropium-albuterol (DUONEB) 0.5-2.5 (3) MG/3ML nebulizer solution 3 mL (3 mLs Nebulization Given 04/11/23 2219)  ceFEPIme (MAXIPIME) 2 g in sodium chloride 0.9 % 100 mL IVPB (2 g Intravenous New Bag/Given 04/11/23 2333)    ED Course/ Medical Decision Making/ A&P                                 Medical Decision Making Amount and/or Complexity of Data Reviewed Labs: ordered. Radiology: ordered and independent interpretation performed. ECG/medicine tests: ordered and independent interpretation performed.  Risk Prescription drug management. Decision regarding hospitalization.   83 year old male presenting to the ED with multiple concerns.  EMS initial call out was for a fall where he slid out of the chair.  There was no head injury or loss of consciousness.  Admits  to some GI and URI symptoms over the past few days, wife sick with similar as well.  He is tachycardic, tachypneic, and wheezing on arrival.  Low-grade fever at 99.5.  Sepsis workup pursued.  Given additional IV fluids and DuoNeb here.  Lactate 5.1 but maintains normal white count.  He was given broad-spectrum antibiotics.  No significant electrolyte derangement.  RVP is positive for influenza A.  Chest x-ray is clear.  Patient has had some desaturations here on room air into the upper 80s.  He was placed on 2 L and is maintaining this well.  On repeat assessment he states he is feeling much better from time of arrival, requesting coffee which was given.  We discussed his results, will initiate Tamiflu.  He is agreeable to admission.  HR is improving with IVF.  Discussed with Dr. Janalyn Shy-- will admit for ongoing care.  Final Clinical Impression(s) / ED Diagnoses Final diagnoses:  Influenza A  Hypoxia    Rx / DC Orders ED Discharge Orders     None         Garlon Hatchet, PA-C 04/12/23 0132    Gwyneth Sprout, MD 04/15/23 [phone]

## 2023-04-12 ENCOUNTER — Inpatient Hospital Stay (HOSPITAL_COMMUNITY)

## 2023-04-12 ENCOUNTER — Other Ambulatory Visit: Payer: Self-pay

## 2023-04-12 ENCOUNTER — Encounter (HOSPITAL_COMMUNITY): Payer: Self-pay | Admitting: Internal Medicine

## 2023-04-12 DIAGNOSIS — A084 Viral intestinal infection, unspecified: Secondary | ICD-10-CM

## 2023-04-12 DIAGNOSIS — J441 Chronic obstructive pulmonary disease with (acute) exacerbation: Secondary | ICD-10-CM | POA: Diagnosis not present

## 2023-04-12 DIAGNOSIS — E785 Hyperlipidemia, unspecified: Secondary | ICD-10-CM | POA: Insufficient documentation

## 2023-04-12 DIAGNOSIS — W19XXXA Unspecified fall, initial encounter: Secondary | ICD-10-CM | POA: Diagnosis not present

## 2023-04-12 DIAGNOSIS — E872 Acidosis, unspecified: Secondary | ICD-10-CM | POA: Diagnosis not present

## 2023-04-12 DIAGNOSIS — M25552 Pain in left hip: Secondary | ICD-10-CM | POA: Diagnosis not present

## 2023-04-12 DIAGNOSIS — M799 Soft tissue disorder, unspecified: Secondary | ICD-10-CM

## 2023-04-12 DIAGNOSIS — B9789 Other viral agents as the cause of diseases classified elsewhere: Secondary | ICD-10-CM

## 2023-04-12 DIAGNOSIS — Z96652 Presence of left artificial knee joint: Secondary | ICD-10-CM | POA: Diagnosis not present

## 2023-04-12 DIAGNOSIS — A419 Sepsis, unspecified organism: Secondary | ICD-10-CM | POA: Diagnosis present

## 2023-04-12 DIAGNOSIS — Z043 Encounter for examination and observation following other accident: Secondary | ICD-10-CM | POA: Diagnosis not present

## 2023-04-12 DIAGNOSIS — Z86711 Personal history of pulmonary embolism: Secondary | ICD-10-CM | POA: Insufficient documentation

## 2023-04-12 DIAGNOSIS — J09X2 Influenza due to identified novel influenza A virus with other respiratory manifestations: Secondary | ICD-10-CM | POA: Diagnosis not present

## 2023-04-12 DIAGNOSIS — Y92009 Unspecified place in unspecified non-institutional (private) residence as the place of occurrence of the external cause: Secondary | ICD-10-CM

## 2023-04-12 DIAGNOSIS — A4189 Other specified sepsis: Secondary | ICD-10-CM | POA: Diagnosis not present

## 2023-04-12 LAB — CBC
HCT: 40.4 % (ref 39.0–52.0)
Hemoglobin: 12.7 g/dL — ABNORMAL LOW (ref 13.0–17.0)
MCH: 31.1 pg (ref 26.0–34.0)
MCHC: 31.4 g/dL (ref 30.0–36.0)
MCV: 98.8 fL (ref 80.0–100.0)
Platelets: 127 10*3/uL — ABNORMAL LOW (ref 150–400)
RBC: 4.09 MIL/uL — ABNORMAL LOW (ref 4.22–5.81)
RDW: 13.7 % (ref 11.5–15.5)
WBC: 6.4 10*3/uL (ref 4.0–10.5)
nRBC: 0 % (ref 0.0–0.2)

## 2023-04-12 LAB — COMPREHENSIVE METABOLIC PANEL
ALT: 21 U/L (ref 0–44)
AST: 61 U/L — ABNORMAL HIGH (ref 15–41)
Albumin: 3.1 g/dL — ABNORMAL LOW (ref 3.5–5.0)
Alkaline Phosphatase: 35 U/L — ABNORMAL LOW (ref 38–126)
Anion gap: 8 (ref 5–15)
BUN: 28 mg/dL — ABNORMAL HIGH (ref 8–23)
CO2: 25 mmol/L (ref 22–32)
Calcium: 8 mg/dL — ABNORMAL LOW (ref 8.9–10.3)
Chloride: 104 mmol/L (ref 98–111)
Creatinine, Ser: 1.06 mg/dL (ref 0.61–1.24)
GFR, Estimated: >60 mL/min (ref >60)
Glucose, Bld: 157 mg/dL — ABNORMAL HIGH (ref 70–99)
Potassium: 4.5 mmol/L (ref 3.5–5.1)
Sodium: 137 mmol/L (ref 135–145)
Total Bilirubin: 0.9 mg/dL (ref 0.0–1.2)
Total Protein: 6.9 g/dL (ref 6.5–8.1)

## 2023-04-12 LAB — URINALYSIS, W/ REFLEX TO CULTURE (INFECTION SUSPECTED)
Bilirubin Urine: NEGATIVE
Glucose, UA: NEGATIVE mg/dL
Ketones, ur: 5 mg/dL — AB
Leukocytes,Ua: NEGATIVE
Nitrite: NEGATIVE
Protein, ur: 100 mg/dL — AB
Specific Gravity, Urine: 1.028 (ref 1.005–1.030)
pH: 5 (ref 5.0–8.0)

## 2023-04-12 LAB — LACTIC ACID, PLASMA
Lactic Acid, Venous: 2 mmol/L (ref 0.5–1.9)
Lactic Acid, Venous: 1.4 mmol/L (ref 0.5–1.9)

## 2023-04-12 LAB — GLUCOSE, CAPILLARY
Glucose-Capillary: 139 mg/dL — ABNORMAL HIGH (ref 70–99)
Glucose-Capillary: 146 mg/dL — ABNORMAL HIGH (ref 70–99)

## 2023-04-12 LAB — CBG MONITORING, ED: Glucose-Capillary: 148 mg/dL — ABNORMAL HIGH (ref 70–99)

## 2023-04-12 MED ORDER — HYDRALAZINE HCL 20 MG/ML IJ SOLN: 5.0000 mg | Freq: Three times a day (TID) | INTRAMUSCULAR | Status: DC | PRN

## 2023-04-12 MED ORDER — OSELTAMIVIR PHOSPHATE 75 MG PO CAPS
75.0000 mg | ORAL_CAPSULE | Freq: Two times a day (BID) | ORAL | Status: DC
  Administered 2023-04-12 – 2023-04-13 (×2): 75 mg via ORAL
  Filled 2023-04-12 (×2): qty 1

## 2023-04-12 MED ORDER — GUAIFENESIN ER 600 MG PO TB12
600.0000 mg | ORAL_TABLET | Freq: Two times a day (BID) | ORAL | Status: DC
  Administered 2023-04-12 – 2023-04-13 (×4): 600 mg via ORAL
  Filled 2023-04-12 (×4): qty 1

## 2023-04-12 MED ORDER — ONDANSETRON HCL 4 MG/2ML IJ SOLN: 4.0000 mg | Freq: Four times a day (QID) | INTRAMUSCULAR | Status: DC | PRN

## 2023-04-12 MED ORDER — LACTATED RINGERS IV BOLUS
1000.0000 mL | INTRAVENOUS | Status: AC
  Administered 2023-04-12: 1000 mL via INTRAVENOUS

## 2023-04-12 MED ORDER — IPRATROPIUM-ALBUTEROL 0.5-2.5 (3) MG/3ML IN SOLN: 3.0000 mL | RESPIRATORY_TRACT | Status: DC | PRN

## 2023-04-12 MED ORDER — SODIUM CHLORIDE 0.9% FLUSH
3.0000 mL | Freq: Two times a day (BID) | INTRAVENOUS | Status: DC
  Administered 2023-04-12 – 2023-04-13 (×4): 3 mL via INTRAVENOUS

## 2023-04-12 MED ORDER — SODIUM CHLORIDE 0.9 % IV SOLN: 250.0000 mL | INTRAVENOUS | Status: AC | PRN

## 2023-04-12 MED ORDER — METHYLPREDNISOLONE SODIUM SUCC 40 MG IJ SOLR
40.0000 mg | Freq: Two times a day (BID) | INTRAMUSCULAR | Status: DC
  Administered 2023-04-12 – 2023-04-13 (×3): 40 mg via INTRAVENOUS
  Filled 2023-04-12 (×3): qty 1

## 2023-04-12 MED ORDER — IPRATROPIUM BROMIDE 0.02 % IN SOLN
0.5000 mg | Freq: Four times a day (QID) | RESPIRATORY_TRACT | Status: DC
  Administered 2023-04-12 (×4): 0.5 mg via RESPIRATORY_TRACT
  Filled 2023-04-12 (×4): qty 2.5

## 2023-04-12 MED ORDER — AZITHROMYCIN 250 MG PO TABS
500.0000 mg | ORAL_TABLET | Freq: Every day | ORAL | Status: DC
  Administered 2023-04-12: 500 mg via ORAL

## 2023-04-12 MED ORDER — ACETAMINOPHEN 325 MG PO TABS: 650.0000 mg | ORAL_TABLET | Freq: Four times a day (QID) | ORAL | Status: DC | PRN

## 2023-04-12 MED ORDER — SODIUM CHLORIDE 0.9 % IV SOLN
500.0000 mg | INTRAVENOUS | Status: AC
  Administered 2023-04-12: 500 mg via INTRAVENOUS
  Filled 2023-04-12: qty 5

## 2023-04-12 MED ORDER — LACTATED RINGERS IV SOLN: INTRAVENOUS | Status: AC

## 2023-04-12 MED ORDER — LEVALBUTEROL HCL 0.63 MG/3ML IN NEBU
0.6300 mg | INHALATION_SOLUTION | Freq: Four times a day (QID) | RESPIRATORY_TRACT | Status: DC
  Administered 2023-04-12 (×4): 0.63 mg via RESPIRATORY_TRACT
  Filled 2023-04-12 (×4): qty 3

## 2023-04-12 MED ORDER — ACETAMINOPHEN 650 MG RE SUPP: 650.0000 mg | Freq: Four times a day (QID) | RECTAL | Status: DC | PRN

## 2023-04-12 MED ORDER — ONDANSETRON HCL 4 MG PO TABS: 4.0000 mg | ORAL_TABLET | Freq: Four times a day (QID) | ORAL | Status: DC | PRN

## 2023-04-12 MED ORDER — ENOXAPARIN SODIUM 40 MG/0.4ML IJ SOSY
40.0000 mg | PREFILLED_SYRINGE | INTRAMUSCULAR | Status: DC
  Administered 2023-04-12 – 2023-04-13 (×2): 40 mg via SUBCUTANEOUS
  Filled 2023-04-12 (×2): qty 0.4

## 2023-04-12 MED ORDER — OSELTAMIVIR PHOSPHATE 30 MG PO CAPS
30.0000 mg | ORAL_CAPSULE | Freq: Two times a day (BID) | ORAL | Status: DC
  Administered 2023-04-12: 30 mg via ORAL
  Filled 2023-04-12 (×2): qty 1

## 2023-04-12 MED ORDER — SODIUM CHLORIDE 0.9% FLUSH: 3.0000 mL | INTRAVENOUS | Status: DC | PRN

## 2023-04-12 NOTE — Progress Notes (Signed)
 Brief same day note:  Patient is a 33 male with history of COPD, hyperlipidemia, PE, aortic aneurysm who presented with complaint of shortness of breath, also fell at home.  Also reported diarrhea.  Presentation, he was tachycardic with heart rate in the range of 130, tachypneic, saturating fine on room air.  Lab work showed elevated lactate of 5.1.  Respiratory viral panel positive for influenza A.  Lab work showed creatinine of 1.3.  Chest x-ray did not show any acute disease.  Patient was admitted for the management of influenza A with concurrent COPD exacerbation.  Patient seen and examined at bedside today.  During my evaluation, he looked overall comfortable.  On 2 L of oxygen per minute.  He denies any worsening shortness of breath or cough.  He was alert oriented  Assessment and plan:  Sepsis secondary to influenza A: Presented with nausea, vomiting, diarrhea, shortness of breath.  On presentation he was tachypneic, tachycardic with elevated lactate level, AKI.  Started on IV fluid.  Chest x-ray did not show any pneumonia.  Also started on azithromycin  Severe lactacidosis : Lactic acid of 5.1 on presentation.  Currently improving.  Continue gentle IV fluids  COPD exacerbation: Started on steroids.  Currently on room air.  Acute hypoxic respiratory failure: Secondary to influenza A, COPD exacerbation.  Continue to wean the oxygen.  Not on oxygen at home  Tobacco use: Smokes half pack a day.  Counseled cessation.  AKI: Resolved with IV fluid  History of PE: On Xarelto previously.  Not on anticoagulation right now  Fall at home: PT/OT consulted.  Knee x-ray/hip x-ray negative for acute findings  Corn of right foot: Podiatry consult requested

## 2023-04-12 NOTE — Progress Notes (Signed)
 PHARMACY NOTE:  ANTIMICROBIAL RENAL DOSAGE ADJUSTMENT  Current antimicrobial regimen includes a mismatch between antimicrobial dosage and estimated renal function.  As per policy approved by the Pharmacy & Therapeutics and Medical Executive Committees, the antimicrobial dosage will be adjusted accordingly.  Current antimicrobial dosage:  Tamilfu 75mg  po BID  Indication: +Influenza A  Renal Function:  Estimated Creatinine Clearance: 52.1 mL/min (A) (by C-G formula based on SCr of 1.3 mg/dL (H)). []      On intermittent HD, scheduled: []      On CRRT    Antimicrobial dosage has been changed to:  Tamiflu 75mg  po x1 then 30mg  po BID x 5days  Additional comments:   Thank you for allowing pharmacy to be a part of this patient's care.  Junita Push, [facility] 04/12/2023 12:36 AM

## 2023-04-12 NOTE — ED Notes (Signed)
 Patient transported to X-ray

## 2023-04-12 NOTE — ED Notes (Signed)
 ED TO INPATIENT HANDOFF REPORT  Name/Age/Gender [name] [name] 83 y.o. male  Code Status    Code Status Orders  (From admission, onward)           Start     Ordered   04/12/23 0020  Full code  Continuous       Question:  By:  Answer:  Consent: discussion documented in EHR   04/12/23 0021           Code Status History     Date Active Date Inactive Code Status Order ID Comments User Context   11/17/2014 1311 11/19/2014 1642 Full Code [id number]  Samson Frederic, MD Inpatient   02/28/2014 1539 03/03/2014 1902 Full Code [id number]  Conley Canal Inpatient   01/22/2014 1941 01/26/2014 1918 Full Code [id number]  Cathren Harsh, MD Inpatient       Home/SNF/Other Home  Chief Complaint Sepsis [facility]) [A41.9]  Level of Care/Admitting Diagnosis ED Disposition     ED Disposition  Admit   Condition  --   Comment  Hospital Area: [facility] [[id number]]  Level of Care: Telemetry [5]  Admit to tele based on following criteria: Other see comments  Comments: Monitor for arrhythmia  May admit patient to [facility] or [facility] if equivalent level of care is available:: No  Covid Evaluation: Confirmed COVID Negative  Diagnosis: Sepsis [facility]) [[id number]]  Admitting Physician: Tereasa Coop [[id number]]  Attending Physician: Tereasa Coop [[id number]]  Certification:: I certify this patient will need inpatient services for at least 2 midnights  Expected Medical Readiness: 04/16/2023          Medical History Past Medical History:  Diagnosis Date   BPH (benign prostatic hypertrophy)    Difficulty sleeping    Frequency of urination    History of DVT (deep vein thrombosis) JAN 2016   Lumbar disc disease    Nocturia    Osteoarthritis    PE (pulmonary embolism) JAN 2016    Allergies No Known Allergies  IV Location/Drains/Wounds Patient Lines/Drains/Airways Status     Active Line/Drains/Airways     Name Placement date Placement time Site Days    Peripheral IV 04/11/23 18 G Left Antecubital 04/11/23  2205  Antecubital  1   Peripheral IV 04/11/23 18 G Right Antecubital 04/11/23  2211  Antecubital  1   Incision (Closed) 11/17/14 Knee 11/17/14  0919  -- 3068   Incision (Closed) 11/17/14 Knee Left 11/17/14  1054  -- 3068            Labs/Imaging Results for orders placed or performed during the hospital encounter of 04/11/23 (from the past 48 hours)  CBC with Differential     Status: Abnormal   Collection Time: 04/11/23 10:08 PM  Result Value Ref Range   WBC 6.7 4.0 - 10.5 K/uL   RBC 4.57 4.22 - 5.81 MIL/uL   Hemoglobin 14.1 13.0 - 17.0 g/dL   HCT 01.0 27.2 - 53.6 %   MCV 97.6 80.0 - 100.0 fL   MCH 30.9 26.0 - 34.0 pg   MCHC 31.6 30.0 - 36.0 g/dL   RDW 64.4 03.4 - 74.2 %   Platelets PLATELET CLUMPS NOTED ON SMEAR, UNABLE TO ESTIMATE 150 - 400 K/uL   nRBC 0.0 0.0 - 0.2 %   Neutrophils Relative % 84 %   Neutro Abs 5.7 1.7 - 7.7 K/uL   Lymphocytes Relative 7 %   Lymphs Abs 0.4 (L) 0.7 - 4.0 K/uL   Monocytes Relative  7 %   Monocytes Absolute 0.5 0.1 - 1.0 K/uL   Eosinophils Relative 0 %   Eosinophils Absolute 0.0 0.0 - 0.5 K/uL   Basophils Relative 1 %   Basophils Absolute 0.0 0.0 - 0.1 K/uL   Immature Granulocytes 1 %   Abs Immature Granulocytes 0.04 0.00 - 0.07 K/uL    Comment: Performed at [facility], [address], [place], [place] [address]  Comprehensive metabolic panel     Status: Abnormal   Collection Time: 04/11/23 10:08 PM  Result Value Ref Range   Sodium 138 135 - 145 mmol/L   Potassium 4.1 3.5 - 5.1 mmol/L   Chloride 100 98 - 111 mmol/L   CO2 24 22 - 32 mmol/L   Glucose, Bld 182 (H) 70 - 99 mg/dL    Comment: Glucose reference range applies only to samples taken after fasting for at least 8 hours.   BUN 30 (H) 8 - 23 mg/dL   Creatinine, Ser 1.30 (H) 0.61 - 1.24 mg/dL   Calcium 8.3 (L) 8.9 - 10.3 mg/dL   Total Protein 7.9 6.5 - 8.1 g/dL   Albumin 3.4 (L) 3.5 - 5.0 g/dL   AST 59  (H) 15 - 41 U/L   ALT 20 0 - 44 U/L   Alkaline Phosphatase 42 38 - 126 U/L   Total Bilirubin 0.7 0.0 - 1.2 mg/dL   GFR, Estimated 55 (L) >60 mL/min    Comment: (NOTE) Calculated using the CKD-EPI Creatinine Equation (2021)    Anion gap 14 5 - 15    Comment: Performed at [facility], [address], [place], [place] [address]  Blood culture (routine x 2)     Status: None (Preliminary result)   Collection Time: 04/11/23 10:08 PM   Specimen: BLOOD  Result Value Ref Range   Specimen Description      BLOOD RIGHT ANTECUBITAL Performed at [facility], [address] [place], [place] [address]    Special Requests      BOTTLES DRAWN AEROBIC AND ANAEROBIC Blood Culture adequate volume Performed at [facility], [address], [place], [place] [address]    Culture      NO GROWTH < 12 HOURS Performed at [facility], [address] [place], [place] [address]    Report Status PENDING   Blood culture (routine x 2)     Status: None (Preliminary result)   Collection Time: 04/11/23 10:08 PM   Specimen: BLOOD  Result Value Ref Range   Specimen Description      BLOOD BLOOD LEFT HAND Performed at [facility], [address] [place], [place] [address]    Special Requests      BOTTLES DRAWN AEROBIC AND ANAEROBIC Blood Culture results may not be optimal due to an inadequate volume of blood received in culture bottles Performed at [facility], [address], [place], [place] [address]    Culture      NO GROWTH < 12 HOURS Performed at [facility], [address] [place], [place] [address]    Report Status PENDING   Resp panel by RT-PCR (RSV, Flu A&B, Covid) Anterior Nasal Swab     Status: Abnormal   Collection Time: 04/11/23 10:08 PM   Specimen: Anterior Nasal Swab  Result Value Ref Range   SARS Coronavirus 2 by RT PCR NEGATIVE NEGATIVE    Comment: (NOTE) SARS-CoV-2 target  nucleic acids are NOT  DETECTED.  The SARS-CoV-2 RNA is generally detectable in upper respiratory specimens during the acute phase of infection. The lowest concentration of SARS-CoV-2 viral copies this assay can detect is 138 copies/mL. A negative result does not preclude SARS-Cov-2 infection and should not be used as the sole basis for treatment or other patient management decisions. A negative result may occur with  improper specimen collection/handling, submission of specimen other than nasopharyngeal swab, presence of viral mutation(s) within the areas targeted by this assay, and inadequate number of viral copies(<138 copies/mL). A negative result must be combined with clinical observations, patient history, and epidemiological information. The expected result is Negative.  Fact Sheet for Patients:  BloggerCourse.com  Fact Sheet for Healthcare Providers:  SeriousBroker.it  This test is no t yet approved or cleared by the [place] FDA and  has been authorized for detection and/or diagnosis of SARS-CoV-2 by FDA under an Emergency Use Authorization (EUA). This EUA will remain  in effect (meaning this test can be used) for the duration of the COVID-19 declaration under Section 564(b)(1) of the Act, 21 U.S.C.section 360bbb-3(b)(1), unless the authorization is terminated  or revoked sooner.       Influenza A by PCR POSITIVE (A) NEGATIVE   Influenza B by PCR NEGATIVE NEGATIVE    Comment: (NOTE) The Xpert Xpress SARS-CoV-2/FLU/RSV plus assay is intended as an aid in the diagnosis of influenza from Nasopharyngeal swab specimens and should not be used as a sole basis for treatment. Nasal washings and aspirates are unacceptable for Xpert Xpress SARS-CoV-2/FLU/RSV testing.  Fact Sheet for Patients: BloggerCourse.com  Fact Sheet for Healthcare Providers: SeriousBroker.it  This  test is not yet approved or cleared by the [place] FDA and has been authorized for detection and/or diagnosis of SARS-CoV-2 by FDA under an Emergency Use Authorization (EUA). This EUA will remain in effect (meaning this test can be used) for the duration of the COVID-19 declaration under Section 564(b)(1) of the Act, 21 U.S.C. section 360bbb-3(b)(1), unless the authorization is terminated or revoked.     Resp Syncytial Virus by PCR NEGATIVE NEGATIVE    Comment: (NOTE) Fact Sheet for Patients: BloggerCourse.com  Fact Sheet for Healthcare Providers: SeriousBroker.it  This test is not yet approved or cleared by the [place] FDA and has been authorized for detection and/or diagnosis of SARS-CoV-2 by FDA under an Emergency Use Authorization (EUA). This EUA will remain in effect (meaning this test can be used) for the duration of the COVID-19 declaration under Section 564(b)(1) of the Act, 21 U.S.C. section 360bbb-3(b)(1), unless the authorization is terminated or revoked.  Performed at [facility], [address], [place], [place] [address]   Lactic acid, plasma     Status: Abnormal   Collection Time: 04/11/23 10:08 PM  Result Value Ref Range   Lactic Acid, Venous 5.1 (HH) 0.5 - 1.9 mmol/L    Comment: CRITICAL RESULT CALLED TO, READ BACK BY AND VERIFIED WITH ROHR, A RN@ 2238 ON 04/11/2023 BY MTA Performed at [facility] [address], [place], [place] [address]   Urinalysis, w/ Reflex to Culture (Infection Suspected) -Urine, Clean Catch     Status: Abnormal   Collection Time: 04/11/23 11:55 PM  Result Value Ref Range   Specimen Source URINE, CLEAN CATCH    Color, Urine AMBER (A) YELLOW    Comment: BIOCHEMICALS MAY BE AFFECTED BY COLOR   APPearance HAZY (A) CLEAR   Specific Gravity, Urine 1.028 1.005 - 1.030   pH 5.0  5.0 - 8.0   Glucose, UA NEGATIVE NEGATIVE mg/dL   Hgb urine  dipstick MODERATE (A) NEGATIVE   Bilirubin Urine NEGATIVE NEGATIVE   Ketones, ur 5 (A) NEGATIVE mg/dL   Protein, ur 829 (A) NEGATIVE mg/dL   Nitrite NEGATIVE NEGATIVE   Leukocytes,Ua NEGATIVE NEGATIVE   RBC / HPF 0-5 0 - 5 RBC/hpf   WBC, UA 0-5 0 - 5 WBC/hpf    Comment:        Reflex urine culture not performed if WBC <=10, OR if Squamous epithelial cells >5. If Squamous epithelial cells >5 suggest recollection.    Bacteria, UA RARE (A) NONE SEEN   Squamous Epithelial / HPF 0-5 0 - 5 /HPF   Mucus PRESENT    Hyaline Casts, UA PRESENT     Comment: Performed at [facility], [address] [place], [place] [address]  Lactic acid, plasma     Status: Abnormal   Collection Time: 04/12/23  2:37 AM  Result Value Ref Range   Lactic Acid, Venous 2.0 (HH) 0.5 - 1.9 mmol/L    Comment: CRITICAL VALUE NOTED. VALUE IS CONSISTENT WITH PREVIOUSLY REPORTED/CALLED VALUE Performed at [facility], [address] [place], [place] [address]   CBG monitoring, ED     Status: Abnormal   Collection Time: 04/12/23  2:56 AM  Result Value Ref Range   Glucose-Capillary 148 (H) 70 - 99 mg/dL    Comment: Glucose reference range applies only to samples taken after fasting for at least 8 hours.  Comprehensive metabolic panel     Status: Abnormal   Collection Time: 04/12/23  4:38 AM  Result Value Ref Range   Sodium 137 135 - 145 mmol/L   Potassium 4.5 3.5 - 5.1 mmol/L   Chloride 104 98 - 111 mmol/L   CO2 25 22 - 32 mmol/L   Glucose, Bld 157 (H) 70 - 99 mg/dL    Comment: Glucose reference range applies only to samples taken after fasting for at least 8 hours.   BUN 28 (H) 8 - 23 mg/dL   Creatinine, Ser 8.46 0.61 - 1.24 mg/dL   Calcium 8.0 (L) 8.9 - 10.3 mg/dL   Total Protein 6.9 6.5 - 8.1 g/dL   Albumin 3.1 (L) 3.5 - 5.0 g/dL   AST 61 (H) 15 - 41 U/L   ALT 21 0 - 44 U/L   Alkaline Phosphatase 35 (L) 38 - 126 U/L   Total Bilirubin 0.9 0.0 - 1.2 mg/dL   GFR,  Estimated >96 >29 mL/min    Comment: (NOTE) Calculated using the CKD-EPI Creatinine Equation (2021)    Anion gap 8 5 - 15    Comment: Performed at [facility], [address] [place], [place] [address]  CBC     Status: Abnormal   Collection Time: 04/12/23  4:38 AM  Result Value Ref Range   WBC 6.4 4.0 - 10.5 K/uL   RBC 4.09 (L) 4.22 - 5.81 MIL/uL   Hemoglobin 12.7 (L) 13.0 - 17.0 g/dL   HCT 32.4 40.1 - 02.7 %   MCV 98.8 80.0 - 100.0 fL   MCH 31.1 26.0 - 34.0 pg   MCHC 31.4 30.0 - 36.0 g/dL   RDW 25.3 66.4 - 40.3 %   Platelets 127 (L) 150 - 400 K/uL   nRBC 0.0 0.0 - 0.2 %    Comment: Performed at [facility], [address], [place], [place] [address]  Lactic acid, plasma  Status: None   Collection Time: 04/12/23  4:38 AM  Result Value Ref Range   Lactic Acid, Venous 1.4 0.5 - 1.9 mmol/L    Comment: Performed at [facility], [address] [place], [place] [address]   DG Knee Left Port Result Date: 04/12/2023 CLINICAL DATA:  [id number] Fall [id number] EXAM: PORTABLE LEFT KNEE - 1-2 VIEW COMPARISON:  None Available. FINDINGS: Total left knee arthroplasty. No surgical hardware complication. No evidence of fracture, dislocation, or joint effusion. No evidence of arthropathy or other focal bone abnormality. Soft tissues are unremarkable. Vascular calcifications. IMPRESSION: Negative for acute traumatic injury. Electronically Signed   By: Tish Frederickson M.D.   On: 04/12/2023 02:08   DG HIP PORT UNILAT WITH PELVIS 1V LEFT Result Date: 04/12/2023 CLINICAL DATA:  [id number] Fall [id number] No recent injury, previous surgery, ongoing hip pain "for a long time" EXAM: DG HIP (WITH OR WITHOUT PELVIS) 1V PORT LEFT COMPARISON:  None Available. FINDINGS: Intramedullary nail fixation of left femur partially visualized. There is no evidence of hip fracture or dislocation. No acute displaced fracture or diastasis of the bones of the pelvis. There is no  evidence of arthropathy or other focal bone abnormality. Vascular calcification. IMPRESSION: Negative for acute traumatic injury. Electronically Signed   By: Tish Frederickson M.D.   On: 04/12/2023 02:07   DG Chest Port 1 View Result Date: 04/11/2023 CLINICAL DATA:  Tachycardia sepsis EXAM: PORTABLE CHEST 1 VIEW COMPARISON:  09/17/2014 FINDINGS: Minimal atelectasis at the lingula. No focal consolidation, pleural effusion or pneumothorax. Aortic atherosclerosis IMPRESSION: No active disease. Minimal lingular atelectasis. Electronically Signed   By: Jasmine Pang M.D.   On: 04/11/2023 23:54    Pending Labs Unresulted Labs (From admission, onward)    None       Vitals/Pain Today's Vitals   04/12/23 1209 04/12/23 1245 04/12/23 1315 04/12/23 1319  BP:      Pulse: 91 91 100   Resp: 20 (!) 21 16   Temp:      TempSrc:      SpO2: 93% 92% 91%   Weight:      Height:      PainSc:    0-No pain    Isolation Precautions Droplet precaution  Medications Medications  azithromycin (ZITHROMAX) 500 mg in sodium chloride 0.9 % 250 mL IVPB (0 mg Intravenous Stopped 04/12/23 0230)    Followed by  azithromycin (ZITHROMAX) tablet 500 mg (has no administration in time range)  enoxaparin (LOVENOX) injection 40 mg (40 mg Subcutaneous Given 04/12/23 0949)  methylPREDNISolone sodium succinate (SOLU-MEDROL) 40 mg/mL injection 40 mg (40 mg Intravenous Given 04/12/23 0634)  oseltamivir (TAMIFLU) capsule 30 mg (30 mg Oral Given 04/12/23 0949)  levalbuterol (XOPENEX) nebulizer solution 0.63 mg (0.63 mg Nebulization Given 04/12/23 0820)  ipratropium-albuterol (DUONEB) 0.5-2.5 (3) MG/3ML nebulizer solution 3 mL (has no administration in time range)  ipratropium (ATROVENT) nebulizer solution 0.5 mg (0.5 mg Nebulization Given 04/12/23 0824)  sodium chloride flush (NS) 0.9 % injection 3 mL (3 mLs Intravenous Given 04/12/23 0949)  sodium chloride flush (NS) 0.9 % injection 3 mL (has no administration in time range)  0.9 %   sodium chloride infusion (has no administration in time range)  lactated ringers infusion (0 mLs Intravenous Stopped 04/12/23 1159)  acetaminophen (TYLENOL) tablet 650 mg (has no administration in time range)    Or  acetaminophen (TYLENOL) suppository 650 mg (has no administration in time range)  ondansetron (ZOFRAN) tablet 4 mg (has no administration in time  range)    Or  ondansetron (ZOFRAN) injection 4 mg (has no administration in time range)  guaiFENesin (MUCINEX) 12 hr tablet 600 mg (600 mg Oral Given 04/12/23 0949)  hydrALAZINE (APRESOLINE) injection 5 mg (has no administration in time range)  sodium chloride 0.9 % bolus 1,000 mL (0 mLs Intravenous Stopped 04/11/23 2336)  ipratropium-albuterol (DUONEB) 0.5-2.5 (3) MG/3ML nebulizer solution 3 mL (3 mLs Nebulization Given 04/11/23 2219)  ceFEPIme (MAXIPIME) 2 g in sodium chloride 0.9 % 100 mL IVPB (0 g Intravenous Stopped 04/12/23 0003)  vancomycin (VANCOREADY) IVPB 2000 mg/400 mL (0 mg Intravenous Stopped 04/12/23 0134)  oseltamivir (TAMIFLU) capsule 75 mg (75 mg Oral Given 04/12/23 0128)  lactated ringers bolus 1,000 mL (0 mLs Intravenous Stopped 04/12/23 0239)    Mobility walks

## 2023-04-12 NOTE — ED Notes (Signed)
 Hospitalist made aware of the patients normal Lactic level - see order modifications.

## 2023-04-12 NOTE — Sepsis Progress Note (Addendum)
 Elink monitoring for the code sepsis protocol.  Initial labs & antibiotics given prior to code sepsis order.

## 2023-04-12 NOTE — ED Notes (Signed)
Report given Richrd Humbles, RN

## 2023-04-12 NOTE — Plan of Care (Signed)

## 2023-04-12 NOTE — Sepsis Progress Note (Signed)
 Notified provider of need to order fluid bolus.  ?

## 2023-04-12 NOTE — ED Notes (Signed)
 Took patient off of oxygen

## 2023-04-12 NOTE — Progress Notes (Signed)
 PHARMACY NOTE:  ANTIMICROBIAL RENAL DOSAGE ADJUSTMENT  Current antimicrobial regimen includes a mismatch between antimicrobial dosage and estimated renal function.  As per policy approved by the Pharmacy & Therapeutics and Medical Executive Committees, the antimicrobial dosage will be adjusted accordingly.  Current antimicrobial dosage: Tamiflu 30mg  BID  Indication: Flu(+)  Renal Function:  Estimated Creatinine Clearance: 63.9 mL/min (by C-G formula based on SCr of 1.06 mg/dL). []      On intermittent HD, scheduled: []      On CRRT    Antimicrobial dosage has been changed to: Tamiflu 75mg  BID  Additional comments: N/A   Thank you for allowing pharmacy to be a part of this patient's care.  Cherylin Mylar, PharmD Clinical Pharmacist  3/13/20255:44 PM

## 2023-04-12 NOTE — Care Management CC44 (Signed)
 Condition Code 44 Documentation Completed  Patient Details  Name: [name] [name] MRN: [id number] Date of Birth: 1940-12-05   Condition Code 44 given:  Yes Patient signature on Condition Code 44 notice:  Yes Documentation of 2 MD's agreement:  Yes Code 44 added to claim:  Yes    Otelia Santee, LCSW 04/12/2023, 4:29 PM

## 2023-04-12 NOTE — H&P (Addendum)
 History and Physical    [name] [name]:[id number] DOB: [birth year] DOA: 04/11/2023  PCP: Darrow Bussing, MD   Patient coming from: Home   Chief Complaint:  Chief Complaint  Patient presents with   Shortness of Breath   Fall   ED TRIAGE note:  Patient arrived with complaints of a fall. Reporting NV earlier this week. Patient shob, diaphoretic, and tachycardic on EMS arrival.  Given 1000 LR, 125 solumedrol, albuterol/ atrovent prior to arrival      HPI:  [name] [name] is a 83 y.o. male with medical history significant of COPD, hyperlipidemia, history of PE, AAA and prediabetic presented to emergency department complaining of mechanical fall and shortness of breath. Initially EMS called out as patient had a fall he slid out of the chair and landed on his butt.  Denies any head injury loss of consciousness.  Patient found to be tachycardic and shortness of breath.  He reports he and his wife have some sort of bug suspected it was from hot dogs the aide.  Complaining about diarrhea and intermittent vomiting for 1 week except today.  Denies any fever and chill. And route to ED patient has been treated with Solu-Medrol 125 mg, 1 L of LR bolus and albuterol nebulizer.   ED Course:  At presentation to ED patient is tachycardic heart rate 130, tachypneic 34, elevated blood pressure 164/90 noted to 93% room air. EKG showing sinus tachycardia heart rate 128. Elevated lactic acid 5.1. Respiratory panel positive with influenza A. Blood cultures are in process. CMP showing elevated creatinine 1.3, low albumin 3.4 otherwise unremarkable. CBC unremarkable. Chest x-ray no active disease process.  Minimal lingual atelectasis.  In the ED patient has been received 1 L of NS bolus, vancomycin, cefepime, Tamiflu and DuoNeb nebulizer.  Hospitalist has been consulted for further evaluation management of sepsis in the setting of influenza A infection and COPD exacerbation.    Significant labs in  the ED: Lab Orders         Blood culture (routine x 2)         Resp panel by RT-PCR (RSV, Flu A&B, Covid) Anterior Nasal Swab         CBC with Differential         Comprehensive metabolic panel         Urinalysis, w/ Reflex to Culture (Infection Suspected) -Urine, Clean Catch         Comprehensive metabolic panel         CBC         Lactic acid, plasma       Review of Systems:  Review of Systems  Constitutional:  Positive for malaise/fatigue. Negative for chills, fever and weight loss.  Respiratory:  Positive for shortness of breath and wheezing. Negative for cough, hemoptysis and sputum production.   Cardiovascular:  Negative for chest pain and orthopnea.  Gastrointestinal:  Negative for abdominal pain, heartburn, nausea and vomiting.  Genitourinary:  Negative for dysuria, flank pain, frequency, hematuria and urgency.  Musculoskeletal:  Positive for falls. Negative for back pain, joint pain, myalgias and neck pain.  Neurological:  Negative for dizziness and headaches.  Psychiatric/Behavioral:  The patient is not nervous/anxious.     Past Medical History:  Diagnosis Date   BPH (benign prostatic hypertrophy)    Difficulty sleeping    Frequency of urination    History of DVT (deep vein thrombosis) JAN 2016   Lumbar disc disease    Nocturia    Osteoarthritis  PE (pulmonary embolism) JAN 2016    Past Surgical History:  Procedure Laterality Date   INTRAMEDULLARY (IM) NAIL INTERTROCHANTERIC Left 01/22/2014   Procedure: INTRAMEDULLARY (IM) NAIL INTERTROCHANTRIC;  Surgeon: Venita Lick, MD;  Location: [facility] OR;  Service: Orthopedics;  Laterality: Left;   IVC FILTER PLACEMENT ([facility] HX)  11/16/14   KNEE ARTHROPLASTY Left 11/17/2014   Procedure: Total knee Arthroplasty;  Surgeon: Samson Frederic, MD;  Location: [facility];  Service: Orthopedics;  Laterality: Left;   LUMBAR LAMINECTOMY       reports that he has been smoking cigarettes. He has never used smokeless tobacco. He reports  that he does not drink alcohol and does not use drugs.  No Known Allergies  History reviewed. No pertinent family history.  Prior to Admission medications   Medication Sig Start Date End Date Taking? Authorizing Provider  clobetasol ointment (TEMOVATE) 0.05 % Apply 1 application. topically 2 (two) times daily. 05/09/21   McDonald, Rachelle Hora, DPM  rivaroxaban (XARELTO) 20 MG TABS tablet Take 20 mg by mouth daily with supper. Patient not taking: Reported on 05/09/2021    [provider]     Physical Exam: Vitals:   04/11/23 2216 04/11/23 2304 04/11/23 2315 04/12/23 0000  BP: (!) 142/66  (!) 152/85 (!) 149/72  Pulse: (!) 129  (!) 126 (!) 108  Resp: (!) 31  (!) 31 (!) 23  Temp:      TempSrc:      SpO2: 95%  97% 96%  Weight:  97.5 kg    Height:  6' (1.829 m)      Physical Exam Constitutional:      Appearance: He is not ill-appearing.  Eyes:     Pupils: Pupils are equal, round, and reactive to light.  Cardiovascular:     Rate and Rhythm: Regular rhythm. Tachycardia present.  Pulmonary:     Effort: Pulmonary effort is normal.     Breath sounds: Normal breath sounds. No decreased breath sounds, wheezing, rhonchi or rales.  Musculoskeletal:        General: Normal range of motion.     Comments: Left-sided foot plantar aspect hyperkeratotic lesion with minimal bleeding.  Skin:    General: Skin is warm.     Capillary Refill: Capillary refill takes less than 2 seconds.  Neurological:     Mental Status: He is alert and oriented to person, place, and time.  Psychiatric:        Mood and Affect: Mood normal. Mood is not anxious.      Labs on Admission: I have personally reviewed following labs and imaging studies  CBC: Recent Labs  Lab 04/11/23 2208  WBC 6.7  NEUTROABS 5.7  HGB 14.1  HCT 44.6  MCV 97.6  PLT PLATELET CLUMPS NOTED ON SMEAR, UNABLE TO ESTIMATE   Basic Metabolic Panel: Recent Labs  Lab 04/11/23 2208  NA 138  K 4.1  CL 100  CO2 24  GLUCOSE 182*   BUN 30*  CREATININE 1.30*  CALCIUM 8.3*   GFR: Estimated Creatinine Clearance: 52.1 mL/min (A) (by C-G formula based on SCr of 1.3 mg/dL (H)). Liver Function Tests: Recent Labs  Lab 04/11/23 2208  AST 59*  ALT 20  ALKPHOS 42  BILITOT 0.7  PROT 7.9  ALBUMIN 3.4*   No results for input(s): "LIPASE", "AMYLASE" in the last 168 hours. No results for input(s): "AMMONIA" in the last 168 hours. Coagulation Profile: No results for input(s): "INR", "PROTIME" in the last 168 hours. Cardiac Enzymes: No results  for input(s): "CKTOTAL", "CKMB", "CKMBINDEX", "TROPONINI", "TROPONINIHS" in the last 168 hours. BNP (last 3 results) No results for input(s): "BNP" in the last 8760 hours. HbA1C: No results for input(s): "HGBA1C" in the last 72 hours. CBG: No results for input(s): "GLUCAP" in the last 168 hours. Lipid Profile: No results for input(s): "CHOL", "HDL", "LDLCALC", "TRIG", "CHOLHDL", "LDLDIRECT" in the last 72 hours. Thyroid Function Tests: No results for input(s): "TSH", "T4TOTAL", "FREET4", "T3FREE", "THYROIDAB" in the last 72 hours. Anemia Panel: No results for input(s): "VITAMINB12", "FOLATE", "FERRITIN", "TIBC", "IRON", "RETICCTPCT" in the last 72 hours. Urine analysis:    Component Value Date/Time   COLORURINE AMBER (A) 04/11/2023 2355   APPEARANCEUR HAZY (A) 04/11/2023 2355   LABSPEC 1.028 04/11/2023 2355   PHURINE 5.0 04/11/2023 2355   GLUCOSEU NEGATIVE 04/11/2023 2355   HGBUR MODERATE (A) 04/11/2023 2355   BILIRUBINUR NEGATIVE 04/11/2023 2355   KETONESUR 5 (A) 04/11/2023 2355   PROTEINUR 100 (A) 04/11/2023 2355   UROBILINOGEN 1.0 11/09/2014 0951   NITRITE NEGATIVE 04/11/2023 2355   LEUKOCYTESUR NEGATIVE 04/11/2023 2355    Radiological Exams on Admission: I have personally reviewed images DG Chest Port 1 View Result Date: 04/11/2023 CLINICAL DATA:  Tachycardia sepsis EXAM: PORTABLE CHEST 1 VIEW COMPARISON:  09/17/2014 FINDINGS: Minimal atelectasis at the lingula.  No focal consolidation, pleural effusion or pneumothorax. Aortic atherosclerosis IMPRESSION: No active disease. Minimal lingular atelectasis. Electronically Signed   By: Jasmine Pang M.D.   On: 04/11/2023 23:54     EKG: My personal interpretation of EKG shows: Sinus tachycardia heart rate 124    Assessment/Plan: Principal Problem:   Sepsis, viral (HCC) Active Problems:   COPD with acute exacerbation (HCC)   Infection due to novel influenza A virus   Benign prostatic hyperplasia   HLD (hyperlipidemia)   Hx of pulmonary embolus   Fall at home, initial encounter   Hyperkeratotic/corn lesion of the right foot    Assessment and Plan: Sepsis secondary to influenza A infection -Patient presenting to the emergency department multiple complaints include nausea, vomiting and diarrhea which has been ongoing for last few days has been resolved now however currently patient is experiencing shortness of breath and had an mechanical fall at home.  Patient denies any loss of consciousness or head injury.  And route to ED patient has been treated with IV Solu-Medrol and DuoNeb nebulizer treatment for COPD exacerbation. -At presentation to ED patient is tachycardic, tachypneic however blood pressure within good range.  Afebrile.  CBC no evidence of leukocytosis.  Lactic acid 5.1.  CMP showed evidence of AKI.  Respiratory panel positive with influenza A. -In route to ED patient is in 1 L of LR and in the ED patient received 1 L of NS bolus.  Also deceived IV ceftriaxone and cefepime while in the ED. -Blood cultures are in process.  Chest x-ray no evidence of pneumonia - Patient meets sepsis criteria in the setting of influenza A infection. -Continue to treat for COPD exacerbation and with Tamiflu. -Activated code sepsis protocol.  Received 2 L of fluid so far.  Rechecking lactic acid level.  Continue maintenance fluid LR 25 cc/h - Holding any IV antibiotic at this moment given sepsis in the setting of  influenza A infection.  Will follow-up with blood cultures result. - Continue supportive care.   COPD exacerbation -In route to ED patient has been treated with IV Solu-Medrol 125 mg and DuoNeb nebulizer. - Continue IV Solu-Medrol twice daily, IV azithromycin followed by transition to  oral azithromycin, DuoNeb as needed, Xopenex scheduled, Atrovent scheduled, supportive care and Mucinex. -Continue check pulse ox and supplemental oxygen as needed to keep O2 sat above 92%.  Mechanical fall at home - Patient reported he was sleeping on the chair and slid out of the chair and landed on the floor.  Patient denies any injury or hitting of the head and loss of consciousness.  Denies any pain and trauma. -Patient reported since the fall he has left-sided hip joint and left knee pain.   -Physical exam no evidence of limb shortening and bruising.  Nontender on palpation.  Patient is requesting for x-ray. - Obtaining x-ray of the left-sided hip joint, left-sided knee and pelvis.  Viral gastroenteritis-resolved -Patient and his wife reported ate hot a few days ago afterward develops nausea, vomiting and diarrhea which has been improved completely 1 day ago.   History of PE - Patient was treated with Xarelto in 2013.  Currently not on any anticoagulation.  History of prediabetes - Continue heart healthy carb modified diet and POC blood glucose check with mealtime.  Hyperkeratotic/corn lesion of right foot - Patient has an hyperkeratotic lesion of the right sided palmar aspect of the foot.  Reported he has been following podiatry outpatient and not happy with the management.  Requesting for inpatient podiatry care. - Please consult inpatient podiatry in the daytime for evaluation.   DVT prophylaxis:  Lovenox Code Status:  Full Code Diet: Heart healthy carb modified diet. Family Communication:   Family was present at bedside, at the time of interview. Opportunity was given to ask question and all  questions were answered satisfactorily.  Disposition Plan: Continue to monitor improvement of wheezing and shortness of breath and lactic acid level.  Blood cultures are pending. Consults: None indicative that this time Admission status:   Inpatient, Telemetry bed  Severity of Illness: The appropriate patient status for this patient is INPATIENT. Inpatient status is judged to be reasonable and necessary in order to provide the required intensity of service to ensure the patient's safety. The patient's presenting symptoms, physical exam findings, and initial radiographic and laboratory data in the context of their chronic comorbidities is felt to place them at high risk for further clinical deterioration. Furthermore, it is not anticipated that the patient will be medically stable for discharge from the hospital within 2 midnights of admission.   * I certify that at the point of admission it is my clinical judgment that the patient will require inpatient hospital care spanning beyond 2 midnights from the point of admission due to high intensity of service, high risk for further deterioration and high frequency of surveillance required.[name]    Tereasa Coop, MD [facility]  How to contact the [facility] Attending or Consulting provider 7A - 7P or covering provider during after hours 7P -7A, for this patient.  Check the care team in [facility] and look for a) attending/consulting TR[facility] provider listed and b) the [facility] team listed Log into [email or web address] and use [facility] universal password to access. If you do not have the password, please contact the hospital operator. Locate the [facility] provider you are looking for under [facility] and page to a number that you can be directly reached. If you still have difficulty reaching the provider, please page the [facility] (Director on Call) for the Hospitalists listed on amion for assistance.  04/12/2023, 1:00 AM

## 2023-04-12 NOTE — Care Management Obs Status (Signed)
 MEDICARE OBSERVATION STATUS NOTIFICATION   Patient Details  Name: [name] [name] MRN: [id number] Date of Birth: 01/18/1941   Medicare Observation Status Notification Given:  Yes    Otelia Santee, LCSW 04/12/2023, 4:29 PM

## 2023-04-13 DIAGNOSIS — L84 Corns and callosities: Secondary | ICD-10-CM | POA: Diagnosis not present

## 2023-04-13 DIAGNOSIS — B9789 Other viral agents as the cause of diseases classified elsewhere: Secondary | ICD-10-CM | POA: Diagnosis not present

## 2023-04-13 DIAGNOSIS — A4189 Other specified sepsis: Secondary | ICD-10-CM | POA: Diagnosis not present

## 2023-04-13 LAB — GLUCOSE, CAPILLARY
Glucose-Capillary: 134 mg/dL — ABNORMAL HIGH (ref 70–99)
Glucose-Capillary: 150 mg/dL — ABNORMAL HIGH (ref 70–99)

## 2023-04-13 MED ORDER — OSELTAMIVIR PHOSPHATE 75 MG PO CAPS: 75.0000 mg | ORAL_CAPSULE | Freq: Two times a day (BID) | ORAL | 0 refills | Status: AC

## 2023-04-13 MED ORDER — ALBUTEROL SULFATE HFA 108 (90 BASE) MCG/ACT IN AERS: 2.0000 | INHALATION_SPRAY | Freq: Four times a day (QID) | RESPIRATORY_TRACT | 0 refills | Status: AC | PRN

## 2023-04-13 MED ORDER — GUAIFENESIN ER 600 MG PO TB12: 600.0000 mg | ORAL_TABLET | Freq: Two times a day (BID) | ORAL | 0 refills | Status: AC

## 2023-04-13 MED ORDER — LEVALBUTEROL HCL 0.63 MG/3ML IN NEBU
0.6300 mg | INHALATION_SOLUTION | Freq: Three times a day (TID) | RESPIRATORY_TRACT | Status: DC
  Administered 2023-04-13 (×2): 0.63 mg via RESPIRATORY_TRACT
  Filled 2023-04-13 (×2): qty 3

## 2023-04-13 MED ORDER — PREDNISONE 20 MG PO TABS: 40.0000 mg | ORAL_TABLET | Freq: Every day | ORAL | 0 refills | Status: AC

## 2023-04-13 MED ORDER — AZITHROMYCIN 250 MG PO TABS
500.0000 mg | ORAL_TABLET | Freq: Once | ORAL | Status: AC
  Administered 2023-04-13: 500 mg via ORAL

## 2023-04-13 MED ORDER — PREDNISONE 20 MG PO TABS: 40.0000 mg | ORAL_TABLET | Freq: Every day | ORAL | Status: DC

## 2023-04-13 MED ORDER — IPRATROPIUM BROMIDE 0.02 % IN SOLN
0.5000 mg | Freq: Three times a day (TID) | RESPIRATORY_TRACT | Status: DC
  Administered 2023-04-13 (×2): 0.5 mg via RESPIRATORY_TRACT
  Filled 2023-04-13 (×2): qty 2.5

## 2023-04-13 NOTE — TOC Transition Note (Signed)
 Transition of Care [facility]) - Discharge Note   Patient Details  Name: [name] [name] MRN: [id number] Date of Birth: 1940/04/30  Transition of Care [facility] CM/SW Contact:  Otelia Santee, LCSW Phone Number: 04/13/2023, 2:04 PM   Clinical Narrative:    Pt to return home with spouse. Pt denies having HH in the past. Pt accepting of HHPT being arranged after some encouragement from his wife. Pt has no HHA preference. HHPT has been arranged with [facility]. HH order in place. Family to transport home.    Final next level of care: Home w Home Health Services Barriers to Discharge: No Barriers Identified   Patient Goals and CMS Choice Patient states their goals for this hospitalization and ongoing recovery are:: To return home CMS Medicare.gov Compare Post Acute Care list provided to:: Patient Choice offered to / list presented to : Patient [place] ownership interest in [facility].provided to::  (NA)    Discharge Placement                       Discharge Plan and Services Additional resources added to the After Visit Summary for                  DME Arranged: N/A DME Agency: NA       HH Arranged: PT HH Agency: [facility] Date [facility] Agency Contacted: 04/13/23 Time HH Agency Contacted: [phone] Representative spoke with at [facility] Agency: Cindie  Social Drivers of Health (SDOH) Interventions SDOH Screenings   Food Insecurity: No Food Insecurity (04/12/2023)  Housing: Low Risk  (04/12/2023)  Transportation Needs: Unknown (04/12/2023)  Utilities: Not At Risk (04/12/2023)  Social Connections: Socially Integrated (04/12/2023)  Tobacco Use: High Risk (04/12/2023)     Readmission Risk Interventions    04/13/2023    2:03 PM  Readmission Risk Prevention Plan  Post Dischage Appt Complete  Medication Screening Complete  Transportation Screening Complete

## 2023-04-13 NOTE — TOC CM/SW Note (Signed)

## 2023-04-13 NOTE — Evaluation (Signed)
 Physical Therapy Evaluation Patient Details Name: [name] [name] MRN: [id number] DOB: Nov 13, 1940 Today's Date: 04/13/2023  History of Present Illness  [name] [name] is a 83 y.o. male admitted 04/11/23 fro sepsis with medical history significant of COPD, hyperlipidemia, history of PE, AAA and prediabetic presented to emergency department complaining of mechanical fall and shortness of breath.  Clinical Impression  Pt admitted with above diagnosis. Pt sleeping in bed when PT arrives, agreeable to PT session, denies pain. Pt states that he has 2WW at home but has not been using it, states that he plans to once home. He is able to come to stand with bed heioght raised to reflect home setup, no assist required from PT. Walker height was adjusted followed by 124ft amb at sup A, pt limited most by endurance but meets requirements for home entry/exit distances and completes directional changes well. Upon return to the room, pt states that he needs to use restroom and is able to complete this task without safety concerns. Nursing notified of mobility status. Pt currently with functional limitations due to the deficits listed below (see PT Problem List). Pt will benefit from acute skilled PT to increase their independence and safety with mobility to allow discharge.           If plan is discharge home, recommend the following: A little help with bathing/dressing/bathroom;Assistance with cooking/housework;Assist for transportation   Can travel by private vehicle        Equipment Recommendations None recommended by PT  Recommendations for Other Services       Functional Status Assessment Patient has had a recent decline in their functional status and demonstrates the ability to make significant improvements in function in a reasonable and predictable amount of time.     Precautions / Restrictions Precautions Precautions: Fall Recall of Precautions/Restrictions: Intact Restrictions Weight Bearing  Restrictions Per Provider Order: No      Mobility  Bed Mobility Overal bed mobility: Modified Independent                  Transfers Overall transfer level: Modified independent Equipment used: Rolling walker (2 wheels)               General transfer comment: bed height elevated slightly to match home setup    Ambulation/Gait Ambulation/Gait assistance: Supervision Gait Distance (Feet): 125 Feet Assistive device: Rolling walker (2 wheels) Gait Pattern/deviations: Step-through pattern, Decreased stride length, Wide base of support Gait velocity: dec     General Gait Details: walker height adjusted for improved efficiency of BUE use and support during mobility  Stairs            Wheelchair Mobility     Tilt Bed    Modified Rankin (Stroke Patients Only)       Balance Overall balance assessment: Needs assistance Sitting-balance support: No upper extremity supported, Feet supported Sitting balance-Leahy Scale: Normal     Standing balance support: No upper extremity supported, During functional activity Standing balance-Leahy Scale: Fair                               Pertinent Vitals/Pain Pain Assessment Pain Assessment: No/denies pain    Home Living Family/patient expects to be discharged to:: Private residence Living Arrangements: Spouse/significant other Available Help at Discharge: Available PRN/intermittently;Family Type of Home: House Home Access: Level entry     Alternate Level Stairs-Number of Steps: pt has chair lift for 2nd level access  Home Layout: Two level Home Equipment: Agricultural consultant (2 wheels) Additional Comments: pt and his wife share chores at home, has not been using walker at home but plans to upon dc    Prior Function Prior Level of Function : Independent/Modified Independent                     Extremity/Trunk Assessment        Lower Extremity Assessment Lower Extremity Assessment:  Generalized weakness    Cervical / Trunk Assessment Cervical / Trunk Assessment: Kyphotic  Communication   Communication Communication: No apparent difficulties    Cognition Arousal: Alert Behavior During Therapy: WFL for tasks assessed/performed   PT - Cognitive impairments: No apparent impairments                         Following commands: Intact       Cueing Cueing Techniques: Verbal cues     General Comments General comments (skin integrity, edema, etc.): growth on plantar surface of R foot, pt denies pain and states that he can WB without difficulty    Exercises     Assessment/Plan    PT Assessment All further PT needs can be met in the next venue of care  PT Problem List Decreased strength;Decreased activity tolerance;Decreased mobility;Decreased balance       PT Treatment Interventions      PT Goals (Current goals can be found in the Care Plan section)  Acute Rehab PT Goals Patient Stated Goal: return home PT Goal Formulation: With patient Time For Goal Achievement: 04/27/23 Potential to Achieve Goals: Good    Frequency       Co-evaluation               AM-PAC PT "6 Clicks" Mobility  Outcome Measure Help needed turning from your back to your side while in a flat bed without using bedrails?: None Help needed moving from lying on your back to sitting on the side of a flat bed without using bedrails?: None Help needed moving to and from a bed to a chair (including a wheelchair)?: None Help needed standing up from a chair using your arms (e.g., wheelchair or bedside chair)?: None Help needed to walk in hospital room?: None Help needed climbing 3-5 steps with a railing? : A Little 6 Click Score: 23    End of Session Equipment Utilized During Treatment: Gait belt Activity Tolerance: Patient tolerated treatment well Patient left: Other (comment) (using bathroom) Nurse Communication: Mobility status PT Visit Diagnosis: Unsteadiness on  feet (R26.81);History of falling (Z91.81)    Time: 1207-1224 PT Time Calculation (min) (ACUTE ONLY): 17 min   Charges:   PT Evaluation $PT Eval Low Complexity: 1 Low   PT General Charges $$ ACUTE PT VISIT: 1 Visit         Madaline Guthrie, PT Acute Rehabilitation Services Office: [phone] 04/13/2023   Evelena Peat 04/13/2023, 12:37 PM

## 2023-04-13 NOTE — Plan of Care (Signed)

## 2023-04-13 NOTE — Consult Note (Signed)
 PODIATRY CONSULTATION  NAME [name] [name] MRN [id number] DOB July 26, 1940 DOA 04/11/2023   Reason for consult:  Chief Complaint  Patient presents with   Shortness of Breath   Fall    Attending/Consulting physician: A Adhikari  History of present illness: "73 male with history of COPD, hyperlipidemia, PE, aortic aneurysm who presented with complaint of shortness of breath, also fell at home. Also reported diarrhea. Presentation, he was tachycardic with heart rate in the range of 130, tachypneic, saturating fine on room air. Lab work showed elevated lactate of 5.1. Respiratory viral panel positive for influenza A. Lab work showed creatinine of 1.3. Chest x-ray did not show any acute disease. Patient was admitted for the management of influenza A with concurrent COPD exacerbation. "  Patient also reports a lesion on his right foot that he has previously seen Dr. Lilian Kapur for this was in March and April 2023.  Per last note there was consideration for biopsy of the lesion and it was debrided.  He wanted to hold off on excisional biopsy.  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  He reports it does cause some pain.  He says his son has been shaving it down at home.  He was unhappy with the care he received because he felt that Dr. Lilian Kapur should have gone to the core of the lesion and remove the entire lesion and was upset at the charge that he incurred from the office visits at that time.  He denies drainage from the area.  Past Medical History:  Diagnosis Date   BPH (benign prostatic hypertrophy)    Difficulty sleeping    Frequency of urination    History of DVT (deep vein thrombosis) JAN 2016   Lumbar disc disease    Nocturia    Osteoarthritis    PE (pulmonary embolism) JAN 2016       Latest Ref Rng & Units 04/12/2023    4:38 AM 04/11/2023   10:08 PM 03/01/2015   11:50 AM  CBC  WBC 4.0 - 10.5 K/uL 6.4  6.7  4.8   Hemoglobin 13.0 - 17.0 g/dL  08.6  57.8  46.9   Hematocrit 39.0 - 52.0 % 40.4  44.6  42.0   Platelets 150 - 400 K/uL 127  PLATELET CLUMPS NOTED ON SMEAR, UNABLE TO ESTIMATE  PLATELET CLUMPS NOTED ON SMEAR, COUNT APPEARS ADEQUATE        Latest Ref Rng & Units 04/12/2023    4:38 AM 04/11/2023   10:08 PM 03/01/2015   11:50 AM  BMP  Glucose 70 - 99 mg/dL 629  528  413   BUN 8 - 23 mg/dL 28  30  17    Creatinine 0.61 - 1.24 mg/dL 2.44  0.10  2.72   Sodium 135 - 145 mmol/L 137  138  138   Potassium 3.5 - 5.1 mmol/L 4.5  4.1  4.3   Chloride 98 - 111 mmol/L 104  100  105   CO2 22 - 32 mmol/L 25  24  24    Calcium 8.9 - 10.3 mg/dL 8.0  8.3  8.9       Physical Exam: Lower Extremity Exam Vasc: R - PT palpable, DP palpable. Cap refill < 3 sec to digits  L - PT palpable, DP palpable. Cap refill <3 sec to digits  Derm: R -hyperkeratotic lesion plantar medial aspect of the first MPJ.  No erythema or drainage   L - Normal temp/texture/turgor  with no open lesion or clinical signs of infection  MSK:  R -  No gross deformities. Compartments soft, non-tender, compressible  L -  No gross deformities. Compartments soft, non-tender, compressible  Neuro: R - Gross sensation intact. Gross motor function intact   L - Gross sensation intact. Gross motor function intact    ASSESSMENT/PLAN OF CARE 83 y.o. male with PMHx significant for  COPD, hyperlipidemia, PE, aortic aneurysm  with Hyperkeratotic lesion plantar medial aspect first MPJ right foot without evidence of infection  -Discussed with the patient that typically issues of this nature without concern for infection are treated outpatient.  I recommend outpatient clinic follow-up.  He states he does want to pay $325 for someone to shave it down and does not want to see Dr. Lilian Kapur and he feels that his son can continue to shave it down.  I recommended against this but it is his decision.  If he feels like it becomes a problem and wants to have it further evaluated he can call our  office to schedule follow-up -No antibiotics indicated in regards to this issue - Anticoagulation: Per primary - Wound care: None required - WB status: Weightbearing as tolerated - Will sign off at this time   Thank you for the consult.  Please contact me directly with any questions or concerns.           Corinna Gab, DPM [facility] / [facility]    [address] [place], [place] [address]                Office [phone]  Fax [phone]

## 2023-04-13 NOTE — Progress Notes (Signed)
 AVS reviewed w/ pt & wife - both verbalized an understanding. PIV x 2 removed as noted. Pt dressed for d/c to home. Pt to lobby via w/c - home w/ spouse

## 2023-04-13 NOTE — Discharge Summary (Signed)
 Physician Discharge Summary  [name] [name] NGE:[id number] DOB: 01/03/1941 DOA: 04/11/2023  PCP: Darrow Bussing, MD  Admit date: 04/11/2023 Discharge date: 04/13/2023  Admitted From: Home Disposition:  Home  Discharge Condition:Stable CODE STATUS:FULL Diet recommendation: regular  Brief/Interim Summary: Patient is a 59 male with history of COPD, hyperlipidemia, PE, aortic aneurysm who presented with complaint of shortness of breath, also fell at home.  Also reported diarrhea.  Presentation, he was tachycardic with heart rate in the range of 130, tachypneic, saturating fine on room air.  Lab work showed elevated lactate of 5.1.  Respiratory viral panel positive for influenza A.  Lab work showed creatinine of 1.3.  Chest x-ray did not show any acute disease.  Patient was admitted for the management of influenza A with concurrent COPD exacerbation.Patient seen and examined at bedside today. During my evaluation, he looked overall comfortable.  He has been on room air since yesterday.  Denies any worsening shortness of breath or cough.  Physical therapy evaluated him and recommend home health.  Medically stable for discharge home today    Following problems were addressed during the hospitalization:   Sepsis secondary to influenza A: Presented with nausea, vomiting, diarrhea, shortness of breath.  On presentation he was tachypneic, tachycardic with elevated lactate level, AKI.  Started on IV fluid.  Chest x-ray did not show any pneumonia.  Also started on azithromycin.  Continue Tamiflu and prednisone to finish the course   Severe lactacidosis : Lactic acid of 5.1 on presentation.  Resolved with IV fluid  COPD exacerbation: Started on steroids.  Currently on room air.  Continue albuterol inhaler on discharge.  We recommend to follow-up with pulmonology as an outpatient for PFT   Acute hypoxic respiratory failure: Secondary to influenza A, COPD exacerbation.    Tobacco use: Smokes half pack a  day.  Counseled cessation.   AKI: Resolved with IV fluid   History of PE: On Xarelto previously.  Not on anticoagulation right now   Fall at home: PT/OT consulted.  Knee x-ray/hip x-ray negative for acute findings.  HH recommended   Corn of right foot: Podiatry consult requested.  Outpatient follow-up recommended    Discharge Diagnoses:  Principal Problem:   Sepsis, viral (HCC) Active Problems:   COPD with acute exacerbation (HCC)   Infection due to novel influenza A virus   Benign prostatic hyperplasia   HLD (hyperlipidemia)   Hx of pulmonary embolus   Fall at home, initial encounter   Hyperkeratotic/corn lesion of the right foot   Sepsis (HCC)   Callus of foot    Discharge Instructions  Discharge Instructions     Diet general   Complete by: As directed    Discharge instructions   Complete by: As directed    1)Please take prescribed medications as instructed 2)Follow up with your PCP in a week   Increase activity slowly   Complete by: As directed       Allergies as of 04/13/2023   No Known Allergies      Medication List     STOP taking these medications    clobetasol ointment 0.05 % Commonly known as: TEMOVATE       TAKE these medications    guaiFENesin 600 MG 12 hr tablet Commonly known as: MUCINEX Take 1 tablet (600 mg total) by mouth 2 (two) times daily for 5 days.   oseltamivir 75 MG capsule Commonly known as: TAMIFLU Take 1 capsule (75 mg total) by mouth 2 (two) times daily for  3 days.   predniSONE 20 MG tablet Commonly known as: DELTASONE Take 2 tablets (40 mg total) by mouth daily with breakfast for 4 days. Start taking on: April 14, 2023        Follow-up Information     Koirala, Dibas, MD. Schedule an appointment as soon as possible for a visit in 1 week(s).   Specialty: Family Medicine Contact information: [address] [place] [address] [phone]                No Known  Allergies  Consultations: None   Procedures/Studies: DG Knee Left Port Result Date: 04/12/2023 CLINICAL DATA:  [id number] Fall [id number] EXAM: PORTABLE LEFT KNEE - 1-2 VIEW COMPARISON:  None Available. FINDINGS: Total left knee arthroplasty. No surgical hardware complication. No evidence of fracture, dislocation, or joint effusion. No evidence of arthropathy or other focal bone abnormality. Soft tissues are unremarkable. Vascular calcifications. IMPRESSION: Negative for acute traumatic injury. Electronically Signed   By: Tish Frederickson M.D.   On: 04/12/2023 02:08   DG HIP PORT UNILAT WITH PELVIS 1V LEFT Result Date: 04/12/2023 CLINICAL DATA:  [id number] Fall [id number] No recent injury, previous surgery, ongoing hip pain "for a long time" EXAM: DG HIP (WITH OR WITHOUT PELVIS) 1V PORT LEFT COMPARISON:  None Available. FINDINGS: Intramedullary nail fixation of left femur partially visualized. There is no evidence of hip fracture or dislocation. No acute displaced fracture or diastasis of the bones of the pelvis. There is no evidence of arthropathy or other focal bone abnormality. Vascular calcification. IMPRESSION: Negative for acute traumatic injury. Electronically Signed   By: Tish Frederickson M.D.   On: 04/12/2023 02:07   DG Chest Port 1 View Result Date: 04/11/2023 CLINICAL DATA:  Tachycardia sepsis EXAM: PORTABLE CHEST 1 VIEW COMPARISON:  09/17/2014 FINDINGS: Minimal atelectasis at the lingula. No focal consolidation, pleural effusion or pneumothorax. Aortic atherosclerosis IMPRESSION: No active disease. Minimal lingular atelectasis. Electronically Signed   By: Jasmine Pang M.D.   On: 04/11/2023 23:54      Subjective: Patient seen and examined at bedside today.  Hemodynamically stable for discharge.  On room air.  Any shortness of breath or cough.  Found to be weak.  PT saw him and recommended home health  Discharge Exam: Vitals:   04/13/23 0234 04/13/23 0806  BP: 131/77   Pulse: 90   Resp: 16    Temp: 98.8 F (37.1 C)   SpO2: 93% 94%   Vitals:   04/12/23 2028 04/12/23 2240 04/13/23 0234 04/13/23 0806  BP:  138/70 131/77   Pulse:  88 90   Resp:  16 16   Temp:  98.6 F (37 C) 98.8 F (37.1 C)   TempSrc:      SpO2: 93% 93% 93% 94%  Weight:      Height:        General: Pt is alert, awake, not in acute distress, pleasant elderly male Cardiovascular: RRR, S1/S2 +, no rubs, no gallops Respiratory: CTA bilaterally, no wheezing, no rhonchi Abdominal: Soft, NT, ND, bowel sounds + Extremities: no edema, no cyanosis    The results of significant diagnostics from this hospitalization (including imaging, microbiology, ancillary and laboratory) are listed below for reference.     Microbiology: Recent Results (from the past 240 hours)  Blood culture (routine x 2)     Status: None (Preliminary result)   Collection Time: 04/11/23 10:08 PM   Specimen: BLOOD  Result Value Ref Range Status   Specimen Description  Final    BLOOD RIGHT ANTECUBITAL Performed at [facility], [address] [place], [place] [address]    Special Requests   Final    BOTTLES DRAWN AEROBIC AND ANAEROBIC Blood Culture adequate volume Performed at [facility], [address], [place], [place] [address]    Culture   Final    NO GROWTH 2 DAYS Performed at [facility], [address], [place], [place] [address]    Report Status PENDING  Incomplete  Blood culture (routine x 2)     Status: None (Preliminary result)   Collection Time: 04/11/23 10:08 PM   Specimen: BLOOD LEFT HAND  Result Value Ref Range Status   Specimen Description   Final    BLOOD LEFT HAND Performed at [facility], [address] [place], [place] [address]    Special Requests   Final    BOTTLES DRAWN AEROBIC AND ANAEROBIC Blood Culture results may not be optimal due to an inadequate volume of blood received in culture bottles Performed at [facility], [address] [place], [place] [address]    Culture   Final    NO GROWTH 2 DAYS Performed at [facility], [address], [place], [place] [address]    Report Status PENDING  Incomplete  Resp panel by RT-PCR (RSV, Flu A&B, Covid) Anterior Nasal Swab     Status: Abnormal   Collection Time: 04/11/23 10:08 PM   Specimen: Anterior Nasal Swab  Result Value Ref Range Status   SARS Coronavirus 2 by RT PCR NEGATIVE NEGATIVE Final    Comment: (NOTE) SARS-CoV-2 target nucleic acids are NOT DETECTED.  The SARS-CoV-2 RNA is generally detectable in upper respiratory specimens during the acute phase of infection. The lowest concentration of SARS-CoV-2 viral copies this assay can detect is 138 copies/mL. A negative result does not preclude SARS-Cov-2 infection and should not be used as the sole basis for treatment or other patient management decisions. A negative result may occur with  improper specimen collection/handling, submission of specimen other than nasopharyngeal swab, presence of viral mutation(s) within the areas targeted by this assay, and inadequate number of viral copies(<138 copies/mL). A negative result must be combined with clinical observations, patient history, and epidemiological information. The expected result is Negative.  Fact Sheet for Patients:  BloggerCourse.com  Fact Sheet for Healthcare Providers:  SeriousBroker.it  This test is no t yet approved or cleared by the [place] FDA and  has been authorized for detection and/or diagnosis of SARS-CoV-2 by FDA under an Emergency Use Authorization (EUA). This EUA will remain  in effect (meaning this test can be used) for the duration of the COVID-19 declaration under Section 564(b)(1) of the Act, 21 U.S.C.section 360bbb-3(b)(1), unless the authorization is terminated  or revoked sooner.       Influenza A by PCR POSITIVE (A) NEGATIVE Final   Influenza B by PCR  NEGATIVE NEGATIVE Final    Comment: (NOTE) The Xpert Xpress SARS-CoV-2/FLU/RSV plus assay is intended as an aid in the diagnosis of influenza from Nasopharyngeal swab specimens and should not be used as a sole basis for treatment. Nasal washings and aspirates are unacceptable for Xpert Xpress SARS-CoV-2/FLU/RSV testing.  Fact Sheet for Patients: BloggerCourse.com  Fact Sheet for Healthcare Providers: SeriousBroker.it  This test is not yet approved or cleared by the [place] FDA and has been authorized for detection and/or diagnosis of SARS-CoV-2 by FDA under an Emergency Use  Authorization (EUA). This EUA will remain in effect (meaning this test can be used) for the duration of the COVID-19 declaration under Section 564(b)(1) of the Act, 21 U.S.C. section 360bbb-3(b)(1), unless the authorization is terminated or revoked.     Resp Syncytial Virus by PCR NEGATIVE NEGATIVE Final    Comment: (NOTE) Fact Sheet for Patients: BloggerCourse.com  Fact Sheet for Healthcare Providers: SeriousBroker.it  This test is not yet approved or cleared by the [place] FDA and has been authorized for detection and/or diagnosis of SARS-CoV-2 by FDA under an Emergency Use Authorization (EUA). This EUA will remain in effect (meaning this test can be used) for the duration of the COVID-19 declaration under Section 564(b)(1) of the Act, 21 U.S.C. section 360bbb-3(b)(1), unless the authorization is terminated or revoked.  Performed at [facility], [address] [place], [place] [address]      Labs: BNP (last 3 results) No results for input(s): "BNP" in the last 8760 hours. Basic Metabolic Panel: Recent Labs  Lab 04/11/23 2208 04/12/23 0438  NA 138 137  K 4.1 4.5  CL 100 104  CO2 24 25  GLUCOSE 182* 157*  BUN 30* 28*  CREATININE 1.30* 1.06  CALCIUM 8.3* 8.0*    Liver Function Tests: Recent Labs  Lab 04/11/23 2208 04/12/23 0438  AST 59* 61*  ALT 20 21  ALKPHOS 42 35*  BILITOT 0.7 0.9  PROT 7.9 6.9  ALBUMIN 3.4* 3.1*   No results for input(s): "LIPASE", "AMYLASE" in the last 168 hours. No results for input(s): "AMMONIA" in the last 168 hours. CBC: Recent Labs  Lab 04/11/23 2208 04/12/23 0438  WBC 6.7 6.4  NEUTROABS 5.7  --   HGB 14.1 12.7*  HCT 44.6 40.4  MCV 97.6 98.8  PLT PLATELET CLUMPS NOTED ON SMEAR, UNABLE TO ESTIMATE 127*   Cardiac Enzymes: No results for input(s): "CKTOTAL", "CKMB", "CKMBINDEX", "TROPONINI" in the last 168 hours. BNP: Invalid input(s): "POCBNP" CBG: Recent Labs  Lab 04/12/23 0256 04/12/23 1702 04/12/23 2133 04/13/23 0743 04/13/23 1142  GLUCAP 148* 139* 146* 134* 150*   D-Dimer No results for input(s): "DDIMER" in the last 72 hours. Hgb A1c No results for input(s): "HGBA1C" in the last 72 hours. Lipid Profile No results for input(s): "CHOL", "HDL", "LDLCALC", "TRIG", "CHOLHDL", "LDLDIRECT" in the last 72 hours. Thyroid function studies No results for input(s): "TSH", "T4TOTAL", "T3FREE", "THYROIDAB" in the last 72 hours.  Invalid input(s): "FREET3" Anemia work up No results for input(s): "VITAMINB12", "FOLATE", "FERRITIN", "TIBC", "IRON", "RETICCTPCT" in the last 72 hours. Urinalysis    Component Value Date/Time   COLORURINE AMBER (A) 04/11/2023 2355   APPEARANCEUR HAZY (A) 04/11/2023 2355   LABSPEC 1.028 04/11/2023 2355   PHURINE 5.0 04/11/2023 2355   GLUCOSEU NEGATIVE 04/11/2023 2355   HGBUR MODERATE (A) 04/11/2023 2355   BILIRUBINUR NEGATIVE 04/11/2023 2355   KETONESUR 5 (A) 04/11/2023 2355   PROTEINUR 100 (A) 04/11/2023 2355   UROBILINOGEN 1.0 11/09/2014 0951   NITRITE NEGATIVE 04/11/2023 2355   LEUKOCYTESUR NEGATIVE 04/11/2023 2355   Sepsis Labs Recent Labs  Lab 04/11/23 2208 04/12/23 0438  WBC 6.7 6.4   Microbiology Recent Results (from the past 240 hours)  Blood  culture (routine x 2)     Status: None (Preliminary result)   Collection Time: 04/11/23 10:08 PM   Specimen: BLOOD  Result Value Ref Range Status   Specimen Description   Final    BLOOD RIGHT ANTECUBITAL Performed at [facility], [address] [place], [place] [address]    Special Requests   Final    BOTTLES DRAWN AEROBIC AND ANAEROBIC Blood Culture adequate volume Performed at [facility], [address], [place], [place] [address]    Culture   Final    NO GROWTH 2 DAYS Performed at [facility], [address], [place], [place] [address]    Report Status PENDING  Incomplete  Blood culture (routine x 2)     Status: None (Preliminary result)   Collection Time: 04/11/23 10:08 PM   Specimen: BLOOD LEFT HAND  Result Value Ref Range Status   Specimen Description   Final    BLOOD LEFT HAND Performed at [facility], [address], [place], [place] [address]    Special Requests   Final    BOTTLES DRAWN AEROBIC AND ANAEROBIC Blood Culture results may not be optimal due to an inadequate volume of blood received in culture bottles Performed at [facility], [address] [place], [place] [address]    Culture   Final    NO GROWTH 2 DAYS Performed at [facility], [address] [place], [place] [address]    Report Status PENDING  Incomplete  Resp panel by RT-PCR (RSV, Flu A&B, Covid) Anterior Nasal Swab     Status: Abnormal   Collection Time: 04/11/23 10:08 PM   Specimen: Anterior Nasal Swab  Result Value Ref Range Status   SARS Coronavirus 2 by RT PCR NEGATIVE NEGATIVE Final    Comment: (NOTE) SARS-CoV-2 target nucleic acids are NOT DETECTED.  The SARS-CoV-2 RNA is generally detectable in upper respiratory specimens during the acute phase of infection. The lowest concentration of SARS-CoV-2 viral copies this assay can detect is 138 copies/mL. A negative result does not preclude SARS-Cov-2 infection  and should not be used as the sole basis for treatment or other patient management decisions. A negative result may occur with  improper specimen collection/handling, submission of specimen other than nasopharyngeal swab, presence of viral mutation(s) within the areas targeted by this assay, and inadequate number of viral copies(<138 copies/mL). A negative result must be combined with clinical observations, patient history, and epidemiological information. The expected result is Negative.  Fact Sheet for Patients:  BloggerCourse.com  Fact Sheet for Healthcare Providers:  SeriousBroker.it  This test is no t yet approved or cleared by the [place] FDA and  has been authorized for detection and/or diagnosis of SARS-CoV-2 by FDA under an Emergency Use Authorization (EUA). This EUA will remain  in effect (meaning this test can be used) for the duration of the COVID-19 declaration under Section 564(b)(1) of the Act, 21 U.S.C.section 360bbb-3(b)(1), unless the authorization is terminated  or revoked sooner.       Influenza A by PCR POSITIVE (A) NEGATIVE Final   Influenza B by PCR NEGATIVE NEGATIVE Final    Comment: (NOTE) The Xpert Xpress SARS-CoV-2/FLU/RSV plus assay is intended as an aid in the diagnosis of influenza from Nasopharyngeal swab specimens and should not be used as a sole basis for treatment. Nasal washings and aspirates are unacceptable for Xpert Xpress SARS-CoV-2/FLU/RSV testing.  Fact Sheet for Patients: BloggerCourse.com  Fact Sheet for Healthcare Providers: SeriousBroker.it  This test is not yet approved or cleared by the [place] FDA and has been authorized for detection and/or diagnosis of SARS-CoV-2 by FDA under an Emergency Use Authorization (EUA). This EUA will remain in effect (meaning this test can be used) for  the duration of the COVID-19  declaration under Section 564(b)(1) of the Act, 21 U.S.C. section 360bbb-3(b)(1), unless the authorization is terminated or revoked.     Resp Syncytial Virus by PCR NEGATIVE NEGATIVE Final    Comment: (NOTE) Fact Sheet for Patients: BloggerCourse.com  Fact Sheet for Healthcare Providers: SeriousBroker.it  This test is not yet approved or cleared by the [place] FDA and has been authorized for detection and/or diagnosis of SARS-CoV-2 by FDA under an Emergency Use Authorization (EUA). This EUA will remain in effect (meaning this test can be used) for the duration of the COVID-19 declaration under Section 564(b)(1) of the Act, 21 U.S.C. section 360bbb-3(b)(1), unless the authorization is terminated or revoked.  Performed at [facility], [address], [place], [place] [address]     Please note: You were cared for by a hospitalist during your hospital stay. Once you are discharged, your primary care physician will handle any further medical issues. Please note that NO REFILLS for any discharge medications will be authorized once you are discharged, as it is imperative that you return to your primary care physician (or establish a relationship with a primary care physician if you do not have one) for your post hospital discharge needs so that they can reassess your need for medications and monitor your lab values.    Time coordinating discharge: 40 minutes  SIGNED:   Burnadette Pop, MD  [facility] 04/13/2023, 1:29 PM Pager [phone]  If 7PM-7AM, please contact night-coverage [email or web address] Password [email or web address]

## 2023-04-16 LAB — CULTURE, BLOOD (ROUTINE X 2)
Culture: NO GROWTH
Culture: NO GROWTH
Special Requests: ADEQUATE

## 2023-04-19 DIAGNOSIS — A4189 Other specified sepsis: Secondary | ICD-10-CM | POA: Diagnosis not present

## 2023-04-19 DIAGNOSIS — J9601 Acute respiratory failure with hypoxia: Secondary | ICD-10-CM | POA: Diagnosis not present

## 2023-04-19 DIAGNOSIS — J9811 Atelectasis: Secondary | ICD-10-CM | POA: Diagnosis not present

## 2023-04-19 DIAGNOSIS — M170 Bilateral primary osteoarthritis of knee: Secondary | ICD-10-CM | POA: Diagnosis not present

## 2023-04-19 DIAGNOSIS — L84 Corns and callosities: Secondary | ICD-10-CM | POA: Diagnosis not present

## 2023-04-19 DIAGNOSIS — I719 Aortic aneurysm of unspecified site, without rupture: Secondary | ICD-10-CM | POA: Diagnosis not present

## 2023-04-19 DIAGNOSIS — E785 Hyperlipidemia, unspecified: Secondary | ICD-10-CM | POA: Diagnosis not present

## 2023-04-19 DIAGNOSIS — F17210 Nicotine dependence, cigarettes, uncomplicated: Secondary | ICD-10-CM | POA: Diagnosis not present

## 2023-04-19 DIAGNOSIS — Z86711 Personal history of pulmonary embolism: Secondary | ICD-10-CM | POA: Diagnosis not present

## 2023-04-19 DIAGNOSIS — J441 Chronic obstructive pulmonary disease with (acute) exacerbation: Secondary | ICD-10-CM | POA: Diagnosis not present

## 2023-04-24 DIAGNOSIS — J9601 Acute respiratory failure with hypoxia: Secondary | ICD-10-CM | POA: Diagnosis not present

## 2023-04-24 DIAGNOSIS — J441 Chronic obstructive pulmonary disease with (acute) exacerbation: Secondary | ICD-10-CM | POA: Diagnosis not present

## 2023-04-24 DIAGNOSIS — A4189 Other specified sepsis: Secondary | ICD-10-CM | POA: Diagnosis not present

## 2023-04-24 DIAGNOSIS — I719 Aortic aneurysm of unspecified site, without rupture: Secondary | ICD-10-CM | POA: Diagnosis not present

## 2023-04-25 DIAGNOSIS — R5381 Other malaise: Secondary | ICD-10-CM | POA: Diagnosis not present

## 2023-04-27 DIAGNOSIS — J9601 Acute respiratory failure with hypoxia: Secondary | ICD-10-CM | POA: Diagnosis not present

## 2023-04-27 DIAGNOSIS — A4189 Other specified sepsis: Secondary | ICD-10-CM | POA: Diagnosis not present

## 2023-04-27 DIAGNOSIS — I719 Aortic aneurysm of unspecified site, without rupture: Secondary | ICD-10-CM | POA: Diagnosis not present

## 2023-04-27 DIAGNOSIS — J441 Chronic obstructive pulmonary disease with (acute) exacerbation: Secondary | ICD-10-CM | POA: Diagnosis not present

## 2023-05-01 DIAGNOSIS — J441 Chronic obstructive pulmonary disease with (acute) exacerbation: Secondary | ICD-10-CM | POA: Diagnosis not present

## 2023-05-01 DIAGNOSIS — I719 Aortic aneurysm of unspecified site, without rupture: Secondary | ICD-10-CM | POA: Diagnosis not present

## 2023-05-01 DIAGNOSIS — A4189 Other specified sepsis: Secondary | ICD-10-CM | POA: Diagnosis not present

## 2023-05-01 DIAGNOSIS — J9601 Acute respiratory failure with hypoxia: Secondary | ICD-10-CM | POA: Diagnosis not present

## 2023-05-03 DIAGNOSIS — J441 Chronic obstructive pulmonary disease with (acute) exacerbation: Secondary | ICD-10-CM | POA: Diagnosis not present

## 2023-05-03 DIAGNOSIS — A4189 Other specified sepsis: Secondary | ICD-10-CM | POA: Diagnosis not present

## 2023-05-03 DIAGNOSIS — I719 Aortic aneurysm of unspecified site, without rupture: Secondary | ICD-10-CM | POA: Diagnosis not present

## 2023-05-03 DIAGNOSIS — J9601 Acute respiratory failure with hypoxia: Secondary | ICD-10-CM | POA: Diagnosis not present

## 2023-05-07 DIAGNOSIS — J9601 Acute respiratory failure with hypoxia: Secondary | ICD-10-CM | POA: Diagnosis not present

## 2023-05-07 DIAGNOSIS — J441 Chronic obstructive pulmonary disease with (acute) exacerbation: Secondary | ICD-10-CM | POA: Diagnosis not present

## 2023-05-07 DIAGNOSIS — A4189 Other specified sepsis: Secondary | ICD-10-CM | POA: Diagnosis not present

## 2023-05-07 DIAGNOSIS — I719 Aortic aneurysm of unspecified site, without rupture: Secondary | ICD-10-CM | POA: Diagnosis not present

## 2023-05-10 DIAGNOSIS — J441 Chronic obstructive pulmonary disease with (acute) exacerbation: Secondary | ICD-10-CM | POA: Diagnosis not present

## 2023-05-10 DIAGNOSIS — A4189 Other specified sepsis: Secondary | ICD-10-CM | POA: Diagnosis not present

## 2023-05-10 DIAGNOSIS — J9601 Acute respiratory failure with hypoxia: Secondary | ICD-10-CM | POA: Diagnosis not present

## 2023-05-10 DIAGNOSIS — I719 Aortic aneurysm of unspecified site, without rupture: Secondary | ICD-10-CM | POA: Diagnosis not present

## 2023-05-15 DIAGNOSIS — J9601 Acute respiratory failure with hypoxia: Secondary | ICD-10-CM | POA: Diagnosis not present

## 2023-05-15 DIAGNOSIS — I719 Aortic aneurysm of unspecified site, without rupture: Secondary | ICD-10-CM | POA: Diagnosis not present

## 2023-05-15 DIAGNOSIS — J441 Chronic obstructive pulmonary disease with (acute) exacerbation: Secondary | ICD-10-CM | POA: Diagnosis not present

## 2023-05-15 DIAGNOSIS — A4189 Other specified sepsis: Secondary | ICD-10-CM | POA: Diagnosis not present

## 2023-07-30 DIAGNOSIS — E7800 Pure hypercholesterolemia, unspecified: Secondary | ICD-10-CM | POA: Diagnosis not present

## 2023-07-30 DIAGNOSIS — J449 Chronic obstructive pulmonary disease, unspecified: Secondary | ICD-10-CM | POA: Diagnosis not present

## 2023-08-30 DIAGNOSIS — E7800 Pure hypercholesterolemia, unspecified: Secondary | ICD-10-CM | POA: Diagnosis not present

## 2023-08-30 DIAGNOSIS — J449 Chronic obstructive pulmonary disease, unspecified: Secondary | ICD-10-CM | POA: Diagnosis not present

## 2023-09-30 DIAGNOSIS — E7800 Pure hypercholesterolemia, unspecified: Secondary | ICD-10-CM | POA: Diagnosis not present

## 2023-09-30 DIAGNOSIS — J449 Chronic obstructive pulmonary disease, unspecified: Secondary | ICD-10-CM | POA: Diagnosis not present

## 2023-10-30 DIAGNOSIS — E7800 Pure hypercholesterolemia, unspecified: Secondary | ICD-10-CM | POA: Diagnosis not present

## 2023-10-30 DIAGNOSIS — J449 Chronic obstructive pulmonary disease, unspecified: Secondary | ICD-10-CM | POA: Diagnosis not present

## 2024-02-25 IMAGING — DX DG SHOULDER 2+V*L*
3 series · 3 of 3 positions shown · non-contrast
Comparison: None.

CLINICAL DATA: Fall, pain

EXAM:
LEFT SHOULDER - 2+ VIEW

[shoulder ap]
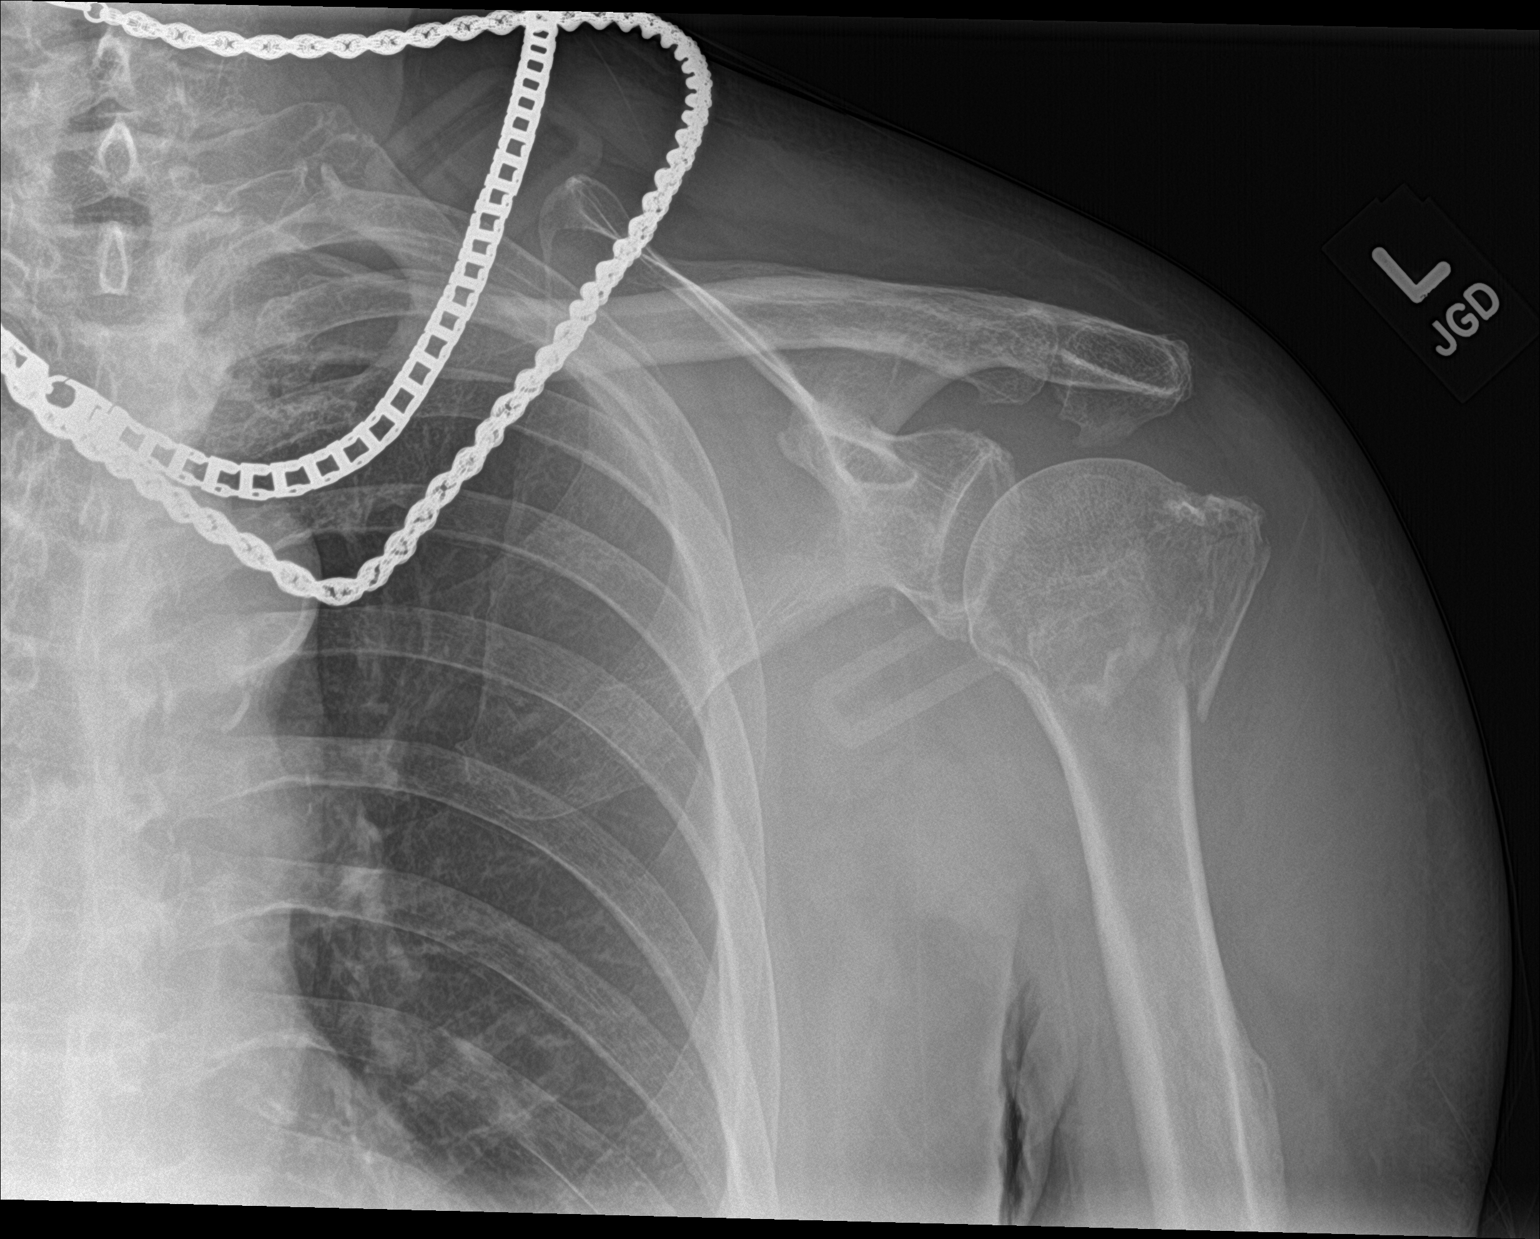

[shoulder grashey]
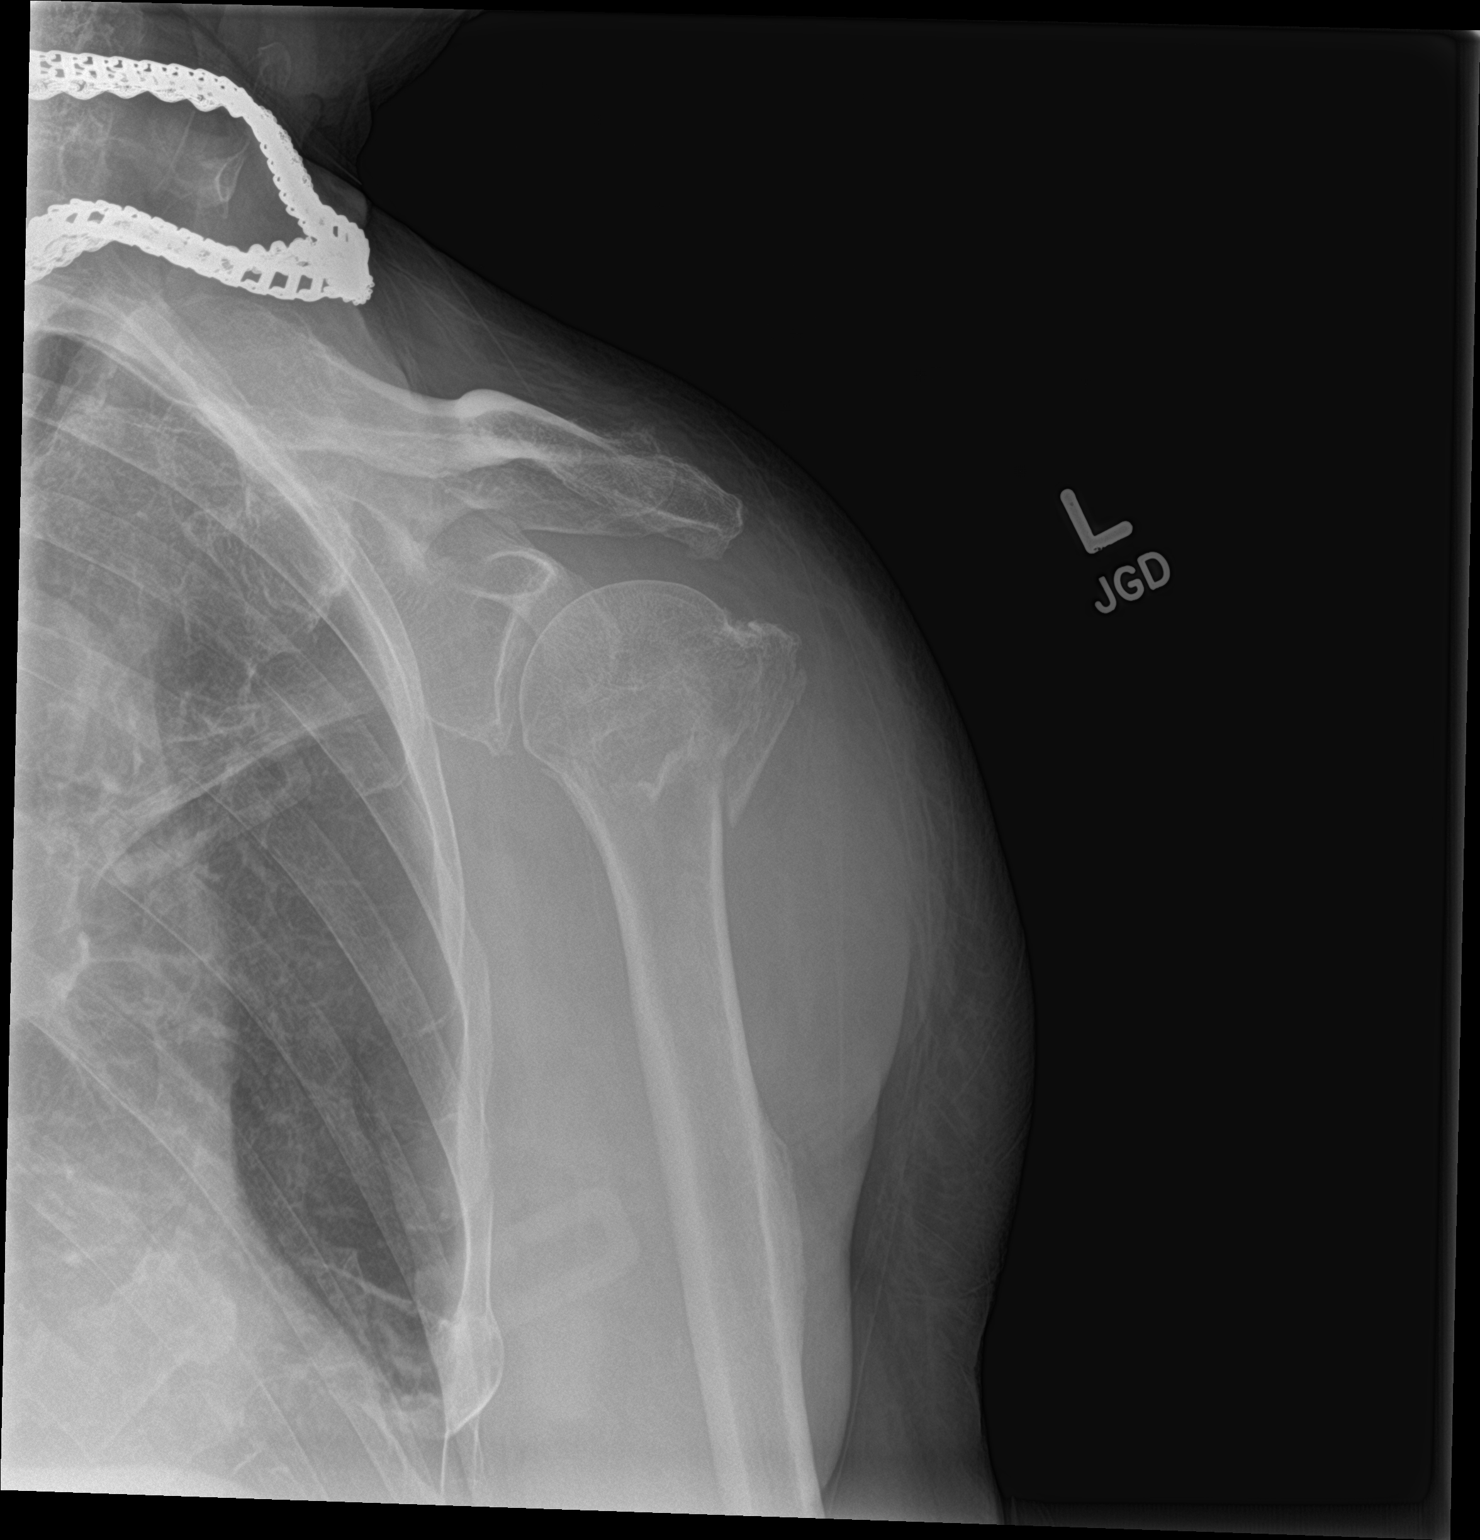

[shoulder y-view]
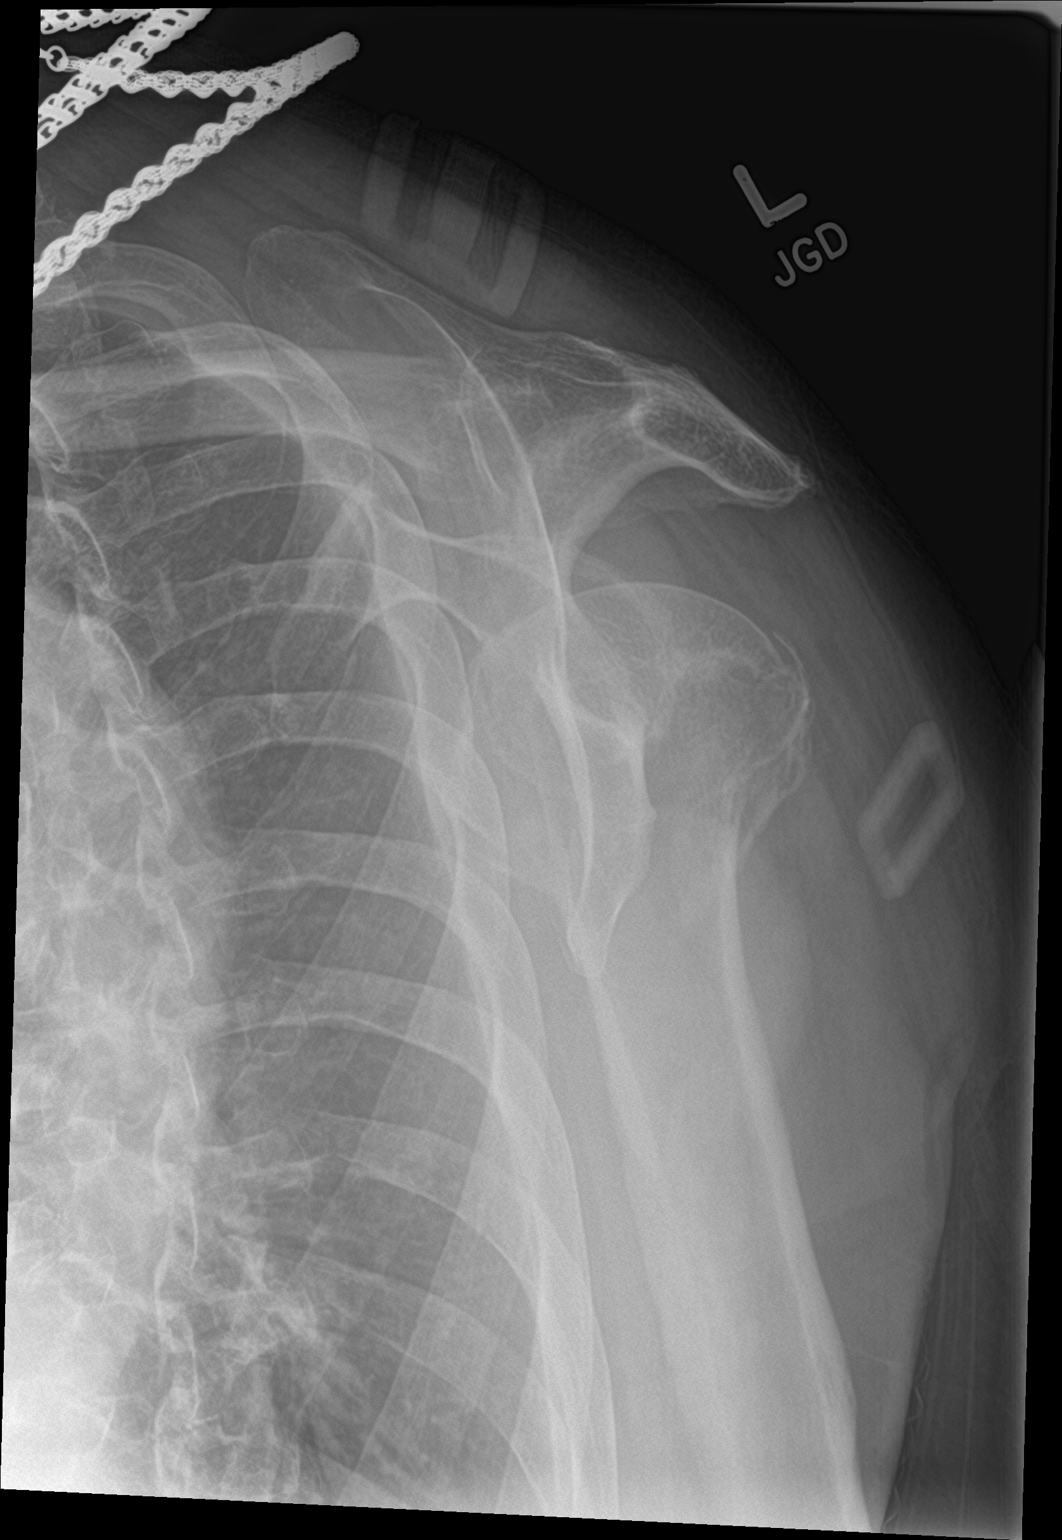

[3 of 3 positions shown; findings below may reference images not displayed]

FINDINGS: Mildly displaced, impacted fracture of the surgical neck of the left
humerus and likely the greater tuberosity. Mild acromioclavicular
and glenohumeral arthrosis. Partially imaged left chest is
unremarkable.
IMPRESSION: Mildly displaced, impacted fracture of the surgical neck of the left
humerus and likely the greater tuberosity. Consider CT to further
evaluate proximal humerus fracture anatomy.
